# Patient Record
Sex: Female | Born: 1985
Health system: Southern US, Community
[De-identification: ages and names within clinical notes are randomized; demographics above are authoritative.]

## PROBLEM LIST (undated history)

## (undated) DIAGNOSIS — Z8742 Personal history of other diseases of the female genital tract: Secondary | ICD-10-CM

## (undated) DIAGNOSIS — Z803 Family history of malignant neoplasm of breast: Secondary | ICD-10-CM

## (undated) DIAGNOSIS — I499 Cardiac arrhythmia, unspecified: Secondary | ICD-10-CM

## (undated) DIAGNOSIS — R2242 Localized swelling, mass and lump, left lower limb: Secondary | ICD-10-CM

## (undated) DIAGNOSIS — R87629 Unspecified abnormal cytological findings in specimens from vagina: Secondary | ICD-10-CM

## (undated) DIAGNOSIS — Z808 Family history of malignant neoplasm of other organs or systems: Secondary | ICD-10-CM

## (undated) HISTORY — DX: Family history of malignant neoplasm of other organs or systems: Z80.8

## (undated) HISTORY — PX: MYRINGOTOMY: SUR874

## (undated) HISTORY — DX: Family history of malignant neoplasm of breast: Z80.3

## (undated) HISTORY — DX: Unspecified abnormal cytological findings in specimens from vagina: R87.629

## (undated) HISTORY — DX: Cardiac arrhythmia, unspecified: I49.9

## (undated) HISTORY — PX: COSMETIC SURGERY: SHX468

---
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
## 2005-10-24 ENCOUNTER — Emergency Department (HOSPITAL_COMMUNITY): Admission: EM | Admit: 2005-10-24 | Discharge: 2005-10-24 | Payer: Self-pay | Admitting: Family Medicine

## 2005-11-08 ENCOUNTER — Emergency Department (HOSPITAL_COMMUNITY): Admission: EM | Admit: 2005-11-08 | Discharge: 2005-11-08 | Payer: Self-pay | Admitting: Family Medicine

## 2006-03-20 ENCOUNTER — Emergency Department (HOSPITAL_COMMUNITY): Admission: EM | Admit: 2006-03-20 | Discharge: 2006-03-20 | Payer: Self-pay | Admitting: Family Medicine

## 2007-02-12 ENCOUNTER — Ambulatory Visit (HOSPITAL_COMMUNITY): Admission: RE | Admit: 2007-02-12 | Discharge: 2007-02-12 | Payer: Self-pay | Admitting: Family Medicine

## 2008-12-01 HISTORY — PX: ANTERIOR CRUCIATE LIGAMENT REPAIR: SHX115

## 2010-02-27 ENCOUNTER — Encounter: Admission: RE | Admit: 2010-02-27 | Discharge: 2010-02-27 | Payer: Self-pay | Admitting: Obstetrics and Gynecology

## 2011-08-07 ENCOUNTER — Inpatient Hospital Stay (INDEPENDENT_AMBULATORY_CARE_PROVIDER_SITE_OTHER)
Admission: RE | Admit: 2011-08-07 | Discharge: 2011-08-07 | Disposition: A | Discharge status: Home / Self Care | Payer: 59 | Source: Ambulatory Visit | Attending: Family Medicine | Admitting: Family Medicine

## 2011-08-07 DIAGNOSIS — H814 Vertigo of central origin: Secondary | ICD-10-CM

## 2011-08-07 LAB — POCT I-STAT, CHEM 8
BUN: 12 mg/dL (ref 6–23)
Calcium, Ion: 1.19 mmol/L (ref 1.12–1.32)
Chloride: 102 mEq/L (ref 96–112)
Creatinine, Ser: 0.9 mg/dL (ref 0.50–1.10)
Glucose, Bld: 91 mg/dL (ref 70–99)
HCT: 48 % — ABNORMAL HIGH (ref 36.0–46.0)
Hemoglobin: 16.3 g/dL — ABNORMAL HIGH (ref 12.0–15.0)
Potassium: 4.1 mEq/L (ref 3.5–5.1)
Sodium: 138 mEq/L (ref 135–145)
TCO2: 24 mmol/L (ref 0–100)

## 2011-08-07 LAB — T4: T4, Total: 10.3 ug/dL (ref 5.0–12.5)

## 2011-08-14 ENCOUNTER — Other Ambulatory Visit (HOSPITAL_COMMUNITY): Payer: Self-pay | Admitting: Family Medicine

## 2011-08-14 DIAGNOSIS — R42 Dizziness and giddiness: Secondary | ICD-10-CM

## 2011-08-19 ENCOUNTER — Ambulatory Visit (HOSPITAL_COMMUNITY)
Admission: RE | Admit: 2011-08-19 | Discharge: 2011-08-19 | Disposition: A | Discharge status: Home / Self Care | Payer: 59 | Source: Ambulatory Visit | Attending: Family Medicine | Admitting: Family Medicine

## 2011-08-19 DIAGNOSIS — R42 Dizziness and giddiness: Secondary | ICD-10-CM | POA: Insufficient documentation

## 2011-08-19 DIAGNOSIS — R110 Nausea: Secondary | ICD-10-CM | POA: Insufficient documentation

## 2011-09-16 ENCOUNTER — Ambulatory Visit (HOSPITAL_BASED_OUTPATIENT_CLINIC_OR_DEPARTMENT_OTHER)
Admission: RE | Admit: 2011-09-16 | Discharge: 2011-09-16 | Disposition: A | Discharge status: Home / Self Care | Payer: 59 | Source: Ambulatory Visit | Attending: Specialist | Admitting: Specialist

## 2011-09-16 DIAGNOSIS — X58XXXA Exposure to other specified factors, initial encounter: Secondary | ICD-10-CM | POA: Insufficient documentation

## 2011-09-16 DIAGNOSIS — S83006A Unspecified dislocation of unspecified patella, initial encounter: Secondary | ICD-10-CM | POA: Insufficient documentation

## 2011-09-16 DIAGNOSIS — M2390 Unspecified internal derangement of unspecified knee: Secondary | ICD-10-CM | POA: Insufficient documentation

## 2011-09-16 DIAGNOSIS — M2240 Chondromalacia patellae, unspecified knee: Secondary | ICD-10-CM | POA: Insufficient documentation

## 2011-09-16 HISTORY — PX: KNEE ARTHROSCOPY W/ LATERAL RELEASE: SHX1873

## 2011-09-16 LAB — POCT HEMOGLOBIN-HEMACUE: Hemoglobin: 13.6 g/dL (ref 12.0–15.0)

## 2011-09-16 LAB — POCT PREGNANCY, URINE: Preg Test, Ur: NEGATIVE

## 2011-10-03 NOTE — Op Note (Signed)
  NAMERAYGEN, LINQUIST              ACCOUNT NO.:  [id number]  MEDICAL RECORD NO.:  [id number]  LOCATION:                                 FACILITY:  PHYSICIAN:  Erasmo Leventhal, M.D.DATE OF BIRTH:  1986-11-04  DATE OF PROCEDURE:  09/16/2011 DATE OF DISCHARGE:                              OPERATIVE REPORT   PREOPERATIVE DIAGNOSIS:  Left knee patellofemoral tracking and possible chondromalacia.  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  PROCEDURE:  Left knee arthroplasty lateral release.  SURGEON:  Erasmo Leventhal, MD  ASSISTANT:  Jamelle Rushing, PA  ANESTHESIA:  Knee block with general.  ESTIMATED BLOOD LOSS:  Less than 10 cc.  DRAINS:  None.  COMPLICATIONS:  None.  TOURNIQUET TIME:  None.  DISPOSITION:  PACU, stable.  OPERATIVE DETAILS:  The patient and family were counseled in the holding area, the correct side was identified and marked appropriately.  Chart was reviewed and signed appropriately.  IV was started and block was administered by the anesthesiologist.  On the way to operating room, the antibiotics were given.  She also had TED hose applied to the involved leg for DVT prophylaxis.  In the OR, she was placed under general anesthesia.  The left lower extremity was placed in a thigh holder, prepped with DuraPrep and draped in sterile fashion.  Standard time-out was done and confirmed by all in the room.  Proximal medial, inferomedial, and inferolateral portals were established.  Diagnostic arthroscopy revealed patella was tilting laterally significantly and about 3 mm of mild subluxation.  She now seemed to be unstable laterally.  There was some grade 2 chondromalacia at the femoral trochlea and patella, but with no unstable fronds.  ACL graft appeared to be excellent from previous surgery.  Mid compartment was inspected __________ normal lateral meniscus.  After  identifying the pathology, an arthroscopic lateral release was performed __________ synovial capsule retinaculum given anatomic patellofemoral tracking, pump pressure was decreased and hemostasis was obtained.  The arthroscopic equipment was removed, the portal was closed with suture.  A 10 cc of 0.25% Sensorcaine with epinephrine was added to the skin and another 10 cc to the knee joint with 4 mL of morphine sulfate.  Sterile dressing was applied along the lateral borders with TED hose, ice pack and knee immobilizer.  She was awakened and taken out of the operating room to PACU in stable condition.  She will be stabilized in the PACU and discharged home.          ______________________________ Erasmo Leventhal, M.D.     RAC/MEDQ  D:  09/16/2011  T:  09/17/2011  Job:  [id number]  Electronically Signed by Eugenia Mcalpine M.D. on 10/03/2011 11:45:33 AM

## 2011-10-29 ENCOUNTER — Encounter: Payer: 59 | Admitting: Physical Therapy

## 2012-05-10 ENCOUNTER — Ambulatory Visit (INDEPENDENT_AMBULATORY_CARE_PROVIDER_SITE_OTHER): Payer: 59 | Admitting: Obstetrics and Gynecology

## 2012-05-10 ENCOUNTER — Encounter: Payer: Self-pay | Admitting: Obstetrics and Gynecology

## 2012-05-10 VITALS — BP 104/58 | Ht 68.0 in | Wt 120.0 lb

## 2012-05-10 DIAGNOSIS — R87610 Atypical squamous cells of undetermined significance on cytologic smear of cervix (ASC-US): Secondary | ICD-10-CM

## 2012-05-10 DIAGNOSIS — IMO0001 Reserved for inherently not codable concepts without codable children: Secondary | ICD-10-CM

## 2012-05-10 DIAGNOSIS — Z309 Encounter for contraceptive management, unspecified: Secondary | ICD-10-CM

## 2012-05-10 NOTE — Progress Notes (Signed)
Pt here for f/u for b/c. Working well. Also has questions regarding gardasil and having abnormal paps. States that last two pap's have came back abnormal.   followup visit: The patient is currently on her fourth cycle of oral contraceptive pills and has been doing well. She has no intermenstrual bleeding. She has had very light and predictable withdrawal bleeding at the appropriate time. She is experiencing no nausea vomiting headaches chest pain leg pain and has not had significant weight gain. The patient has had 2 consecutive annual Pap smears showing atypical squamous cells of undetermined significant with negative high-risk HPV.  BP 104/58  Ht 5\' 8"  (1.727 m)  Wt 120 lb (54.432 kg)  BMI 18.25 kg/m2  LMP 05/04/2012  Impression: Doing well on oral contraceptive pill ASCUS negative HPV  Recommendation Continue oral contraceptive pill Consider Gardisil vaccination as I recommend for all patients between the ages of 10 and 55 Followup for annual

## 2012-05-12 DIAGNOSIS — Z309 Encounter for contraceptive management, unspecified: Secondary | ICD-10-CM | POA: Insufficient documentation

## 2012-05-12 DIAGNOSIS — IMO0001 Reserved for inherently not codable concepts without codable children: Secondary | ICD-10-CM | POA: Insufficient documentation

## 2013-01-31 ENCOUNTER — Other Ambulatory Visit: Payer: Self-pay | Admitting: Obstetrics and Gynecology

## 2013-01-31 MED ORDER — LEVONORGESTREL-ETHINYL ESTRAD 0.1-20 MG-MCG PO TABS: 1.0000 | ORAL_TABLET | Freq: Every day | ORAL | Status: DC

## 2013-01-31 NOTE — Telephone Encounter (Signed)
Tc to pt regarding msg left on vm that she is needing a refill of her BCPs and has an AEX scheduled for 02/24/13 w/ EP.  Lm on vm to call back.

## 2013-07-02 ENCOUNTER — Emergency Department (HOSPITAL_BASED_OUTPATIENT_CLINIC_OR_DEPARTMENT_OTHER): Payer: 59

## 2013-07-02 ENCOUNTER — Emergency Department (HOSPITAL_BASED_OUTPATIENT_CLINIC_OR_DEPARTMENT_OTHER)
Admission: EM | Admit: 2013-07-02 | Discharge: 2013-07-02 | Disposition: A | Discharge status: Home / Self Care | Payer: 59 | Attending: Emergency Medicine | Admitting: Emergency Medicine

## 2013-07-02 ENCOUNTER — Encounter (HOSPITAL_BASED_OUTPATIENT_CLINIC_OR_DEPARTMENT_OTHER): Payer: Self-pay | Admitting: *Deleted

## 2013-07-02 DIAGNOSIS — R05 Cough: Secondary | ICD-10-CM | POA: Insufficient documentation

## 2013-07-02 DIAGNOSIS — J209 Acute bronchitis, unspecified: Secondary | ICD-10-CM | POA: Insufficient documentation

## 2013-07-02 DIAGNOSIS — J40 Bronchitis, not specified as acute or chronic: Secondary | ICD-10-CM

## 2013-07-02 DIAGNOSIS — Z8742 Personal history of other diseases of the female genital tract: Secondary | ICD-10-CM | POA: Insufficient documentation

## 2013-07-02 DIAGNOSIS — R059 Cough, unspecified: Secondary | ICD-10-CM | POA: Insufficient documentation

## 2013-07-02 MED ORDER — HYDROCOD POLST-CHLORPHEN POLST 10-8 MG/5ML PO LQCR: 5.0000 mL | Freq: Two times a day (BID) | ORAL | Status: DC

## 2013-07-02 MED ORDER — AZITHROMYCIN 250 MG PO TABS: ORAL_TABLET | ORAL | Status: DC

## 2013-07-02 NOTE — ED Provider Notes (Signed)
CSN: [id number]     Arrival date & time 07/02/13  1545 History     First MD Initiated Contact with Patient 07/02/13 1613     Chief Complaint  Patient presents with  . Shortness of Breath   (Consider location/radiation/quality/duration/timing/severity/associated sxs/prior Treatment) Patient is a 27 y.o. female presenting with shortness of breath. The history is provided by the patient. No language interpreter was used.  Shortness of Breath Severity:  Moderate Onset quality:  Gradual Duration:  4 days Timing:  Constant Progression:  Worsening Chronicity:  New Relieved by:  Nothing Worsened by:  Nothing tried Ineffective treatments:  None tried Associated symptoms: cough     Past Medical History  Diagnosis Date  . Abnormal Pap smear   . Mastodynia   . Ovarian cyst    Past Surgical History  Procedure Laterality Date  . Tonsilectomy, adenoidectomy, bilateral myringotomy and tubes    . Anterior cruciate ligament repair    . Knee arthroscopy w/ lateral release     Family History  Problem Relation Age of Onset  . Cancer Maternal Grandmother     breast  . Cancer Maternal Grandfather     melanoma  . Hypertension Father   . Anemia Mother   . Hypertension Brother    History  Substance Use Topics  . Smoking status: Never Smoker   . Smokeless tobacco: Never Used  . Alcohol Use: No   OB History   Grav Para Term Preterm Abortions TAB SAB Ect Mult Living   0 0 0 0 0 0 0 0 0 0      Review of Systems  Respiratory: Positive for cough and shortness of breath.   All other systems reviewed and are negative.    Allergies  Review of patient's allergies indicates no known allergies.  Home Medications   Current Outpatient Rx  Name  Route  Sig  Dispense  Refill  . AMBULATORY NON FORMULARY MEDICATION      Medication Name: otc hydroxy-cut         . levonorgestrel-ethinyl estradiol (AVIANE,ALESSE,LESSINA) 0.1-20 MG-MCG tablet   Oral   Take 1 tablet by mouth daily.   1  Package   0    BP 115/76  Pulse 100  Temp(Src) 98.7 F (37.1 C) (Oral)  Resp 18  Ht 5\' 8"  (1.727 m)  Wt 130 lb (58.968 kg)  BMI 19.77 kg/m2  SpO2 100%  LMP 06/26/2013 Physical Exam  Nursing note and vitals reviewed. Constitutional: She is oriented to person, place, and time. She appears well-developed and well-nourished.  HENT:  Head: Normocephalic.  Right Ear: External ear normal.  Left Ear: External ear normal.  Mouth/Throat: Oropharynx is clear and moist.  Eyes: Pupils are equal, round, and reactive to light.  Neck: Normal range of motion.  Cardiovascular: Normal rate.   Pulmonary/Chest: Effort normal and breath sounds normal.  Abdominal: Soft. Bowel sounds are normal.  Musculoskeletal: Normal range of motion.  Neurological: She is alert and oriented to person, place, and time. She has normal reflexes.  Skin: Skin is warm.  Psychiatric: She has a normal mood and affect.    ED Course   Procedures (including critical care time)  Labs Reviewed - No data to display Dg Chest 2 View  07/02/2013   *RADIOLOGY REPORT*  Clinical Data: Mid chest pain, shortness of breath  CHEST - 2 VIEW  Comparison: None.  Findings: Lungs are clear. No pleural effusion or pneumothorax.  Cardiomediastinal silhouette is within normal limits.  Visualized osseous  structures are within normal limits.  IMPRESSION: No evidence of acute cardiopulmonary disease.   Original Report Authenticated By: Charline Bills, M.D.   1. Bronchitis     MDM  z pack and tussionex  Elson Areas, PA-C 07/02/13 1752  Lonia Skinner [place], [place] 07/02/13 1752

## 2013-07-02 NOTE — ED Notes (Signed)
Patient states that she has been short of breath for about 4 days. Mild cough, lightheaded.

## 2013-07-02 NOTE — ED Provider Notes (Signed)
Medical screening examination/treatment/procedure(s) were performed by non-physician practitioner and as supervising physician I was immediately available for consultation/collaboration.  Shann Lewellyn, MD 07/02/13 1837 

## 2013-07-02 NOTE — ED Notes (Signed)
Patient transported to X-ray 

## 2013-10-20 ENCOUNTER — Ambulatory Visit: Payer: 59 | Attending: Specialist | Admitting: Physical Therapy

## 2013-10-20 DIAGNOSIS — M25569 Pain in unspecified knee: Secondary | ICD-10-CM | POA: Insufficient documentation

## 2013-10-20 DIAGNOSIS — IMO0001 Reserved for inherently not codable concepts without codable children: Secondary | ICD-10-CM | POA: Insufficient documentation

## 2013-10-24 ENCOUNTER — Ambulatory Visit: Payer: 59

## 2013-10-26 ENCOUNTER — Ambulatory Visit: Payer: 59 | Admitting: Physical Therapy

## 2013-10-31 ENCOUNTER — Ambulatory Visit: Payer: 59 | Attending: Specialist | Admitting: Physical Therapy

## 2013-10-31 DIAGNOSIS — M25569 Pain in unspecified knee: Secondary | ICD-10-CM | POA: Insufficient documentation

## 2013-10-31 DIAGNOSIS — IMO0001 Reserved for inherently not codable concepts without codable children: Secondary | ICD-10-CM | POA: Insufficient documentation

## 2013-11-02 ENCOUNTER — Ambulatory Visit: Payer: 59 | Admitting: Physical Therapy

## 2013-11-09 ENCOUNTER — Ambulatory Visit: Payer: 59 | Admitting: Physical Therapy

## 2013-11-16 ENCOUNTER — Other Ambulatory Visit (HOSPITAL_COMMUNITY): Payer: Self-pay | Admitting: Specialist

## 2013-11-16 DIAGNOSIS — M25562 Pain in left knee: Secondary | ICD-10-CM

## 2013-11-22 ENCOUNTER — Encounter (HOSPITAL_BASED_OUTPATIENT_CLINIC_OR_DEPARTMENT_OTHER): Payer: Self-pay | Admitting: Emergency Medicine

## 2013-11-22 ENCOUNTER — Emergency Department (HOSPITAL_BASED_OUTPATIENT_CLINIC_OR_DEPARTMENT_OTHER)
Admission: EM | Admit: 2013-11-22 | Discharge: 2013-11-22 | Disposition: A | Discharge status: Home / Self Care | Payer: 59 | Attending: Emergency Medicine | Admitting: Emergency Medicine

## 2013-11-22 ENCOUNTER — Emergency Department (HOSPITAL_BASED_OUTPATIENT_CLINIC_OR_DEPARTMENT_OTHER): Payer: 59

## 2013-11-22 DIAGNOSIS — W268XXA Contact with other sharp object(s), not elsewhere classified, initial encounter: Secondary | ICD-10-CM | POA: Insufficient documentation

## 2013-11-22 DIAGNOSIS — Y9389 Activity, other specified: Secondary | ICD-10-CM | POA: Insufficient documentation

## 2013-11-22 DIAGNOSIS — Z79899 Other long term (current) drug therapy: Secondary | ICD-10-CM | POA: Insufficient documentation

## 2013-11-22 DIAGNOSIS — S61219A Laceration without foreign body of unspecified finger without damage to nail, initial encounter: Secondary | ICD-10-CM

## 2013-11-22 DIAGNOSIS — S61209A Unspecified open wound of unspecified finger without damage to nail, initial encounter: Secondary | ICD-10-CM | POA: Insufficient documentation

## 2013-11-22 DIAGNOSIS — Z8742 Personal history of other diseases of the female genital tract: Secondary | ICD-10-CM | POA: Insufficient documentation

## 2013-11-22 DIAGNOSIS — Y929 Unspecified place or not applicable: Secondary | ICD-10-CM | POA: Insufficient documentation

## 2013-11-22 MED ORDER — HYDROCODONE-ACETAMINOPHEN 5-325 MG PO TABS
2.0000 | ORAL_TABLET | Freq: Once | ORAL | Status: AC
  Administered 2013-11-22: 2 via ORAL
  Filled 2013-11-22: qty 2

## 2013-11-22 MED ORDER — HYDROCODONE-ACETAMINOPHEN 5-325 MG PO TABS: 2.0000 | ORAL_TABLET | ORAL | Status: DC | PRN

## 2013-11-22 MED ORDER — HYDROXYZINE HCL 25 MG PO TABS: 25.0000 mg | ORAL_TABLET | Freq: Four times a day (QID) | ORAL | Status: DC

## 2013-11-22 NOTE — ED Notes (Signed)
Pt reports sliced tip of right middle fingertip with mandolin.

## 2013-11-22 NOTE — ED Provider Notes (Addendum)
CSN: [id number]     Arrival date & time 11/22/13  1541 History   First MD Initiated Contact with Patient 11/22/13 1633     Chief Complaint  Patient presents with  . Laceration   (Consider location/radiation/quality/duration/timing/severity/associated sxs/prior Treatment) Patient is a 27 y.o. female presenting with skin laceration. The history is provided by the patient. No language interpreter was used.  Laceration Location:  Finger Finger laceration location:  R middle finger Length (cm):  8mm round flap Quality: avulsion   Bleeding: controlled   Injury mechanism: mandolin. Pain details:    Quality:  Aching   Severity:  Moderate Foreign body present:  No foreign bodies Relieved by:  Nothing Worsened by:  Nothing tried Tetanus status:  Up to date Pt complains of a laceration to distal finger, flap intact on palmer aspect,  Flap has good color  Past Medical History  Diagnosis Date  . Abnormal Pap smear   . Mastodynia   . Ovarian cyst    Past Surgical History  Procedure Laterality Date  . Tonsilectomy, adenoidectomy, bilateral myringotomy and tubes    . Anterior cruciate ligament repair    . Knee arthroscopy w/ lateral release     Family History  Problem Relation Age of Onset  . Cancer Maternal Grandmother     breast  . Cancer Maternal Grandfather     melanoma  . Hypertension Father   . Anemia Mother   . Hypertension Brother    History  Substance Use Topics  . Smoking status: Never Smoker   . Smokeless tobacco: Never Used  . Alcohol Use: No   OB History   Grav Para Term Preterm Abortions TAB SAB Ect Mult Living   0 0 0 0 0 0 0 0 0 0      Review of Systems  All other systems reviewed and are negative.    Allergies  Review of patient's allergies indicates no known allergies.  Home Medications   Current Outpatient Rx  Name  Route  Sig  Dispense  Refill  . etonogestrel-ethinyl estradiol (NUVARING) 0.12-0.015 MG/24HR vaginal ring   Vaginal   Place 1  each vaginally every 28 (twenty-eight) days. Insert vaginally and leave in place for 3 consecutive weeks, then remove for 1 week.         . AMBULATORY NON FORMULARY MEDICATION      Medication Name: otc hydroxy-cut         . azithromycin (ZITHROMAX Z-PAK) 250 MG tablet      Two tablets first day then one a day   6 tablet   0   . chlorpheniramine-HYDROcodone (TUSSIONEX PENNKINETIC ER) 10-8 MG/5ML LQCR   Oral   Take 5 mLs by mouth every 12 (twelve) hours.   140 mL   0   . levonorgestrel-ethinyl estradiol (AVIANE,ALESSE,LESSINA) 0.1-20 MG-MCG tablet   Oral   Take 1 tablet by mouth daily.   1 Package   0    BP 132/71  Pulse 85  Temp(Src) 98.2 F (36.8 C) (Oral)  Resp 18  Ht 5\' 8"  (1.727 m)  Wt 130 lb (58.968 kg)  BMI 19.77 kg/m2  SpO2 100%  LMP 11/08/2013 Physical Exam  Nursing note and vitals reviewed. Constitutional: She is oriented to person, place, and time. She appears well-developed and well-nourished.  HENT:  Head: Normocephalic.  Eyes: Pupils are equal, round, and reactive to light.  Musculoskeletal: She exhibits tenderness.  8mm flap laceration distal finger gapping  Neurological: She is alert and oriented to person,  place, and time. She has normal reflexes.  Skin: Skin is warm.  Psychiatric: She has a normal mood and affect.    ED Course  LACERATION REPAIR Date/Time: 11/22/2013 9:31 PM Performed by: Elson Areas Authorized by: Elson Areas Consent: Verbal consent obtained. Consent given by: patient Patient identity confirmed: verbally with patient Laceration length: 0.8 cm Contamination: The wound is contaminated. Foreign bodies: no foreign bodies Tendon involvement: none Nerve involvement: none Vascular damage: no Preparation: Patient was prepped and draped in the usual sterile fashion. Irrigation solution: saline Amount of cleaning: standard Debridement: none Skin closure: 5-0 Prolene Number of sutures: 3 Technique:  simple Approximation: close Approximation difficulty: simple Patient tolerance: Patient tolerated the procedure well with no immediate complications. Comments: Pt counseled on viability,  Advised that flap will scab over if blood flow not sufficient   (including critical care time) Labs Review Labs Reviewed - No data to display Imaging Review Dg Finger Middle Right  11/22/2013   CLINICAL DATA:  Distal right middle finger laceration  EXAM: RIGHT MIDDLE FINGER 2+V  COMPARISON:  None.  FINDINGS: Soft tissue irregularity and defect in the distal aspect of the right middle finger. No fracture or radiopaque foreign body.  IMPRESSION: Distal soft tissue laceration without fracture or radiopaque foreign body.   Electronically Signed   By: Gordan Payment M.D.   On: 11/22/2013 16:51    EKG Interpretation   None       MDM   1. Laceration of finger, right, initial encounter    Splint, rx for hydrocodone.   Suture removal in 8 days   Elson Areas, PA-C 11/22/13 2132  Lonia Skinner [place], [place] 12/07/13 [phone]

## 2013-11-23 NOTE — ED Provider Notes (Signed)
Medical screening examination/treatment/procedure(s) were performed by non-physician practitioner and as supervising physician I was immediately available for consultation/collaboration.  EKG Interpretation   None         Junius Argyle, MD 11/23/13 1116

## 2013-11-25 ENCOUNTER — Ambulatory Visit (HOSPITAL_COMMUNITY)
Admission: RE | Admit: 2013-11-25 | Discharge: 2013-11-25 | Disposition: A | Discharge status: Home / Self Care | Payer: 59 | Source: Ambulatory Visit | Attending: Specialist | Admitting: Specialist

## 2013-11-25 DIAGNOSIS — M25569 Pain in unspecified knee: Secondary | ICD-10-CM | POA: Insufficient documentation

## 2013-11-25 DIAGNOSIS — Y831 Surgical operation with implant of artificial internal device as the cause of abnormal reaction of the patient, or of later complication, without mention of misadventure at the time of the procedure: Secondary | ICD-10-CM | POA: Insufficient documentation

## 2013-11-25 DIAGNOSIS — T84498A Other mechanical complication of other internal orthopedic devices, implants and grafts, initial encounter: Secondary | ICD-10-CM | POA: Insufficient documentation

## 2013-11-25 DIAGNOSIS — M25562 Pain in left knee: Secondary | ICD-10-CM

## 2013-12-09 NOTE — ED Provider Notes (Signed)
Medical screening examination/treatment/procedure(s) were performed by non-physician practitioner and as supervising physician I was immediately available for consultation/collaboration.  EKG Interpretation   None         Blanchard Kelch, MD 12/09/13 1054

## 2014-02-06 ENCOUNTER — Other Ambulatory Visit: Payer: Self-pay | Admitting: Orthopedic Surgery

## 2014-02-06 ENCOUNTER — Encounter (HOSPITAL_BASED_OUTPATIENT_CLINIC_OR_DEPARTMENT_OTHER): Payer: Self-pay | Admitting: *Deleted

## 2014-02-07 ENCOUNTER — Encounter (HOSPITAL_BASED_OUTPATIENT_CLINIC_OR_DEPARTMENT_OTHER): Payer: Self-pay | Admitting: *Deleted

## 2014-02-07 NOTE — Progress Notes (Signed)
NPO AFTER MN WITH EXCEPTION CLEAR LIQUIDS UNTIL 0830 (NO CREAM/ MILK PRODUCTS).  ARRIVE AT 1230. NEEDS HG AND URINE PREG.

## 2014-02-09 ENCOUNTER — Encounter (HOSPITAL_BASED_OUTPATIENT_CLINIC_OR_DEPARTMENT_OTHER): Payer: 59 | Admitting: Anesthesiology

## 2014-02-09 ENCOUNTER — Ambulatory Visit (HOSPITAL_BASED_OUTPATIENT_CLINIC_OR_DEPARTMENT_OTHER): Payer: 59 | Admitting: Anesthesiology

## 2014-02-09 ENCOUNTER — Ambulatory Visit (HOSPITAL_BASED_OUTPATIENT_CLINIC_OR_DEPARTMENT_OTHER)
Admission: RE | Admit: 2014-02-09 | Discharge: 2014-02-09 | Disposition: A | Discharge status: Home / Self Care | Payer: 59 | Source: Ambulatory Visit | Attending: Specialist | Admitting: Specialist

## 2014-02-09 ENCOUNTER — Encounter (HOSPITAL_BASED_OUTPATIENT_CLINIC_OR_DEPARTMENT_OTHER)
Admission: RE | Disposition: A | Discharge status: Home / Self Care | Payer: Self-pay | Source: Ambulatory Visit | Attending: Specialist

## 2014-02-09 ENCOUNTER — Encounter (HOSPITAL_BASED_OUTPATIENT_CLINIC_OR_DEPARTMENT_OTHER): Payer: Self-pay | Admitting: *Deleted

## 2014-02-09 DIAGNOSIS — G5790 Unspecified mononeuropathy of unspecified lower limb: Secondary | ICD-10-CM | POA: Insufficient documentation

## 2014-02-09 DIAGNOSIS — L923 Foreign body granuloma of the skin and subcutaneous tissue: Secondary | ICD-10-CM | POA: Insufficient documentation

## 2014-02-09 DIAGNOSIS — Z9889 Other specified postprocedural states: Secondary | ICD-10-CM

## 2014-02-09 DIAGNOSIS — R29898 Other symptoms and signs involving the musculoskeletal system: Secondary | ICD-10-CM | POA: Insufficient documentation

## 2014-02-09 DIAGNOSIS — Y838 Other surgical procedures as the cause of abnormal reaction of the patient, or of later complication, without mention of misadventure at the time of the procedure: Secondary | ICD-10-CM | POA: Insufficient documentation

## 2014-02-09 DIAGNOSIS — IMO0002 Reserved for concepts with insufficient information to code with codable children: Secondary | ICD-10-CM | POA: Insufficient documentation

## 2014-02-09 DIAGNOSIS — M25569 Pain in unspecified knee: Secondary | ICD-10-CM | POA: Insufficient documentation

## 2014-02-09 HISTORY — DX: Personal history of other diseases of the female genital tract: Z87.42

## 2014-02-09 HISTORY — PX: KNEE ARTHROSCOPY: SHX127

## 2014-02-09 HISTORY — DX: Localized swelling, mass and lump, left lower limb: R22.42

## 2014-02-09 LAB — POCT HEMOGLOBIN-HEMACUE: HEMOGLOBIN: 13.8 g/dL (ref 12.0–15.0)

## 2014-02-09 SURGERY — ARTHROSCOPY, KNEE
Anesthesia: General | Site: Knee | Laterality: Left

## 2014-02-09 MED ORDER — FENTANYL CITRATE 0.05 MG/ML IJ SOLN
INTRAMUSCULAR | Status: DC | PRN
Start: 2014-02-09 — End: 2014-02-09
  Administered 2014-02-09: 50 ug via INTRAVENOUS
  Administered 2014-02-09 (×3): 25 ug via INTRAVENOUS

## 2014-02-09 MED ORDER — METHOCARBAMOL 500 MG PO TABS: 500.0000 mg | ORAL_TABLET | Freq: Four times a day (QID) | ORAL | Status: DC | PRN

## 2014-02-09 MED ORDER — SODIUM CHLORIDE 0.9 % IR SOLN
Status: DC | PRN
  Administered 2014-02-09: 500 mL

## 2014-02-09 MED ORDER — MORPHINE SULFATE 4 MG/ML IJ SOLN
INTRAMUSCULAR | Status: AC
  Filled 2014-02-09: qty 1

## 2014-02-09 MED ORDER — LIDOCAINE HCL (CARDIAC) 20 MG/ML IV SOLN
INTRAVENOUS | Status: DC | PRN
  Administered 2014-02-09: 50 mg via INTRAVENOUS

## 2014-02-09 MED ORDER — CEPHALEXIN 500 MG PO CAPS: 500.0000 mg | ORAL_CAPSULE | Freq: Three times a day (TID) | ORAL | Status: DC

## 2014-02-09 MED ORDER — HYDROMORPHONE HCL 2 MG PO TABS
ORAL_TABLET | ORAL | Status: AC
  Filled 2014-02-09: qty 1

## 2014-02-09 MED ORDER — DEXAMETHASONE SODIUM PHOSPHATE 4 MG/ML IJ SOLN
INTRAMUSCULAR | Status: DC | PRN
  Administered 2014-02-09: 10 mg via INTRAVENOUS

## 2014-02-09 MED ORDER — FENTANYL CITRATE 0.05 MG/ML IJ SOLN
INTRAMUSCULAR | Status: AC
Start: 2014-02-09 — End: 2014-02-09
  Filled 2014-02-09: qty 2

## 2014-02-09 MED ORDER — LACTATED RINGERS IV SOLN
INTRAVENOUS | Status: DC
Start: 2014-02-09 — End: 2014-02-09
  Administered 2014-02-09 (×2): via INTRAVENOUS
  Filled 2014-02-09: qty 1000

## 2014-02-09 MED ORDER — FENTANYL CITRATE 0.05 MG/ML IJ SOLN
INTRAMUSCULAR | Status: AC
  Filled 2014-02-09: qty 4

## 2014-02-09 MED ORDER — PROMETHAZINE HCL 25 MG/ML IJ SOLN
INTRAMUSCULAR | Status: AC
  Filled 2014-02-09: qty 1

## 2014-02-09 MED ORDER — SODIUM CHLORIDE 0.9 % IV SOLN
INTRAVENOUS | Status: DC
  Filled 2014-02-09: qty 1000

## 2014-02-09 MED ORDER — LACTATED RINGERS IV SOLN
INTRAVENOUS | Status: DC
  Filled 2014-02-09: qty 1000

## 2014-02-09 MED ORDER — MIDAZOLAM HCL 2 MG/2ML IJ SOLN
INTRAMUSCULAR | Status: AC
  Filled 2014-02-09: qty 2

## 2014-02-09 MED ORDER — FENTANYL CITRATE 0.05 MG/ML IJ SOLN
25.0000 ug | INTRAMUSCULAR | Status: DC | PRN
  Administered 2014-02-09 (×2): 25 ug via INTRAVENOUS
  Filled 2014-02-09: qty 1

## 2014-02-09 MED ORDER — MIDAZOLAM HCL 5 MG/5ML IJ SOLN
INTRAMUSCULAR | Status: DC | PRN
  Administered 2014-02-09: 2 mg via INTRAVENOUS

## 2014-02-09 MED ORDER — PROPOFOL 10 MG/ML IV BOLUS
INTRAVENOUS | Status: DC | PRN
  Administered 2014-02-09: 200 mg via INTRAVENOUS
  Administered 2014-02-09: 50 mg via INTRAVENOUS

## 2014-02-09 MED ORDER — ACETAMINOPHEN 10 MG/ML IV SOLN
INTRAVENOUS | Status: DC | PRN
  Administered 2014-02-09: 1000 mg via INTRAVENOUS

## 2014-02-09 MED ORDER — PROMETHAZINE HCL 25 MG/ML IJ SOLN
6.2500 mg | INTRAMUSCULAR | Status: DC | PRN
  Administered 2014-02-09: 6.25 mg via INTRAVENOUS
  Filled 2014-02-09: qty 1

## 2014-02-09 MED ORDER — HYDROMORPHONE HCL 2 MG PO TABS
2.0000 mg | ORAL_TABLET | ORAL | Status: DC | PRN
  Filled 2014-02-09: qty 1

## 2014-02-09 MED ORDER — ONDANSETRON HCL 4 MG/2ML IJ SOLN
INTRAMUSCULAR | Status: DC | PRN
  Administered 2014-02-09: 4 mg via INTRAVENOUS

## 2014-02-09 MED ORDER — CEFAZOLIN SODIUM-DEXTROSE 2-3 GM-% IV SOLR
2.0000 g | INTRAVENOUS | Status: AC
  Administered 2014-02-09: 2 g via INTRAVENOUS
  Filled 2014-02-09: qty 50

## 2014-02-09 MED ORDER — KETOROLAC TROMETHAMINE 30 MG/ML IJ SOLN
INTRAMUSCULAR | Status: DC | PRN
  Administered 2014-02-09: 30 mg via INTRAVENOUS

## 2014-02-09 MED ORDER — HYDROMORPHONE HCL 2 MG PO TABS: 2.0000 mg | ORAL_TABLET | ORAL | Status: DC | PRN

## 2014-02-09 MED ORDER — POVIDONE-IODINE 7.5 % EX SOLN
Freq: Once | CUTANEOUS | Status: DC
  Filled 2014-02-09: qty 118

## 2014-02-09 MED ORDER — FENTANYL CITRATE 0.05 MG/ML IJ SOLN
INTRAMUSCULAR | Status: AC
  Filled 2014-02-09: qty 2

## 2014-02-09 SURGICAL SUPPLY — 71 items
BANDAGE ESMARK 6X9 LF (GAUZE/BANDAGES/DRESSINGS) ×1 IMPLANT
BANDAGE GAUZE ELAST BULKY 4 IN (GAUZE/BANDAGES/DRESSINGS) ×1 IMPLANT
BLADE 4.2CUDA (BLADE) IMPLANT
BLADE CUDA 5.5 (BLADE) ×1 IMPLANT
BLADE CUDA GRT WHITE 3.5 (BLADE) IMPLANT
BLADE SURG 10 STRL SS (BLADE) ×2 IMPLANT
BLADE SURG 15 STRL LF DISP TIS (BLADE) ×1 IMPLANT
BLADE SURG 15 STRL SS (BLADE) ×2
BNDG CMPR 9X6 STRL LF SNTH (GAUZE/BANDAGES/DRESSINGS) ×1
BNDG ESMARK 6X9 LF (GAUZE/BANDAGES/DRESSINGS) ×2
BNDG GAUZE ELAST 4 BULKY (GAUZE/BANDAGES/DRESSINGS) ×4 IMPLANT
BUR OVAL 6.0 (BURR) IMPLANT
BUR VERTEX HOODED 4.5 (BURR) ×1 IMPLANT
CANISTER SUCT LVC 12 LTR MEDI- (MISCELLANEOUS) ×2 IMPLANT
CANISTER SUCTION 1200CC (MISCELLANEOUS) ×2 IMPLANT
CANISTER SUCTION 2500CC (MISCELLANEOUS) IMPLANT
CLOTH BEACON ORANGE TIMEOUT ST (SAFETY) ×2 IMPLANT
COVER TABLE BACK 60X90 (DRAPES) ×2 IMPLANT
DRAPE ARTHROSCOPY W/POUCH 114 (DRAPES) ×1 IMPLANT
DRAPE INCISE IOBAN 66X45 STRL (DRAPES) ×2 IMPLANT
DRAPE LG THREE QUARTER DISP (DRAPES) ×4 IMPLANT
DRAPE OEC MINIVIEW 54X84 (DRAPES) ×2 IMPLANT
DRAPE U-SHAPE 47X51 STRL (DRAPES) ×2 IMPLANT
DURAPREP 26ML APPLICATOR (WOUND CARE) ×2 IMPLANT
ELECT REM PT RETURN 9FT ADLT (ELECTROSURGICAL) ×2
ELECTRODE REM PT RTRN 9FT ADLT (ELECTROSURGICAL) ×1 IMPLANT
GAUZE XEROFORM 1X8 LF (GAUZE/BANDAGES/DRESSINGS) ×2 IMPLANT
GLOVE BIO SURGEON STRL SZ7.5 (GLOVE) ×3 IMPLANT
GLOVE INDICATOR 7.5 STRL GRN (GLOVE) ×2 IMPLANT
GLOVE INDICATOR 8.0 STRL GRN (GLOVE) ×4 IMPLANT
GLOVE SURG ORTHO 8.0 STRL STRW (GLOVE) ×2 IMPLANT
GOWN PREVENTION PLUS LG XLONG (DISPOSABLE) ×1 IMPLANT
GOWN STRL REIN XL XLG (GOWN DISPOSABLE) ×2 IMPLANT
GOWN STRL REUS W/ TWL XL LVL3 (GOWN DISPOSABLE) IMPLANT
GOWN STRL REUS W/TWL XL LVL3 (GOWN DISPOSABLE) ×6
IMMOBILIZER KNEE 22 UNIV (SOFTGOODS) IMPLANT
IV NS IRRIG 3000ML ARTHROMATIC (IV SOLUTION) ×4 IMPLANT
KIT TRANSTIBIAL (DISPOSABLE) ×1 IMPLANT
KNEE WRAP E Z 3 GEL PACK (MISCELLANEOUS) ×2 IMPLANT
MINI VAC (SURGICAL WAND) IMPLANT
NEEDLE HYPO 22GX1.5 SAFETY (NEEDLE) IMPLANT
PACK ARTHROSCOPY DSU (CUSTOM PROCEDURE TRAY) ×2 IMPLANT
PACK BASIN DAY SURGERY FS (CUSTOM PROCEDURE TRAY) ×2 IMPLANT
PAD ABD 8X10 STRL (GAUZE/BANDAGES/DRESSINGS) ×4 IMPLANT
PADDING CAST ABS 4INX4YD NS (CAST SUPPLIES) ×1
PADDING CAST ABS COTTON 4X4 ST (CAST SUPPLIES) ×1 IMPLANT
PENCIL BUTTON HOLSTER BLD 10FT (ELECTRODE) ×2 IMPLANT
SET ARTHROSCOPY TUBING (MISCELLANEOUS)
SET ARTHROSCOPY TUBING LN (MISCELLANEOUS) ×1 IMPLANT
SET PAD KNEE POSITIONER (MISCELLANEOUS) ×2 IMPLANT
SPONGE GAUZE 4X4 12PLY (GAUZE/BANDAGES/DRESSINGS) ×4 IMPLANT
SPONGE GAUZE 4X4 12PLY STER LF (GAUZE/BANDAGES/DRESSINGS) ×1 IMPLANT
SPONGE LAP 4X18 X RAY DECT (DISPOSABLE) ×4 IMPLANT
STRIP CLOSURE SKIN 1/2X4 (GAUZE/BANDAGES/DRESSINGS) ×2 IMPLANT
SUCTION FRAZIER TIP 10 FR DISP (SUCTIONS) ×2 IMPLANT
SUT 2 FIBERLOOP 20 STRT BLUE (SUTURE)
SUT ETHILON 4 0 PS 2 18 (SUTURE) ×2 IMPLANT
SUT FIBERWIRE #2 38 T-5 BLUE (SUTURE)
SUT MNCRL AB 3-0 PS2 18 (SUTURE) ×2 IMPLANT
SUT MNCRL AB 4-0 PS2 18 (SUTURE) ×1 IMPLANT
SUT VIC AB 0 CT1 36 (SUTURE) ×4 IMPLANT
SUT VIC AB 0 CT2 27 (SUTURE) ×10 IMPLANT
SUT VIC AB 2-0 CT1 27 (SUTURE) ×4
SUT VIC AB 2-0 CT1 TAPERPNT 27 (SUTURE) ×1 IMPLANT
SUTURE 2 FIBERLOOP 20 STRT BLU (SUTURE) ×4 IMPLANT
SUTURE FIBERWR #2 38 T-5 BLUE (SUTURE) IMPLANT
SYR CONTROL 10ML LL (SYRINGE) IMPLANT
TOWEL OR 17X24 6PK STRL BLUE (TOWEL DISPOSABLE) ×2 IMPLANT
WAND 30 DEG SABER W/CORD (SURGICAL WAND) IMPLANT
WAND 90 DEG TURBOVAC W/CORD (SURGICAL WAND) IMPLANT
WATER STERILE IRR 500ML POUR (IV SOLUTION) ×1 IMPLANT

## 2014-02-09 NOTE — Transfer of Care (Addendum)
Immediate Anesthesia Transfer of Care Note  Patient: Betty Long INOMVEH  Procedure(s) Performed: Procedure(s) (LRB): LEFT KNEE EXCISIONAL BIOPSY (Left)  Patient Location: PACU  Anesthesia Type: General  Level of Consciousness: awake, alert  and oriented  Airway & Oxygen Therapy: Patient Spontanous Breathing and Patient connected to nasal cannula oxygen  Post-op Assessment: Report given to PACU RN and Post -op Vital signs reviewed and stable  Post vital signs: Reviewed and stable  Complications: No apparent anesthesia complications

## 2014-02-09 NOTE — Anesthesia Postprocedure Evaluation (Signed)
  Anesthesia Post-op Note  Patient: Betty Long GBEEFEO  Procedure(s) Performed: Procedure(s) (LRB): LEFT KNEE EXCISIONAL BIOPSY (Left)  Patient Location: PACU  Anesthesia Type: General  Level of Consciousness: awake and alert   Airway and Oxygen Therapy: Patient Spontanous Breathing  Post-op Pain: mild  Post-op Assessment: Post-op Vital signs reviewed, Patient's Cardiovascular Status Stable, Respiratory Function Stable, Patent Airway and No signs of Nausea or vomiting  Last Vitals:  Filed Vitals:   02/09/14 1615  BP: 116/74  Pulse: 82  Temp:   Resp: 10    Post-op Vital Signs: stable   Complications: No apparent anesthesia complications

## 2014-02-09 NOTE — H&P (Signed)
Betty Long is an 28 y.o. female.   Chief Complaint: Left knee pain HPI: Patient presents to day to the [facility] surgery center. She has a history of left knee Lateral release and ACL reconstruction. She reports a mass and neuropathic pain at her proximal tibia. Denies any injury. Despite conservative treatment her discomfort continues. Surgical intervention has been discussed in detail. She wishes to proceed, as consented. Denies CP, SOB, or Fever/chills.  Past Medical History  Diagnosis Date  . Mass of left knee   . History of abnormal cervical Pap smear     ASCUS  ---  NEGATIVE HPV    Past Surgical History  Procedure Laterality Date  . Anterior cruciate ligament repair Left 2010  . Knee arthroscopy w/ lateral release Left 09-16-2011    PATELLA MALTRACKING  . Tonsillectomy  1993    Family History  Problem Relation Age of Onset  . Cancer Maternal Grandmother     breast  . Cancer Maternal Grandfather     melanoma  . Hypertension Father   . Anemia Mother   . Hypertension Brother    Social History:  reports that she has never smoked. She has never used smokeless tobacco. She reports that she does not drink alcohol or use illicit drugs.  Allergies:  Allergies  Allergen Reactions  . Percocet [Oxycodone-Acetaminophen] Itching  . Vicodin [Hydrocodone-Acetaminophen] Itching    Medications Prior to Admission  Medication Sig Dispense Refill  . acetaminophen (TYLENOL) 325 MG tablet Take 650 mg by mouth every 6 (six) hours as needed.      Marland Kitchen ibuprofen (ADVIL,MOTRIN) 200 MG tablet Take 200 mg by mouth every 6 (six) hours as needed.      . Multiple Vitamin (MULTIVITAMIN) tablet Take 1 tablet by mouth daily.        Results for orders placed during the hospital encounter of 02/09/14 (from the past 48 hour(s))  POCT HEMOGLOBIN-HEMACUE     Status: None   Collection Time    02/09/14  1:33 PM      Result Value Ref Range   Hemoglobin 13.8  12.0 - 15.0 g/dL   No results  found.  Review of Systems  Constitutional: Negative.   HENT: Negative.   Eyes: Negative.   Respiratory: Negative.   Cardiovascular: Negative.   Gastrointestinal: Negative.   Genitourinary: Negative.   Musculoskeletal: Negative.   Skin: Negative.   Neurological: Negative.   Endo/Heme/Allergies: Negative.   Psychiatric/Behavioral: Negative.     Blood pressure 111/71, pulse 85, temperature 97.4 F (36.3 C), temperature source Oral, resp. rate 16, height 5\' 8"  (1.727 m), weight 58.514 kg (129 lb), last menstrual period 01/30/2014, SpO2 99.00%. Physical Exam  Constitutional: She is oriented to person, place, and time. She appears well-developed.  HENT:  Head: Normocephalic.  Eyes: EOM are normal.  Neck: Normal range of motion.  Cardiovascular: Normal rate, regular rhythm, normal heart sounds and intact distal pulses.   Respiratory: Effort normal and breath sounds normal.  GI: Soft.  Genitourinary:  Deferred  Musculoskeletal: She exhibits edema and tenderness.  Proximal tibia  Neurological: She is alert and oriented to person, place, and time.  Skin: Skin is warm and dry.  Psychiatric: Her behavior is normal.     Assessment/Plan Left Knee mass: Open Excision of a mass D/c home today Follow D/c instructions Follow up in the office in 7 days.   Daine Croker L 02/09/2014, 2:17 PM

## 2014-02-09 NOTE — Op Note (Signed)
Dictated# [id number]

## 2014-02-09 NOTE — Interval H&P Note (Signed)
History and Physical Interval Note:  02/09/2014 2:38 PM  Betty Long  has presented today for surgery, with the diagnosis of LEFT KNEE MASS  The various methods of treatment have been discussed with the patient and family. After consideration of risks, benefits and other options for treatment, the patient has consented to  Procedure(s): LEFT KNEE EXCISIONAL BIOPSY (Left) as a surgical intervention .  The patient's history has been reviewed, patient examined, no change in status, stable for surgery.  I have reviewed the patient's chart and labs.  Questions were answered to the patient's satisfaction.     Saulo Anthis ANDREW

## 2014-02-09 NOTE — Anesthesia Preprocedure Evaluation (Signed)
Anesthesia Evaluation  Patient identified by MRN, date of birth, ID band Patient awake    Reviewed: Allergy & Precautions, H&P , NPO status , Patient's Chart, lab work & pertinent test results  Airway Mallampati: II  TM Distance: >3 FB Neck ROM: full    Dental no notable dental hx. (+) Teeth Intact, Dental Advisory Given   Pulmonary neg pulmonary ROS,  breath sounds clear to auscultation  Pulmonary exam normal       Cardiovascular Exercise Tolerance: Good negative cardio ROS  Rhythm:regular Rate:Normal     Neuro/Psych negative neurological ROS  negative psych ROS   GI/Hepatic negative GI ROS, Neg liver ROS,   Endo/Other  negative endocrine ROS  Renal/GU negative Renal ROS  negative genitourinary   Musculoskeletal   Abdominal   Peds  Hematology negative hematology ROS (+)   Anesthesia Other Findings   Reproductive/Obstetrics negative OB ROS                             Anesthesia Physical Anesthesia Plan  ASA: I  Anesthesia Plan: General   Post-op Pain Management:    Induction: Intravenous  Airway Management Planned: LMA  Additional Equipment:   Intra-op Plan:   Post-operative Plan:   Informed Consent: I have reviewed the patients History and Physical, chart, labs and discussed the procedure including the risks, benefits and alternatives for the proposed anesthesia with the patient or authorized representative who has indicated his/her understanding and acceptance.   Dental Advisory Given  Plan Discussed with: CRNA and Surgeon  Anesthesia Plan Comments:         Anesthesia Quick Evaluation  

## 2014-02-09 NOTE — Discharge Instructions (Signed)

## 2014-02-09 NOTE — H&P (View-Only) (Signed)
NPO AFTER MN WITH EXCEPTION CLEAR LIQUIDS UNTIL 0830 (NO CREAM/ MILK PRODUCTS).  ARRIVE AT 1230. NEEDS HG AND URINE PREG. 

## 2014-02-10 ENCOUNTER — Encounter (HOSPITAL_BASED_OUTPATIENT_CLINIC_OR_DEPARTMENT_OTHER): Payer: Self-pay | Admitting: Specialist

## 2014-02-10 NOTE — Op Note (Signed)
NAME:  Betty Long, Betty Long             ACCOUNT NO.:  [id number]  MEDICAL RECORD NO.:  [id number]  LOCATION:                                 FACILITY:  PHYSICIAN:  Cynda Familia, M.D.DATE OF BIRTH:  11-23-1986  DATE OF PROCEDURE: DATE OF DISCHARGE:  02/09/2014                              OPERATIVE REPORT   PREOPERATIVE DIAGNOSIS:  Left proximal medial leg, common knee, painful mass.  POSTOPERATIVE DIAGNOSIS:  Left proximal medial leg, common knee, painful mass.  PROCEDURES:  Left proximal leg, medial knee excisional biopsy of mass, probable retained suture, granulation tissue and foreign body reaction.  SURGEON:  Cynda Familia, M.D.  ASSISTANT:  Wyatt Portela, PA-C  ANESTHESIA:  General.  ESTIMATED BLOOD LOSS:  Minimal.  DRAINS:  None.  COMPLICATIONS:  None.  TOURNIQUET TIME:  45 minutes at 250 mmHg.  SPECIMENS:  Sent for pathologic review.  OPERATIVE DETAILS:  The patient was counseled in the holding area. Correct site was identified, and marked and signed appropriately.  She was then taken to the operating room where she was placed under general anesthesia.  All extremities were well padded.  Left lower extremity was elevated, prepped with DuraPrep, draped into a sterile fashion.  Time- out was done, confirmed the left side, exsanguinated with an Esmarch and tourniquet was inflated to 250 mmHg.  The previous incision was across the medial leg from previous anterior cruciate ligament reconstruction. The dissection was made to the old scar, the skin and subcutaneous tissue.  At this time, we dissected down open to the tibial tunnel, and at that point in time, this mass could be identified.  It was excised in its entirety.  It appeared to be hard mass, which was mostly again could be a foreign body reaction to retain sutures.  There were several areas of suture, which were removed, nonabsorbable type suture, and there was also distal portion, which was  removed.  The screw was not prominent nor was of any malignant type of findings, this was all completely benign in appearance.  After completely removing all old suture and the mass, area was copiously irrigated.  Hemostasis was obtained.  The tibial cortex was nice and flush.  There was no other prominent areas.  In addition to the entire area that was carefully evaluated, there was no entrapped nerves in this region as the dissection was meticulously looking for that.  Wound was again irrigated.  The subcu was closed in 2 layers with Vicryl, the skin with a subcuticular Monocryl suture.  After this, 30 mL of 0.25% Sensorcaine with epinephrine was injected into the wound and the posteromedial tibial and periosteum for pain relief as a local field block.  Sterile dressing was applied to the knee and leg.  Tourniquet was deflated.  She had normal circulation.  Compressive wrap was applied and ice pack.  She was then awakened, taken from the operating room to PACU in stable condition.  She will be stabilized in PACU and discharged to home.  To help with patient positioning, prepping, draping, technical and surgical assistance throughout the entire case, assistance in wound closure, Mr. Wyatt Portela, PA-C's assistance was needed.  ______________________________ Cynda Familia, M.D.     RAC/MEDQ  D:  02/09/2014  T:  02/10/2014  Job:  [id number]

## 2014-04-05 ENCOUNTER — Ambulatory Visit (INDEPENDENT_AMBULATORY_CARE_PROVIDER_SITE_OTHER): Payer: 59 | Admitting: *Deleted

## 2014-04-05 DIAGNOSIS — Z23 Encounter for immunization: Secondary | ICD-10-CM

## 2014-04-05 MED ORDER — MEASLES, MUMPS & RUBELLA VAC ~~LOC~~ INJ
0.5000 mL | INJECTION | Freq: Once | SUBCUTANEOUS | Status: AC
  Administered 2014-04-05: 0.5 mL via SUBCUTANEOUS

## 2014-07-13 LAB — OB RESULTS CONSOLE ANTIBODY SCREEN: Antibody Screen: NEGATIVE

## 2014-07-13 LAB — OB RESULTS CONSOLE HIV ANTIBODY (ROUTINE TESTING): HIV: NONREACTIVE

## 2014-07-13 LAB — OB RESULTS CONSOLE RUBELLA ANTIBODY, IGM: RUBELLA: IMMUNE

## 2014-07-13 LAB — OB RESULTS CONSOLE ABO/RH: RH Type: POSITIVE

## 2014-07-13 LAB — OB RESULTS CONSOLE HEPATITIS B SURFACE ANTIGEN: Hepatitis B Surface Ag: NEGATIVE

## 2014-07-13 LAB — OB RESULTS CONSOLE GC/CHLAMYDIA
Chlamydia: NEGATIVE
Gonorrhea: NEGATIVE

## 2014-07-13 LAB — OB RESULTS CONSOLE RPR: RPR: NONREACTIVE

## 2014-12-01 NOTE — L&D Delivery Note (Signed)
Delivery Note Pt reached complete dilation and pushed for about 1 1/2 hour and at 6:33 PM a healthy female was delivered via Vaginal, Spontaneous Delivery (Presentation: Left Occiput Anterior).  APGAR: 9, 9; weight pending.   Placenta status: Intact, Spontaneous.  Cord: 3 vessels with the following complications: Nuchal x 1--reduced  Pt noted to have a partial third degree laceration and sphincter repaired in overlapping fashion.  Rectal at end of repair confirmed integrity.  Anesthesia: Epidural  Episiotomy: None Lacerations: Partial 3rd degree;Perineal Suture Repair: 0 and 2-0 vicryl on the rectal sphincter and 3-0 vicryl rapide on remainder. Est. Blood Loss (mL): 400cc  Mom to postpartum.  Baby to Couplet care / Skin to Skin.  Logan Bores 01/26/2015, 7:36 PM

## 2015-01-02 LAB — OB RESULTS CONSOLE GBS: STREP GROUP B AG: POSITIVE

## 2015-01-25 ENCOUNTER — Encounter (HOSPITAL_COMMUNITY): Payer: Self-pay | Admitting: *Deleted

## 2015-01-25 ENCOUNTER — Telehealth (HOSPITAL_COMMUNITY): Payer: Self-pay | Admitting: *Deleted

## 2015-01-25 NOTE — Telephone Encounter (Signed)
Preadmission screen  

## 2015-01-25 NOTE — H&P (Signed)
Brittni Hult is a 29 y.o. female G1P0 at 31 5/7 weeks (EDD 01/28/15 by LMP c/w 8 week [place]) presenting for IOL at term with favorable cervix.  Prenatal care uncomplicated except GBS positive.  Maternal Medical History:  Contractions: Frequency: irregular.   Perceived severity is mild.    Fetal activity: Perceived fetal activity is normal.    Prenatal Complications - Diabetes: none.    OB History    Gravida Para Term Preterm AB TAB SAB Ectopic Multiple Living   1 0 0 0 0 0 0 0 0 0      Past Medical History  Diagnosis Date  . Mass of left knee   . History of abnormal cervical Pap smear     ASCUS  ---  NEGATIVE HPV  . Vaginal Pap smear, abnormal    Past Surgical History  Procedure Laterality Date  . Anterior cruciate ligament repair Left 2010  . Knee arthroscopy w/ lateral release Left 09-16-2011    PATELLA MALTRACKING  . Tonsillectomy  1993  . Knee arthroscopy Left 02/09/2014    Procedure: LEFT KNEE EXCISIONAL BIOPSY;  Surgeon: Sydnee Cabal, MD;  Location: [facility];  Service: Orthopedics;  Laterality: Left;  Marland Kitchen Myringotomy      24mos 6 mos  . Cosmetic surgery      laceration repair   Family History: family history includes Anemia in her mother; Cancer in her maternal grandfather and maternal grandmother; Hypertension in her brother and father. Social History:  reports that she has never smoked. She has never used smokeless tobacco. She reports that she does not drink alcohol or use illicit drugs.   Prenatal Transfer Tool  Maternal Diabetes: No Genetic Screening: Normal Maternal Ultrasounds/Referrals: Normal Fetal Ultrasounds or other Referrals:  None Maternal Substance Abuse:  No Significant Maternal Medications:  None Significant Maternal Lab Results:  Lab values include: Group B Strep positive Other Comments:  None  ROS    Last menstrual period 04/23/2014. Maternal Exam:  Uterine Assessment: Contraction strength is mild.  Contraction  frequency is irregular.   Abdomen: Patient reports no abdominal tenderness. Fetal presentation: vertex  Introitus: Normal vulva. Normal vagina.    Physical Exam  Constitutional: She appears well-developed and well-nourished.  Cardiovascular: Normal rate and regular rhythm.   Respiratory: Effort normal.  GI: Soft. Bowel sounds are normal.  Genitourinary: Vagina normal.  Neurological: She is alert.  Psychiatric: She has a normal mood and affect.    Prenatal labs: ABO, Rh: A/Positive/-- (08/13 0000) Antibody: Negative (08/13 0000) Rubella: Immune (08/13 0000) RPR: Nonreactive (08/13 0000)  HBsAg: Negative (08/13 0000)  HIV: Non-reactive (08/13 0000)  GBS: Positive (02/02 0000)  First trimester screen and AFP WNL One hour GTT 65 CF negative  Assessment/Plan: Pt for IOL at term with favorable cervix.  Plan PCN for +GBS and pitocin and AROM.     Logan Bores 01/25/2015, 9:47 PM

## 2015-01-26 ENCOUNTER — Inpatient Hospital Stay (HOSPITAL_COMMUNITY): Payer: 59 | Admitting: Anesthesiology

## 2015-01-26 ENCOUNTER — Encounter (HOSPITAL_COMMUNITY): Payer: Self-pay

## 2015-01-26 ENCOUNTER — Inpatient Hospital Stay (HOSPITAL_COMMUNITY): Admission: RE | Admit: 2015-01-26 | Payer: 59 | Source: Ambulatory Visit

## 2015-01-26 ENCOUNTER — Inpatient Hospital Stay (HOSPITAL_COMMUNITY)
Admission: RE | Admit: 2015-01-26 | Discharge: 2015-01-28 | DRG: 989 | Disposition: A | Discharge status: Home / Self Care | Payer: 59 | Source: Ambulatory Visit | Attending: Obstetrics and Gynecology | Admitting: Obstetrics and Gynecology

## 2015-01-26 DIAGNOSIS — Z3A39 39 weeks gestation of pregnancy: Secondary | ICD-10-CM | POA: Diagnosis present

## 2015-01-26 DIAGNOSIS — O99824 Streptococcus B carrier state complicating childbirth: Secondary | ICD-10-CM | POA: Diagnosis present

## 2015-01-26 DIAGNOSIS — N858 Other specified noninflammatory disorders of uterus: Secondary | ICD-10-CM | POA: Diagnosis present

## 2015-01-26 LAB — RPR: RPR Ser Ql: NONREACTIVE

## 2015-01-26 LAB — CBC
HCT: 37.5 % (ref 36.0–46.0)
Hemoglobin: 12.5 g/dL (ref 12.0–15.0)
MCH: 29.8 pg (ref 26.0–34.0)
MCHC: 33.3 g/dL (ref 30.0–36.0)
MCV: 89.3 fL (ref 78.0–100.0)
Platelets: 231 10*3/uL (ref 150–400)
RBC: 4.2 MIL/uL (ref 3.87–5.11)
RDW: 15 % (ref 11.5–15.5)
WBC: 9.9 10*3/uL (ref 4.0–10.5)

## 2015-01-26 LAB — TYPE AND SCREEN
ABO/RH(D): A POS
Antibody Screen: NEGATIVE

## 2015-01-26 LAB — ABO/RH: ABO/RH(D): A POS

## 2015-01-26 MED ORDER — OXYTOCIN 40 UNITS IN LACTATED RINGERS INFUSION - SIMPLE MED
1.0000 m[IU]/min | INTRAVENOUS | Status: DC
  Administered 2015-01-26: 2 m[IU]/min via INTRAVENOUS
  Filled 2015-01-26: qty 1000

## 2015-01-26 MED ORDER — DIBUCAINE 1 % RE OINT: 1.0000 "application " | TOPICAL_OINTMENT | RECTAL | Status: DC | PRN

## 2015-01-26 MED ORDER — PHENYLEPHRINE 40 MCG/ML (10ML) SYRINGE FOR IV PUSH (FOR BLOOD PRESSURE SUPPORT)
80.0000 ug | PREFILLED_SYRINGE | INTRAVENOUS | Status: DC | PRN
  Filled 2015-01-26: qty 2
  Filled 2015-01-26: qty 20

## 2015-01-26 MED ORDER — LANOLIN HYDROUS EX OINT: TOPICAL_OINTMENT | CUTANEOUS | Status: DC | PRN

## 2015-01-26 MED ORDER — PENICILLIN G POTASSIUM 5000000 UNITS IJ SOLR
5.0000 10*6.[IU] | Freq: Once | INTRAVENOUS | Status: AC
  Administered 2015-01-26: 5 10*6.[IU] via INTRAVENOUS
  Filled 2015-01-26: qty 5

## 2015-01-26 MED ORDER — BUTORPHANOL TARTRATE 1 MG/ML IJ SOLN
1.0000 mg | INTRAMUSCULAR | Status: DC | PRN
Start: 2015-01-26 — End: 2015-01-26
  Administered 2015-01-26: 1 mg via INTRAVENOUS
  Filled 2015-01-26: qty 1

## 2015-01-26 MED ORDER — ZOLPIDEM TARTRATE 5 MG PO TABS: 5.0000 mg | ORAL_TABLET | Freq: Every evening | ORAL | Status: DC | PRN

## 2015-01-26 MED ORDER — TERBUTALINE SULFATE 1 MG/ML IJ SOLN
0.2500 mg | Freq: Once | INTRAMUSCULAR | Status: DC | PRN
  Filled 2015-01-26: qty 1

## 2015-01-26 MED ORDER — ONDANSETRON HCL 4 MG PO TABS: 4.0000 mg | ORAL_TABLET | ORAL | Status: DC | PRN

## 2015-01-26 MED ORDER — TETANUS-DIPHTH-ACELL PERTUSSIS 5-2.5-18.5 LF-MCG/0.5 IM SUSP: 0.5000 mL | Freq: Once | INTRAMUSCULAR | Status: DC

## 2015-01-26 MED ORDER — HYDROCORTISONE 1 % EX CREA
1.0000 | TOPICAL_CREAM | Freq: Three times a day (TID) | CUTANEOUS | Status: DC | PRN
Start: 2015-01-26 — End: 2015-01-28
  Administered 2015-01-26 – 2015-01-27 (×3): 1 via TOPICAL
  Filled 2015-01-26 (×3): qty 28

## 2015-01-26 MED ORDER — ONDANSETRON HCL 4 MG/2ML IJ SOLN: 4.0000 mg | Freq: Four times a day (QID) | INTRAMUSCULAR | Status: DC | PRN

## 2015-01-26 MED ORDER — DIPHENHYDRAMINE HCL 25 MG PO CAPS: 25.0000 mg | ORAL_CAPSULE | Freq: Four times a day (QID) | ORAL | Status: DC | PRN

## 2015-01-26 MED ORDER — WITCH HAZEL-GLYCERIN EX PADS: 1.0000 "application " | MEDICATED_PAD | CUTANEOUS | Status: DC | PRN

## 2015-01-26 MED ORDER — LACTATED RINGERS IV SOLN: 500.0000 mL | INTRAVENOUS | Status: DC | PRN

## 2015-01-26 MED ORDER — CITRIC ACID-SODIUM CITRATE 334-500 MG/5ML PO SOLN: 30.0000 mL | ORAL | Status: DC | PRN

## 2015-01-26 MED ORDER — LIDOCAINE HCL (PF) 1 % IJ SOLN
30.0000 mL | INTRAMUSCULAR | Status: DC | PRN
  Filled 2015-01-26: qty 30

## 2015-01-26 MED ORDER — OXYTOCIN 40 UNITS IN LACTATED RINGERS INFUSION - SIMPLE MED
62.5000 mL/h | INTRAVENOUS | Status: DC
  Administered 2015-01-26: 62.5 mL/h via INTRAVENOUS

## 2015-01-26 MED ORDER — OXYCODONE-ACETAMINOPHEN 5-325 MG PO TABS: 2.0000 | ORAL_TABLET | ORAL | Status: DC | PRN

## 2015-01-26 MED ORDER — SIMETHICONE 80 MG PO CHEW: 80.0000 mg | CHEWABLE_TABLET | ORAL | Status: DC | PRN

## 2015-01-26 MED ORDER — ACETAMINOPHEN 325 MG PO TABS: 650.0000 mg | ORAL_TABLET | ORAL | Status: DC | PRN

## 2015-01-26 MED ORDER — FENTANYL 2.5 MCG/ML BUPIVACAINE 1/10 % EPIDURAL INFUSION (WH - ANES)
14.0000 mL/h | INTRAMUSCULAR | Status: DC | PRN
Start: 2015-01-26 — End: 2015-01-26

## 2015-01-26 MED ORDER — EPHEDRINE 5 MG/ML INJ
10.0000 mg | INTRAVENOUS | Status: DC | PRN
  Filled 2015-01-26: qty 2

## 2015-01-26 MED ORDER — BENZOCAINE-MENTHOL 20-0.5 % EX AERO
1.0000 "application " | INHALATION_SPRAY | CUTANEOUS | Status: DC | PRN
  Administered 2015-01-26: 1 via TOPICAL
  Filled 2015-01-26 (×2): qty 56

## 2015-01-26 MED ORDER — SENNOSIDES-DOCUSATE SODIUM 8.6-50 MG PO TABS
2.0000 | ORAL_TABLET | ORAL | Status: DC
  Administered 2015-01-26 – 2015-01-27 (×2): 2 via ORAL
  Filled 2015-01-26 (×2): qty 2

## 2015-01-26 MED ORDER — LIDOCAINE HCL (PF) 1 % IJ SOLN
INTRAMUSCULAR | Status: DC | PRN
  Administered 2015-01-26: 6 mL
  Administered 2015-01-26: 4 mL

## 2015-01-26 MED ORDER — DIPHENHYDRAMINE HCL 50 MG/ML IJ SOLN: 12.5000 mg | INTRAMUSCULAR | Status: DC | PRN

## 2015-01-26 MED ORDER — DIPHENHYDRAMINE HCL 25 MG PO CAPS: 50.0000 mg | ORAL_CAPSULE | Freq: Four times a day (QID) | ORAL | Status: DC | PRN

## 2015-01-26 MED ORDER — PENICILLIN G POTASSIUM 5000000 UNITS IJ SOLR
2.5000 10*6.[IU] | INTRAVENOUS | Status: DC
  Administered 2015-01-26 (×2): 2.5 10*6.[IU] via INTRAVENOUS
  Filled 2015-01-26 (×5): qty 2.5

## 2015-01-26 MED ORDER — ONDANSETRON HCL 4 MG/2ML IJ SOLN: 4.0000 mg | INTRAMUSCULAR | Status: DC | PRN

## 2015-01-26 MED ORDER — OXYCODONE-ACETAMINOPHEN 5-325 MG PO TABS
1.0000 | ORAL_TABLET | ORAL | Status: DC | PRN
  Administered 2015-01-26 – 2015-01-28 (×4): 1 via ORAL
  Filled 2015-01-26 (×4): qty 1

## 2015-01-26 MED ORDER — FENTANYL 2.5 MCG/ML BUPIVACAINE 1/10 % EPIDURAL INFUSION (WH - ANES)
14.0000 mL/h | INTRAMUSCULAR | Status: DC | PRN
  Administered 2015-01-26: 14 mL/h via EPIDURAL
  Filled 2015-01-26: qty 125

## 2015-01-26 MED ORDER — LACTATED RINGERS IV SOLN
500.0000 mL | Freq: Once | INTRAVENOUS | Status: AC
  Administered 2015-01-26: 500 mL via INTRAVENOUS

## 2015-01-26 MED ORDER — PHENYLEPHRINE 40 MCG/ML (10ML) SYRINGE FOR IV PUSH (FOR BLOOD PRESSURE SUPPORT)
80.0000 ug | PREFILLED_SYRINGE | INTRAVENOUS | Status: DC | PRN
  Filled 2015-01-26: qty 2

## 2015-01-26 MED ORDER — PRENATAL MULTIVITAMIN CH
1.0000 | ORAL_TABLET | Freq: Every day | ORAL | Status: DC
  Administered 2015-01-27 – 2015-01-28 (×2): 1 via ORAL
  Filled 2015-01-26 (×2): qty 1

## 2015-01-26 MED ORDER — LACTATED RINGERS IV SOLN
INTRAVENOUS | Status: DC
  Administered 2015-01-26 (×2): via INTRAVENOUS

## 2015-01-26 MED ORDER — OXYTOCIN BOLUS FROM INFUSION: 500.0000 mL | INTRAVENOUS | Status: DC

## 2015-01-26 MED ORDER — IBUPROFEN 600 MG PO TABS
600.0000 mg | ORAL_TABLET | Freq: Four times a day (QID) | ORAL | Status: DC
  Administered 2015-01-26 – 2015-01-28 (×7): 600 mg via ORAL
  Filled 2015-01-26 (×7): qty 1

## 2015-01-26 NOTE — Progress Notes (Signed)
Delivery of live viable female by Dr Marvel Plan, APGARS 9,9

## 2015-01-26 NOTE — Progress Notes (Signed)
Patient ID: Betty Long, female   DOB: 06-11-1986, 29 y.o.   MRN: [id number] Pt admitted and PCN on board for +GBS status afeb vss FHR Category 1  Cervix 70/2/-2 AROM clear  Pitocin at 2 mu, will follow progress. Pt not sure about epidural, will see how it goes.

## 2015-01-26 NOTE — Progress Notes (Signed)
Patient ID: Betty Long, female   DOB: [birth year], 29 y.o.   MRN: [id number] Pt coping with contractions, getting strong--ok for now Afeb vss FHR category 1 with occasional variables Cervix 80/5/-1 per RN exam at 1300pm  Pitocin at 10 mu  PCN on board for +GBS  Follow progress

## 2015-01-26 NOTE — Anesthesia Preprocedure Evaluation (Signed)

## 2015-01-26 NOTE — Anesthesia Procedure Notes (Signed)
Epidural Patient location during procedure: OB  Preanesthetic Checklist Completed: patient identified, site marked, surgical consent, pre-op evaluation, timeout performed, IV checked, risks and benefits discussed and monitors and equipment checked  Epidural Patient position: sitting Prep: site prepped and draped and DuraPrep Patient monitoring: continuous pulse ox and blood pressure Approach: midline Location: L3-L4 Injection technique: LOR air  Needle:  Needle type: Tuohy  Needle gauge: 17 G Needle length: 9 cm and 9 Needle insertion depth: 5 cm cm Catheter type: closed end flexible Catheter size: 19 Gauge Catheter at skin depth: 10 cm Test dose: negative  Assessment Events: blood not aspirated, injection not painful, no injection resistance, negative IV test and no paresthesia  Additional Notes Dosing of Epidural:  1st dose, through catheter ............................................Marland Kitchen  Xylocaine 40 mg  2nd dose, through catheter, after waiting 3 minutes........Marland KitchenXylocaine 60 mg    ( 1% Xylo charted as a single dose in Epic Meds for ease of charting; actual dosing was fractionated as above, for saftey's sake)  As each dose occurred, patient was free of IV sx; and patient exhibited no evidence of SA injection.  Patient is more comfortable after epidural dosed. Please see RN's note for documentation of vital signs,and FHR which are stable.  Patient reminded not to try to ambulate with numb legs, and that an RN must be present when she attempts to get up.

## 2015-01-27 ENCOUNTER — Encounter (HOSPITAL_COMMUNITY): Payer: Self-pay

## 2015-01-27 LAB — CBC
HCT: 32.2 % — ABNORMAL LOW (ref 36.0–46.0)
Hemoglobin: 10.8 g/dL — ABNORMAL LOW (ref 12.0–15.0)
MCH: 29.8 pg (ref 26.0–34.0)
MCHC: 33.5 g/dL (ref 30.0–36.0)
MCV: 88.7 fL (ref 78.0–100.0)
PLATELETS: 198 10*3/uL (ref 150–400)
RBC: 3.63 MIL/uL — AB (ref 3.87–5.11)
RDW: 15 % (ref 11.5–15.5)
WBC: 15.9 10*3/uL — AB (ref 4.0–10.5)

## 2015-01-27 NOTE — Lactation Note (Signed)
This note was copied from the chart of Betty Long. Lactation Consultation Note  Patient Name: Betty Long WCBJS'E Date: 01/27/2015 Reason for consult: Follow-up assessment Baby 27 hours of life. Mom reports that her nipples have been sore and she has had some bleeding. Mom's left nipple has a compression stripe and right nipple has a healing abrasion. Mom states that she has been rubbing EBM into nipples and they seem to be healing and feeling better. Assisted mom to latch baby to left breast in football position. Position stripe on left nipple appears to have happened when baby was held in cross-cradle position. Mom states football position feels more comfortable. Baby able to latch deeply, suckling rhythmically with lips flanged outward and a few swallows noted. However, baby only maintained latch for a few minutes before pulling off and crying. Baby latched again using NS this time and remained latched, suckling rhythmically with swallows heard for over 20 minutes. Colostrum was poole within NS and baby fussy to nurse longer.   Enc mom to nurse with cues, offer lots of STS, and attempt to latch baby without shield for some of the feedings. Discussed with mom that NS is a temporary device and it should become easier to latch baby without shield when her milk comes in. Mom aware of OP/BFSG and [place] phone line assistance after D/C. Mom states that this is the best feeding baby has had so far.   Enc mom to continue to use EBM on nipple, wear comfort gels for soothing nipples, and then shells to evert nipples. Demonstrated to mom how to compress breast when latching baby, and to continue to compress breasts while nursing to direct more flow to baby.   Mom will be getting employee [employer]" DEBP prior to D/C.   Maternal Data    Feeding Feeding Type: Breast Fed Length of feed:  (LC assessed first 20 minutes of BF. )  LATCH Score/Interventions Latch: Grasps breast easily, tongue down,  lips flanged, rhythmical sucking. Intervention(s): Adjust position;Assist with latch;Breast compression  Audible Swallowing: A few with stimulation Intervention(s): Skin to skin;Hand expression Intervention(s): Skin to skin;Hand expression;Alternate breast massage  Type of Nipple: Flat Intervention(s): Hand pump;Shells  Comfort (Breast/Nipple): Filling, red/small blisters or bruises, mild/mod discomfort  Problem noted: Cracked, bleeding, blisters, bruises Interventions  (Cracked/bleeding/bruising/blister): Expressed breast milk to nipple;Hand pump Interventions (Mild/moderate discomfort): Hand massage;Hand expression;Pre-pump if needed;Comfort gels;Breast shields  Hold (Positioning): Assistance needed to correctly position infant at breast and maintain latch. Intervention(s): Breastfeeding basics reviewed;Support Pillows;Position options;Skin to skin  LATCH Score: 6  Lactation Tools Discussed/Used Tools: Pump;Nipple Jefferson Fuel;Shells Nipple shield size: 20 Breast pump type: Manual   Consult Status Consult Status: Follow-up Date: 01/28/15 Follow-up type: In-patient    Inocente Salles 01/27/2015, 10:24 PM

## 2015-01-27 NOTE — Lactation Note (Signed)
This note was copied from the chart of Betty Long. Lactation Consultation Note: Initial visit with mom. Mom with positional scabs on both nipples. Reports left is the worse- was bleeding. Has been using NS given by RN. Baby waking- assisted mom with manual pump and few drops of Colostrum obtained  and placed on baby's lips. Baby on and off the breast- took a few good sucks and mom reports this feels better than yesterday. Baby off to sleep and visitors coming into room. Encouraged skin to skin. Breast shells with  instructions given. BF brochure given with resources for support after DC.. Mom is [employer] employee and wants to get pump before DC.  No questions at present. To page for assist prn,  Today's Date: 01/27/2015 Reason for consult: Initial assessment   Maternal Data Formula Feeding for Exclusion: No Has patient been taught Hand Expression?: Yes Does the patient have breastfeeding experience prior to this delivery?: No  Feeding Feeding Type: Breast Fed Length of feed: 10 min  LATCH Score/Interventions Latch: Repeated attempts needed to sustain latch, nipple held in mouth throughout feeding, stimulation needed to elicit sucking reflex.  Audible Swallowing: None  Type of Nipple: Flat Intervention(s): Hand pump;Shells  Comfort (Breast/Nipple): Filling, red/small blisters or bruises, mild/mod discomfort  Problem noted: Mild/Moderate discomfort Interventions (Mild/moderate discomfort): Comfort gels  Hold (Positioning): Assistance needed to correctly position infant at breast and maintain latch. Intervention(s): Breastfeeding basics reviewed;Support Pillows;Skin to skin  LATCH Score: 4  Lactation Tools Discussed/Used Tools: Nipple Jefferson Fuel;Shells Nipple shield size: 20   Consult Status Consult Status: Follow-up Date: 01/27/15 Follow-up type: In-patient    Truddie Crumble 01/27/2015, 10:01 AM

## 2015-01-27 NOTE — Progress Notes (Signed)
Post Partum Day 1 Subjective: tolerating PO  Pain controlled.  C/o one episode of bladder incontinence this AM, but thinks bladder just got too full.  No issues since  Objective: Blood pressure 112/48, pulse 98, temperature 97.9 F (36.6 C), temperature source Oral, resp. rate 18, height 5\' 8"  (1.727 m), weight 83.008 kg (183 lb), last menstrual period 04/23/2014, SpO2 98 %, unknown if currently breastfeeding.  Physical Exam:  General: alert and cooperative Lochia: appropriate Uterine Fundus: firm    Recent Labs  01/26/15 0722 01/27/15 0500  HGB 12.5 10.8*  HCT 37.5 32.2*    Assessment/Plan: Plan for discharge tomorrow   LOS: 1 day   Habeeb Puertas W 01/27/2015, 8:59 AM

## 2015-01-27 NOTE — Anesthesia Postprocedure Evaluation (Signed)
Anesthesia Post Note  Patient: Betty Long [facility]  Procedure(s) Performed: * No procedures listed *  Anesthesia type: Epidural  Patient location: Mother/Baby  Post pain: Pain level controlled  Post assessment: Post-op Vital signs reviewed  Last Vitals:  Filed Vitals:   01/27/15 0500  BP: 112/48  Pulse: 98  Temp: 36.6 C  Resp: 18    Post vital signs: Reviewed  Level of consciousness:alert  Complications: No apparent anesthesia complications

## 2015-01-28 ENCOUNTER — Inpatient Hospital Stay (HOSPITAL_COMMUNITY): Admission: AD | Admit: 2015-01-28 | Payer: 59 | Source: Ambulatory Visit | Admitting: Obstetrics and Gynecology

## 2015-01-28 MED ORDER — IBUPROFEN 600 MG PO TABS: 600.0000 mg | ORAL_TABLET | Freq: Four times a day (QID) | ORAL | Status: DC

## 2015-01-28 MED ORDER — OXYCODONE-ACETAMINOPHEN 5-325 MG PO TABS: 1.0000 | ORAL_TABLET | ORAL | Status: DC | PRN

## 2015-01-28 MED ORDER — DOCUSATE SODIUM 100 MG PO CAPS: 100.0000 mg | ORAL_CAPSULE | Freq: Two times a day (BID) | ORAL | Status: DC

## 2015-01-28 NOTE — Lactation Note (Addendum)
This note was copied from the chart of Betty Long. Lactation Consultation Note  Patient Name: Betty Lizmary Nader QGBEE'F Date: 01/28/2015 Reason for consult: Follow-up assessment  Baby is 52 hours old and has been breast feeding with #20 NS due to semi flat nipples . Mom has been using breast shells and nipples more erect. LC resized mom for #24 Nipple shield  And fits better than #20 NS , positional strips noted on both nipples , encouraged mom to use EBM to nipples  If to sore to apply NS ,after breast massage , hand express, pre-pump to make the nipple areola complex for  Elastic . LC reviewed application of NS to get the max amount nipple in NS. Baby latches well both breast  , worked on depth , swallows noted , increased with breast compressions.  Baby fed 1st breast 10 mins and released. Switched to the 2nd breast and fed 1st 5 mins , and then re- latched  Same side. Nipple was well rounded both sides when baby released. Sore nipple and engorgement prevention  and tx discussed . Referring to the baby and me booklet.  Mom was given comfort gels last night by LC. Mom has a DEBP for home and is aware extra pumping is indicated due to use of NS . Mom apart of the [facility] health Healthy pregnancy program and will be returning for [facility] O/P apt 3/4 Friday at 1pm , Apt reminder form given to mom.  Mother informed of post-discharge support and given phone number to the lactation department, including services for phone call assistance; out-patient appointments; and breastfeeding support group. List of other breastfeeding resources in the community given in the handout. Encouraged mother to call for problems or concerns related to breastfeeding.    Maternal Data    Feeding Feeding Type: Breast Fed Length of feed: 5 min  LATCH Score/Interventions Latch: Grasps breast easily, tongue down, lips flanged, rhythmical sucking. Intervention(s): Assist with latch;Adjust position;Breast  massage;Breast compression  Audible Swallowing: A few with stimulation  Type of Nipple: Everted at rest and after stimulation  Comfort (Breast/Nipple): Soft / non-tender     Hold (Positioning): Assistance needed to correctly position infant at breast and maintain latch. Intervention(s): Breastfeeding basics reviewed;Support Pillows;Position options;Skin to skin  LATCH Score: 8  Lactation Tools Discussed/Used Tools: Nipple Shields Nipple shield size: 24   Consult Status Consult Status: Follow-up Date: 02/02/15 (1pm ) Follow-up type: Out-patient    Myer Haff 01/28/2015, 11:54 AM

## 2015-01-28 NOTE — Discharge Summary (Signed)
Obstetric Discharge Summary Reason for Admission: induction of labor Prenatal Procedures: none Intrapartum Procedures: spontaneous vaginal delivery Postpartum Procedures: none Complications-Operative and Postpartum: third degree perineal laceration HEMOGLOBIN  Date Value Ref Range Status  01/27/2015 10.8* 12.0 - 15.0 g/dL Final   HCT  Date Value Ref Range Status  01/27/2015 32.2* 36.0 - 46.0 % Final    Physical Exam:  General: alert and cooperative Lochia: appropriate Uterine Fundus: firm   Discharge Diagnoses: Term Pregnancy-delivered  Discharge Information: Date: 01/28/2015 Activity: pelvic rest Diet: routine Medications: Ibuprofen and Percocet Condition: improved Instructions: refer to practice specific booklet Discharge to: home Follow-up Information    Follow up with Logan Bores, MD In 6 weeks.   Specialty:  Obstetrics and Gynecology   Why:  postpartum   Contact information:   [address] [address] [phone]       Newborn Data: Live born female  Birth Weight: 8 lb 2.3 oz (3695 g) APGAR: 9, 9  Home with mother.  Logan Bores 01/28/2015, 8:34 AM

## 2015-01-28 NOTE — Progress Notes (Signed)
Post Partum Day 1 Subjective: no complaints and tolerating PO Normal bladder control.  No BM yet  Objective: Blood pressure 107/65, pulse 103, temperature 97.3 F (36.3 C), temperature source Oral, resp. rate 18, height 5\' 8"  (1.727 m), weight 83.008 kg (183 lb), last menstrual period 04/23/2014, SpO2 100 %, unknown if currently breastfeeding.  Physical Exam:  General: alert and cooperative Lochia: appropriate Uterine Fundus: firm   Recent Labs  01/26/15 0722 01/27/15 0500  HGB 12.5 10.8*  HCT 37.5 32.2*    Assessment/Plan: Discharge home  Motrin and percocet   LOS: 2 days   Akasha Melena W 01/28/2015, 8:31 AM

## 2015-02-02 ENCOUNTER — Ambulatory Visit (HOSPITAL_COMMUNITY)
Admission: RE | Admit: 2015-02-02 | Discharge: 2015-02-02 | Disposition: A | Discharge status: Home / Self Care | Payer: 59 | Source: Ambulatory Visit | Attending: Obstetrics and Gynecology | Admitting: Obstetrics and Gynecology

## 2015-02-02 NOTE — Lactation Note (Signed)
Lactation Consult  Mother's reason for visit:  Newborn weight loss Visit Type:  OP Appointment Notes:  Baby is at greater than 10 percent weight loss.  She is still latching with a NS. She was 7 + 5 at the pediatrician on Monday.  She has lost weight since Monday. Today she latched easily and took a large amount of the shield into her mouth. She transferred 38 ml on the left breast and 32 ml on the right but mom's breast were still relatively full.   She had to be stimulated to continue suckling and mom used breast compression to help with transfer.  She was satisfied after this.  [name] has been eating 6-8 times a day. I advised 8-10 times in 24 hours, preferably 10. I noted a upper midline labial blister.  I did not do a full oral assessment because she transferred well. [facility] p[facility] to see at 4:15 today. Consult:  Follow-Up Lactation Consultant:  Van Clines  ________________________________________________________________________  [name] Name: Betty Long Date of Birth: 01/26/2015 Pediatrician: Albertina Parr Gender: female Gestational Age: [redacted]w[redacted]d (At Birth) Birth Weight: 8 lb 2.3 oz (3695 g) Weight at Discharge: Weight: 7 lb 9.2 oz (3435 g)Date of Discharge: 01/28/2015 [facility] Weights   01/26/15 1833 01/26/15 2320 01/28/15 0036  Weight: 8 lb 2.3 oz (3695 g) 8 lb 1.6 oz (3675 g) 7 lb 9.2 oz (3435 g)   Last weight taken from location [facility] of [facility]: 7+5 Weight today: 7+2.5 3246     ________________________________________________________________________  [name] ________________________________________________________________________    Infant Intake and Output Assessment  Voids:  6 in 24 hrs.  Color:  Clear yellow Stools:  4 in 24 hrs.  Color:  Green seedy  ________________________________________________________________________  Maternal Breast Assessment  Breast:  Full and Compressible Nipple:  Erect

## 2015-02-07 ENCOUNTER — Ambulatory Visit (HOSPITAL_COMMUNITY)
Admission: RE | Admit: 2015-02-07 | Discharge: 2015-02-07 | Disposition: A | Discharge status: Home / Self Care | Payer: 59 | Source: Ambulatory Visit | Attending: Obstetrics and Gynecology | Admitting: Obstetrics and Gynecology

## 2015-02-07 ENCOUNTER — Ambulatory Visit (HOSPITAL_COMMUNITY): Admission: RE | Admit: 2015-02-07 | Payer: 59 | Source: Ambulatory Visit

## 2015-02-07 NOTE — Lactation Note (Signed)
Lactation Consult  Mother's reason for visit:  Feeding assessment Visit Type: Feeding assessment Appointment Notes:  Weight loss Consult:  Follow-Up Lactation Consultant:  Ave Filter  ________________________________________________________________________    60 Name: Betty Long Date of Birth: 01/26/2015 [facility]ian: [facility] Gender: female Gestational Age: [redacted]w[redacted]d (At Birth) Birth Weight: 8 lb 2.3 oz (3695 g) Weight at Discharge: Weight: 7 lb 9.2 oz (3435 g)Date of Discharge: 01/28/2015 [facility] Weights   01/26/15 1833 01/26/15 2320 01/28/15 0036  Weight: 8 lb 2.3 oz (3695 g) 8 lb 1.6 oz (3675 g) 7 lb 9.2 oz (3435 g)   Last weight taken from location outside of [facility]: 7-6 on 02/05/15 Location:Pediatrician's office Weight today: 7-6.1    ________________________________________________________________________  [name] Type of delivery:  vaginal Breastfeeding Experience:  First time mom    ________________________________________________________________________  Breastfeeding History (Post Discharge)  Frequency of breastfeeding:  Every 2-3 hours Duration of feeding:  45-60 minutes  Supplementation  Breastmilk:  Volume 10-42ml Frequency:  2-3 times per day Total volume per day:  30-75ml  Method:  Syringe  Infant Intake and Output Assessment  Voids:  6+ in 24 hrs.  Color:  Clear yellow Stools:  6 in 24 hrs.  Color:  Yellow  ________________________________________________________________________  Maternal Breast Assessment  Breast:  Full Nipple:  Erect Pain level:  0 Pain interventions:  Bra  _______________________________________________________________________ Feeding Assessment/Evaluation Mom and 63 day old infant here for follow up visit.  Baby is gaining about 1 ounce per day.  Observed a good nutritive feeding today and baby transferred 84 mls.  Mom post pumps about 3 times  per day and obtains 60-90 mls.  She is giving baby 10-15 mls of expressed milk 2-3 times per day.  Plan is to continue feeding at least 8 times in 24 hours, post pump 3 times per day to protect milk supply(until back to birth weight), give baby 30 mls per feeding tube/syringe 3 times per day either at breast or finger feed.  Encouraged support groups.  Mom will call us for additional appointment as needed.  Initial feeding assessment:  Infant's oral assessment:  WNL  Positioning:  Cross cradle Right breast/left breast  LATCH documentation:  Latch:  2 = Grasps breast easily, tongue down, lips flanged, rhythmical sucking.  Audible swallowing:  2 = Spontaneous and intermittent  Type of nipple:  2 = Everted at rest and after stimulation  Comfort (Breast/Nipple):  2 = Soft / non-tender  Hold (Positioning):  2 = No assistance needed to correctly position infant at breast  LATCH score:  10  Attached assessment:  Deep  Lips flanged:  Yes.    Lips untucked:  No.  Suck assessment:  Nutritive  Tools:  Syringe with 5 Fr feeding tube Instructed on use and cleaning of tool:  Yes.    Pre-feed weight:  3348 g  Post-feed weight:  3432 gAmount transferred:  84 ml Amount supplemented:  0 ml      Total amount transferred:  84 ml Total supplement given:  0 ml

## 2015-02-16 ENCOUNTER — Ambulatory Visit (HOSPITAL_COMMUNITY)
Admission: RE | Admit: 2015-02-16 | Discharge: 2015-02-16 | Disposition: A | Discharge status: Home / Self Care | Payer: 59 | Source: Ambulatory Visit | Attending: Obstetrics and Gynecology | Admitting: Obstetrics and Gynecology

## 2015-02-16 NOTE — Lactation Note (Signed)
Lactation Consult  Mother's reason for visit:  Mom concerned about latch and milk transfer Visit Type:  Outpatient Appointment Notes:  Mom has been in for 2 outpatient visits, the last on 02/07/15. The baby nursed well at the 2 visits and transferred milk well according to [facility] notes. Mom took baby to Peds on Friday 02/09/15 for check up, baby's weight was 7 lb. 13.0 oz per Mom. During this week baby was fussy and not satisfied at the breast so parents took baby back to [facility] on Wednesday, 02/14/15 and baby had not gained any weight since previous Friday. Mom reports Peds concerned about baby's milk transfer at the breast using the nipple shield so Mom was instructed to BF every 2 hours without the nipple shield for 10 minutes each breast, supplement with 1-2 oz with each feeding. Post pump for 10-15 minutes after each feeding.  Consult:  Follow-Up Lactation Consultant:  Katrine Coho  ________________________________________________________________________   74 Name: Betty Long Date of Birth: 01/26/2015 Pediatrician: Harrie Jeans Gender: female Gestational Age: [redacted]w[redacted]d (At Birth) Birth Weight: 8 lb 2.3 oz (3695 g) Weight at Discharge: Weight: 7 lb 9.2 oz (3435 g)Date of Discharge: 01/28/2015 [facility] Weights   01/26/15 1833 01/26/15 2320 01/28/15 0036  Weight: 8 lb 2.3 oz (3695 g) 8 lb 1.6 oz (3675 g) 7 lb 9.2 oz (3435 g)   Last weight taken from location outside of [facility]: 02/14/15  7 lb. 13.0 oz Location:Pediatrician's office Weight today: 8 lb. 1.8 oz/3680 gm    ________________________________________________________________________  [name] Type of delivery:  SVB Breastfeeding Experience:  P1 Maternal Medical Conditions:  Denies History Maternal Medications:  PNV  ________________________________________________________________________  Breastfeeding History (Post Discharge)  Frequency of breastfeeding:   Since Wednesday every 2-3 hour Duration of feeding:  10 minutes each breast  Supplementation Breastmilk:  Volume 60 ml Frequency:  10 times/day Total volume per day:  653ml. Mom reports baby takes between 1-4 oz with feedings, on average 2 oz.   Method:  Bottle,   Pumping  Type of pump:  Medela pump in style Frequency:  After each feeding, every 2 hours Volume:  30-60 ml  Infant Intake and Output Assessment  Voids:  8 in 24 hrs.  Color:  Clear yellow Stools:  8 in 24 hrs.  Color:  Yellow  ________________________________________________________________________  Maternal Breast Assessment  Breast:  Soft Nipple:  Erect, short shaft, becomes more erect with stimulation, very compressible Pain level:  10 with initial latch. On left breast will decrease to 4, on right breast to 5 at this visit Pain interventions:  Expressed breast milk, comfort gels  _______________________________________________________________________ Feeding Assessment/Evaluation  Initial feeding assessment:  Infant's oral assessment:  Thick, short labial frenulum. Possible short, posterior frenulum. Baby has some tongue mobility, however with suck exam on LC finger baby humps her tongue at the hard palate. Consistent compression line on Mom's nipple with baby nursing without nipple shield in spite of what appears to be good depth. Mom continues to have pain with nursing.   Positioning:  Cross cradle Left breast  LATCH documentation:  Latch:  2 = Grasps breast easily, tongue down, lips flanged, rhythmical sucking.  Audible swallowing:  2 = Spontaneous and intermittent  Type of nipple:  2 = Everted at rest and after stimulation, short nipple shaft bilateral  Comfort (Breast/Nipple):  1 = Filling, red/small blisters or bruises, mild/mod discomfort  Hold (Positioning):  1 = Assistance needed to correctly position infant at breast  and maintain latch  LATCH score:  8  Attached assessment:  Deep, once lips  become untucked baby can obtain/sustain more depth  Lips flanged:  No. Demonstrated how to un-tuck upper/lower lip  Lips untucked:  Intermittently  Suck assessment:  Nutritive  Tools:  Comfort gels Instructed on use and cleaning of tool:  Yes.    Pre-feed weight:  3680 g  (8 lb. 1.8 oz.) Post-feed weight:  3726 g (8 lb. 3.4 oz.) Amount transferred:  46 ml  With nursing for 17 minutes. Baby lips need to be un-tucked intermittently during the feeding. Some nipple compression present at the end of the feeding. PS 10 with initial latch improving to 4 then 2 as the baby nursed.    Additional Feeding Assessment -   Infant's oral assessment:  Variance  Positioning:  Cross cradle Right breast  LATCH documentation:  Latch:  2 = Grasps breast easily, tongue down, lips flanged, rhythmical sucking.  Audible swallowing:  2 = Spontaneous and intermittent  Type of nipple:  2 = Everted at rest and after stimulation  Comfort (Breast/Nipple):  1 = Filling, red/small blisters or bruises, mild/mod discomfort  Hold (Positioning):  1 = Assistance needed to correctly position infant at breast and maintain latch  LATCH score:  8  Attached assessment:  Deep once lips were un-tucked  Lips flanged:  No. Reviewed again un-tucking upper/lower lip during the feeding.   Lips untucked:  Intermittently  Suck assessment:  Nutritive   Tools:  Comfort gels Instructed on use and cleaning of tool:  Yes.    Pre-feed weight:  3726 g  (8 lb. 3.4 oz.) Post-feed weight:  3754 g (8 lb. 4.5 oz.) Amount transferred:  28 ml with nursing for 15 minutes on right breast without the nipple shield. Compression line visible on nipple at the end of the feeding. PS 10-5 with baby nursing.  Applied #20 nipple shield to Mom's right breast. Baby latched but took few minutes and some adjustment for baby to obtain good depth with nipple shield. At the beginning of the feeding, baby was just at the base of the shaft of the nipple  shield.  Mom had less discomfort with nipple shield. Nipple was round when baby came off the breast.  Baby transferred another 2 ml. Mom had been using #24 nipple shield previously which was probably too large. Mom reports she did not always observe milk in the nipple shield when using the #24. We did see breast milk in the nipple shield with #20 and demonstrated to Mom how good depth with using the nipple shield should appear.   Total amount transferred:   76 ml Total supplement given:  0 ml Mom did not bring her pump and have EBM to supplement at this visit. She will supplement when she gets home. Did not want to give formula.  Plan discussed with Mom: BF on demand but at least every 2-3 hours. Keep baby nursing for 15 minutes both breasts each feeding. If soreness continues or nipple compression line does not improve with positioning suggestions today. Consider using #20 nipple shield. Be sure you see breast milk in the nipple shield with feedings. Post pump after feedings for 15 minutes - stop for 5 minutes and massage breast well - then pump an additional 5 minutes to maximize emptying breast. Supplement with each feeding - start with 2 oz unless baby is spitting up or will not take this volume. Be sure baby is getting 1-2 oz each feeding.  Use comfort gels and EBM for sore nipples. Information give on supplements to start to increase milk production. OP f/u scheduled for Thursday, 02/22/15 at 10:30.  [facility] RN coming Tuesday and Mom has appt with Peds on Wednesday 02/21/15.

## 2015-02-22 ENCOUNTER — Ambulatory Visit (HOSPITAL_COMMUNITY): Payer: 59

## 2016-03-13 MED FILL — OMEPRAZOLE DR 20 MG CAPSULE: 20 | 30 days supply | Qty: 30 | Fill #0

## 2016-03-13 MED FILL — VENTOLIN HFA 90 MCG INHALER: 108 (90 BAS | 16 days supply | Qty: 18 | Fill #0

## 2016-03-18 MED FILL — METHYLPREDNISOLONE 4 MG TAB: 4 | 6 days supply | Qty: 21 | Fill #0

## 2016-09-22 DIAGNOSIS — Z3202 Encounter for pregnancy test, result negative: Secondary | ICD-10-CM | POA: Diagnosis not present

## 2016-09-22 DIAGNOSIS — Z01419 Encounter for gynecological examination (general) (routine) without abnormal findings: Secondary | ICD-10-CM | POA: Diagnosis not present

## 2016-09-22 DIAGNOSIS — Z0000 Encounter for general adult medical examination without abnormal findings: Secondary | ICD-10-CM | POA: Diagnosis not present

## 2016-09-22 DIAGNOSIS — Z130 Encounter for screening for diseases of the blood and blood-forming organs and certain disorders involving the immune mechanism: Secondary | ICD-10-CM | POA: Diagnosis not present

## 2016-09-22 DIAGNOSIS — Z1389 Encounter for screening for other disorder: Secondary | ICD-10-CM | POA: Diagnosis not present

## 2016-09-22 DIAGNOSIS — Z6821 Body mass index (BMI) 21.0-21.9, adult: Secondary | ICD-10-CM | POA: Diagnosis not present

## 2016-12-01 NOTE — L&D Delivery Note (Signed)
Delivery Note Pt reached complete dilation and pushed great about 15 minutes.  At 4:37 PM a healthy female was delivered via Vaginal, Spontaneous (Presentation: OA  ).  APGAR: 9, 10; weight penidng .   Placenta status: delivered spontaneously .  Cord:  with the following complications: none .    Anesthesia:  Epidural and local 1% lidoaine block Episiotomy: None Lacerations:  Second degree Suture Repair: 2.0 3.0 vicryl rapide Est. Blood Loss (mL):  283mL  Mom to postpartum.  Baby to Couplet care / Skin to Skin. D/w pt circumcision and desires to proceed. Logan Bores 10/22/2017, 5:04 PM

## 2017-02-18 DIAGNOSIS — L812 Freckles: Secondary | ICD-10-CM | POA: Diagnosis not present

## 2017-02-18 DIAGNOSIS — D225 Melanocytic nevi of trunk: Secondary | ICD-10-CM | POA: Diagnosis not present

## 2017-02-18 DIAGNOSIS — D2271 Melanocytic nevi of right lower limb, including hip: Secondary | ICD-10-CM | POA: Diagnosis not present

## 2017-02-18 DIAGNOSIS — D485 Neoplasm of uncertain behavior of skin: Secondary | ICD-10-CM | POA: Diagnosis not present

## 2017-02-18 DIAGNOSIS — D2261 Melanocytic nevi of right upper limb, including shoulder: Secondary | ICD-10-CM | POA: Diagnosis not present

## 2017-02-18 DIAGNOSIS — D2262 Melanocytic nevi of left upper limb, including shoulder: Secondary | ICD-10-CM | POA: Diagnosis not present

## 2017-02-18 DIAGNOSIS — D2272 Melanocytic nevi of left lower limb, including hip: Secondary | ICD-10-CM | POA: Diagnosis not present

## 2017-03-05 DIAGNOSIS — N912 Amenorrhea, unspecified: Secondary | ICD-10-CM | POA: Diagnosis not present

## 2017-03-05 DIAGNOSIS — Z3201 Encounter for pregnancy test, result positive: Secondary | ICD-10-CM | POA: Diagnosis not present

## 2017-03-24 DIAGNOSIS — Z113 Encounter for screening for infections with a predominantly sexual mode of transmission: Secondary | ICD-10-CM | POA: Diagnosis not present

## 2017-03-24 DIAGNOSIS — Z3A09 9 weeks gestation of pregnancy: Secondary | ICD-10-CM | POA: Diagnosis not present

## 2017-03-24 DIAGNOSIS — Z3481 Encounter for supervision of other normal pregnancy, first trimester: Secondary | ICD-10-CM | POA: Diagnosis not present

## 2017-03-24 DIAGNOSIS — O26891 Other specified pregnancy related conditions, first trimester: Secondary | ICD-10-CM | POA: Diagnosis not present

## 2017-03-24 DIAGNOSIS — Z3689 Encounter for other specified antenatal screening: Secondary | ICD-10-CM | POA: Diagnosis not present

## 2017-03-24 DIAGNOSIS — R112 Nausea with vomiting, unspecified: Secondary | ICD-10-CM | POA: Diagnosis not present

## 2017-03-24 LAB — OB RESULTS CONSOLE ANTIBODY SCREEN: ANTIBODY SCREEN: NEGATIVE

## 2017-03-24 LAB — OB RESULTS CONSOLE HIV ANTIBODY (ROUTINE TESTING): HIV: NONREACTIVE

## 2017-03-24 LAB — OB RESULTS CONSOLE RPR: RPR: NONREACTIVE

## 2017-03-24 LAB — OB RESULTS CONSOLE ABO/RH: RH Type: POSITIVE

## 2017-03-24 LAB — OB RESULTS CONSOLE GC/CHLAMYDIA
Chlamydia: NEGATIVE
Gonorrhea: NEGATIVE

## 2017-03-24 LAB — OB RESULTS CONSOLE HEPATITIS B SURFACE ANTIGEN: HEP B S AG: NEGATIVE

## 2017-03-24 LAB — OB RESULTS CONSOLE RUBELLA ANTIBODY, IGM: RUBELLA: IMMUNE

## 2017-04-17 DIAGNOSIS — Z3481 Encounter for supervision of other normal pregnancy, first trimester: Secondary | ICD-10-CM | POA: Diagnosis not present

## 2017-04-17 DIAGNOSIS — Z3A12 12 weeks gestation of pregnancy: Secondary | ICD-10-CM | POA: Diagnosis not present

## 2017-04-17 DIAGNOSIS — Z3682 Encounter for antenatal screening for nuchal translucency: Secondary | ICD-10-CM | POA: Diagnosis not present

## 2017-04-17 DIAGNOSIS — N898 Other specified noninflammatory disorders of vagina: Secondary | ICD-10-CM | POA: Diagnosis not present

## 2017-04-20 MED FILL — TERCONAZOLE 0.4% VAG CREAM: 0.4 | 7 days supply | Qty: 45 | Fill #0

## 2017-04-20 MED FILL — metroNIDAZOLE 500 MG TABS: 500 | 7 days supply | Qty: 14 | Fill #0

## 2017-05-15 DIAGNOSIS — Z36 Encounter for antenatal screening for chromosomal anomalies: Secondary | ICD-10-CM | POA: Diagnosis not present

## 2017-06-01 DIAGNOSIS — Z3A19 19 weeks gestation of pregnancy: Secondary | ICD-10-CM | POA: Diagnosis not present

## 2017-06-01 DIAGNOSIS — Z363 Encounter for antenatal screening for malformations: Secondary | ICD-10-CM | POA: Diagnosis not present

## 2017-07-16 MED FILL — predniSONE 10 MG TABS: 10 | 10 days supply | Qty: 21 | Fill #0

## 2017-07-29 DIAGNOSIS — Z3689 Encounter for other specified antenatal screening: Secondary | ICD-10-CM | POA: Diagnosis not present

## 2017-08-04 MED FILL — PANTOPRAZOLE SOD DR 20 MG T: 20 | 30 days supply | Qty: 30 | Fill #0

## 2017-08-10 DIAGNOSIS — Z3483 Encounter for supervision of other normal pregnancy, third trimester: Secondary | ICD-10-CM | POA: Diagnosis not present

## 2017-08-10 DIAGNOSIS — Z3A29 29 weeks gestation of pregnancy: Secondary | ICD-10-CM | POA: Diagnosis not present

## 2017-08-10 DIAGNOSIS — Z23 Encounter for immunization: Secondary | ICD-10-CM | POA: Diagnosis not present

## 2017-08-26 ENCOUNTER — Other Ambulatory Visit: Payer: Self-pay | Admitting: Obstetrics and Gynecology

## 2017-08-26 DIAGNOSIS — N63 Unspecified lump in unspecified breast: Secondary | ICD-10-CM

## 2017-08-31 MED FILL — PANTOPRAZOLE SOD DR 20 MG T: 20 | 30 days supply | Qty: 30 | Fill #1

## 2017-09-04 ENCOUNTER — Ambulatory Visit
Admission: RE | Admit: 2017-09-04 | Discharge: 2017-09-04 | Disposition: A | Discharge status: Home / Self Care | Payer: 59 | Source: Ambulatory Visit | Attending: Obstetrics and Gynecology | Admitting: Obstetrics and Gynecology

## 2017-09-04 DIAGNOSIS — N6323 Unspecified lump in the left breast, lower outer quadrant: Secondary | ICD-10-CM | POA: Diagnosis not present

## 2017-09-04 DIAGNOSIS — N63 Unspecified lump in unspecified breast: Secondary | ICD-10-CM

## 2017-09-04 DIAGNOSIS — N6321 Unspecified lump in the left breast, upper outer quadrant: Secondary | ICD-10-CM | POA: Diagnosis not present

## 2017-09-21 MED FILL — PANTOPRAZOLE SOD DR 40 MG T: 40 | 30 days supply | Qty: 30 | Fill #0

## 2017-09-28 DIAGNOSIS — Z3685 Encounter for antenatal screening for Streptococcus B: Secondary | ICD-10-CM | POA: Diagnosis not present

## 2017-09-28 DIAGNOSIS — Z3483 Encounter for supervision of other normal pregnancy, third trimester: Secondary | ICD-10-CM | POA: Diagnosis not present

## 2017-09-28 DIAGNOSIS — Z3A36 36 weeks gestation of pregnancy: Secondary | ICD-10-CM | POA: Diagnosis not present

## 2017-09-28 LAB — OB RESULTS CONSOLE GBS: GBS: POSITIVE

## 2017-10-06 MED FILL — AMOX TR-K CLV 875-125 MG TA: 875-125 | 7 days supply | Qty: 14 | Fill #0

## 2017-10-12 MED FILL — AZITHROMYCIN 250 MG TAB: 250 | 5 days supply | Qty: 6 | Fill #0

## 2017-10-16 ENCOUNTER — Telehealth (HOSPITAL_COMMUNITY): Payer: Self-pay | Admitting: *Deleted

## 2017-10-16 ENCOUNTER — Encounter (HOSPITAL_COMMUNITY): Payer: Self-pay | Admitting: *Deleted

## 2017-10-16 NOTE — Telephone Encounter (Signed)
Preadmission screen  

## 2017-10-22 ENCOUNTER — Inpatient Hospital Stay (HOSPITAL_COMMUNITY)
Admission: AD | Admit: 2017-10-22 | Discharge: 2017-10-24 | DRG: 807 | Disposition: A | Discharge status: Home / Self Care | Payer: 59 | Source: Ambulatory Visit | Attending: Obstetrics and Gynecology | Admitting: Obstetrics and Gynecology

## 2017-10-22 ENCOUNTER — Encounter (HOSPITAL_COMMUNITY): Payer: Self-pay | Admitting: Anesthesiology

## 2017-10-22 ENCOUNTER — Inpatient Hospital Stay (HOSPITAL_COMMUNITY): Payer: 59 | Admitting: Anesthesiology

## 2017-10-22 ENCOUNTER — Encounter (HOSPITAL_COMMUNITY): Payer: Self-pay | Admitting: *Deleted

## 2017-10-22 DIAGNOSIS — Z3483 Encounter for supervision of other normal pregnancy, third trimester: Secondary | ICD-10-CM | POA: Diagnosis present

## 2017-10-22 DIAGNOSIS — Z3A39 39 weeks gestation of pregnancy: Secondary | ICD-10-CM | POA: Diagnosis not present

## 2017-10-22 DIAGNOSIS — O99824 Streptococcus B carrier state complicating childbirth: Secondary | ICD-10-CM | POA: Diagnosis present

## 2017-10-22 LAB — CBC
HCT: 41 % (ref 36.0–46.0)
Hemoglobin: 13 g/dL (ref 12.0–15.0)
MCH: 27.5 pg (ref 26.0–34.0)
MCHC: 31.7 g/dL (ref 30.0–36.0)
MCV: 86.9 fL (ref 78.0–100.0)
Platelets: 313 10*3/uL (ref 150–400)
RBC: 4.72 MIL/uL (ref 3.87–5.11)
RDW: 16 % — ABNORMAL HIGH (ref 11.5–15.5)
WBC: 12.6 10*3/uL — ABNORMAL HIGH (ref 4.0–10.5)

## 2017-10-22 LAB — TYPE AND SCREEN
ABO/RH(D): A POS
Antibody Screen: NEGATIVE

## 2017-10-22 MED ORDER — PRENATAL MULTIVITAMIN CH
1.0000 | ORAL_TABLET | Freq: Every day | ORAL | Status: DC
  Administered 2017-10-23 – 2017-10-24 (×2): 1 via ORAL
  Filled 2017-10-22 (×2): qty 1

## 2017-10-22 MED ORDER — LIDOCAINE HCL (PF) 1 % IJ SOLN
INTRAMUSCULAR | Status: DC | PRN
  Administered 2017-10-22: 7 mL via EPIDURAL
  Administered 2017-10-22: 5 mL via EPIDURAL

## 2017-10-22 MED ORDER — LIDOCAINE HCL (PF) 1 % IJ SOLN
30.0000 mL | INTRAMUSCULAR | Status: AC | PRN
  Administered 2017-10-22: 30 mL via SUBCUTANEOUS
  Filled 2017-10-22: qty 30

## 2017-10-22 MED ORDER — EPHEDRINE 5 MG/ML INJ
10.0000 mg | INTRAVENOUS | Status: DC | PRN
  Filled 2017-10-22: qty 2

## 2017-10-22 MED ORDER — FENTANYL 2.5 MCG/ML BUPIVACAINE 1/10 % EPIDURAL INFUSION (WH - ANES)
14.0000 mL/h | INTRAMUSCULAR | Status: DC | PRN
  Administered 2017-10-22: 14 mL/h via EPIDURAL

## 2017-10-22 MED ORDER — BUPIVACAINE HCL (PF) 0.25 % IJ SOLN
INTRAMUSCULAR | Status: DC | PRN
  Administered 2017-10-22: 10 mL via EPIDURAL

## 2017-10-22 MED ORDER — OXYTOCIN BOLUS FROM INFUSION
500.0000 mL | Freq: Once | INTRAVENOUS | Status: AC
  Administered 2017-10-22: 500 mL via INTRAVENOUS

## 2017-10-22 MED ORDER — DIPHENHYDRAMINE HCL 50 MG/ML IJ SOLN: 12.5000 mg | INTRAMUSCULAR | Status: DC | PRN

## 2017-10-22 MED ORDER — ONDANSETRON HCL 4 MG PO TABS: 4.0000 mg | ORAL_TABLET | ORAL | Status: DC | PRN

## 2017-10-22 MED ORDER — BUTORPHANOL TARTRATE 1 MG/ML IJ SOLN: 1.0000 mg | Freq: Once | INTRAMUSCULAR | Status: DC

## 2017-10-22 MED ORDER — OXYTOCIN 40 UNITS IN LACTATED RINGERS INFUSION - SIMPLE MED
INTRAVENOUS | Status: AC
  Filled 2017-10-22: qty 1000

## 2017-10-22 MED ORDER — ZOLPIDEM TARTRATE 5 MG PO TABS: 5.0000 mg | ORAL_TABLET | Freq: Every evening | ORAL | Status: DC | PRN

## 2017-10-22 MED ORDER — LIDOCAINE HCL (PF) 1 % IJ SOLN
INTRAMUSCULAR | Status: AC
  Filled 2017-10-22: qty 30

## 2017-10-22 MED ORDER — DIPHENHYDRAMINE HCL 25 MG PO CAPS: 25.0000 mg | ORAL_CAPSULE | Freq: Four times a day (QID) | ORAL | Status: DC | PRN

## 2017-10-22 MED ORDER — SENNOSIDES-DOCUSATE SODIUM 8.6-50 MG PO TABS
2.0000 | ORAL_TABLET | ORAL | Status: DC
  Administered 2017-10-23 – 2017-10-24 (×2): 2 via ORAL
  Filled 2017-10-22 (×2): qty 2

## 2017-10-22 MED ORDER — ONDANSETRON HCL 4 MG/2ML IJ SOLN: 4.0000 mg | INTRAMUSCULAR | Status: DC | PRN

## 2017-10-22 MED ORDER — LACTATED RINGERS IV SOLN: 500.0000 mL | INTRAVENOUS | Status: DC | PRN

## 2017-10-22 MED ORDER — PHENYLEPHRINE 40 MCG/ML (10ML) SYRINGE FOR IV PUSH (FOR BLOOD PRESSURE SUPPORT)
80.0000 ug | PREFILLED_SYRINGE | INTRAVENOUS | Status: DC | PRN
  Filled 2017-10-22: qty 5

## 2017-10-22 MED ORDER — IBUPROFEN 600 MG PO TABS
600.0000 mg | ORAL_TABLET | Freq: Four times a day (QID) | ORAL | Status: DC
  Administered 2017-10-22 – 2017-10-24 (×8): 600 mg via ORAL
  Filled 2017-10-22 (×8): qty 1

## 2017-10-22 MED ORDER — OXYTOCIN 40 UNITS IN LACTATED RINGERS INFUSION - SIMPLE MED: 2.5000 [IU]/h | INTRAVENOUS | Status: DC

## 2017-10-22 MED ORDER — WITCH HAZEL-GLYCERIN EX PADS: 1.0000 "application " | MEDICATED_PAD | CUTANEOUS | Status: DC | PRN

## 2017-10-22 MED ORDER — FLEET ENEMA 7-19 GM/118ML RE ENEM: 1.0000 | ENEMA | RECTAL | Status: DC | PRN

## 2017-10-22 MED ORDER — SOD CITRATE-CITRIC ACID 500-334 MG/5ML PO SOLN: 30.0000 mL | ORAL | Status: DC | PRN

## 2017-10-22 MED ORDER — BENZOCAINE-MENTHOL 20-0.5 % EX AERO
1.0000 "application " | INHALATION_SPRAY | CUTANEOUS | Status: DC | PRN
  Filled 2017-10-22: qty 56

## 2017-10-22 MED ORDER — LACTATED RINGERS IV SOLN
500.0000 mL | Freq: Once | INTRAVENOUS | Status: AC
  Administered 2017-10-22: 500 mL via INTRAVENOUS

## 2017-10-22 MED ORDER — TETANUS-DIPHTH-ACELL PERTUSSIS 5-2.5-18.5 LF-MCG/0.5 IM SUSP: 0.5000 mL | Freq: Once | INTRAMUSCULAR | Status: DC

## 2017-10-22 MED ORDER — FENTANYL 2.5 MCG/ML BUPIVACAINE 1/10 % EPIDURAL INFUSION (WH - ANES)
INTRAMUSCULAR | Status: AC
  Filled 2017-10-22: qty 100

## 2017-10-22 MED ORDER — SODIUM CHLORIDE 0.9 % IV SOLN
2.0000 g | Freq: Once | INTRAVENOUS | Status: AC
  Administered 2017-10-22: 2 g via INTRAVENOUS
  Filled 2017-10-22: qty 2000

## 2017-10-22 MED ORDER — ACETAMINOPHEN 325 MG PO TABS
650.0000 mg | ORAL_TABLET | ORAL | Status: DC | PRN
  Administered 2017-10-23 (×2): 650 mg via ORAL
  Filled 2017-10-22 (×2): qty 2

## 2017-10-22 MED ORDER — PHENYLEPHRINE 40 MCG/ML (10ML) SYRINGE FOR IV PUSH (FOR BLOOD PRESSURE SUPPORT)
PREFILLED_SYRINGE | INTRAVENOUS | Status: AC
  Filled 2017-10-22: qty 10

## 2017-10-22 MED ORDER — ONDANSETRON HCL 4 MG/2ML IJ SOLN: 4.0000 mg | Freq: Four times a day (QID) | INTRAMUSCULAR | Status: DC | PRN

## 2017-10-22 MED ORDER — SIMETHICONE 80 MG PO CHEW: 80.0000 mg | CHEWABLE_TABLET | ORAL | Status: DC | PRN

## 2017-10-22 MED ORDER — DIBUCAINE 1 % RE OINT: 1.0000 "application " | TOPICAL_OINTMENT | RECTAL | Status: DC | PRN

## 2017-10-22 MED ORDER — COCONUT OIL OIL
1.0000 "application " | TOPICAL_OIL | Status: DC | PRN
  Filled 2017-10-22: qty 120

## 2017-10-22 MED ORDER — LACTATED RINGERS IV SOLN
INTRAVENOUS | Status: DC
Start: 2017-10-22 — End: 2017-10-22
  Administered 2017-10-22: 14:00:00 via INTRAVENOUS

## 2017-10-22 NOTE — Progress Notes (Signed)
Patient ID: Betty Long, female   DOB: 1986-10-16, 31 y.o.   MRN: [id number] Pt received epidural and improved but still feeling pelvic pressure.  afeb vss Category 1 tracing  Cervix c/9/-1 OP  Ampicillin on board Pt feeling pressure, hopefully will rotate and be ready to push soon.

## 2017-10-22 NOTE — Lactation Note (Signed)
This note was copied from a baby's chart. Lactation Consultation Note  Patient Name: Betty Long Today's Date: 10/22/2017 Reason for consult: Initial assessment  Baby 67 hours old. Mom reports that she had a lot of trouble getting started nursing first child--using a NS for a while, which she does not want to use with this child. Mom states that this baby is nursing better, though he is having some trouble at the beginning of the feed getting latched. Mom able to latch baby in cross-cradle position to right breast and baby able to maintain a deep latch and suckle rhythmically with a few swallows noted. Mom given a manual pump to help evert left nipple prior to latching as needed. Mom given [facility] brochure, aware of OP/BFSG and [place] phone line assistance after D/C.   Maternal Data Formula Feeding for Exclusion: No Has patient been taught Hand Expression?: Yes Does the patient have breastfeeding experience prior to this delivery?: Yes  Feeding Feeding Type: Breast Fed  LATCH Score Latch: Repeated attempts needed to sustain latch, nipple held in mouth throughout feeding, stimulation needed to elicit sucking reflex.  Audible Swallowing: A few with stimulation  Type of Nipple: Everted at rest and after stimulation(short shaft)  Comfort (Breast/Nipple): Soft / non-tender  Hold (Positioning): No assistance needed to correctly position infant at breast.  LATCH Score: 8  Interventions Interventions: Skin to skin;Pre-pump if needed;Adjust position;Support pillows;Hand pump  Lactation Tools Discussed/Used Pump Review: Setup, frequency, and cleaning Initiated by:: JW Date initiated:: 10/22/17   Consult Status Consult Status: Follow-up Date: 10/23/17 Follow-up type: In-patient    Andres Labrum 10/22/2017, 10:15 PM

## 2017-10-22 NOTE — Anesthesia Preprocedure Evaluation (Signed)
Anesthesia Evaluation  Patient identified by MRN, date of birth, ID band Patient awake    Reviewed: Allergy & Precautions, H&P , NPO status , Patient's Chart, lab work & pertinent test results  Airway Mallampati: I  TM Distance: >3 FB Neck ROM: full    Dental no notable dental hx. (+) Teeth Intact   Pulmonary neg pulmonary ROS,    Pulmonary exam normal breath sounds clear to auscultation       Cardiovascular negative cardio ROS Normal cardiovascular exam Rhythm:regular Rate:Normal     Neuro/Psych negative neurological ROS  negative psych ROS   GI/Hepatic negative GI ROS, Neg liver ROS,   Endo/Other  negative endocrine ROS  Renal/GU negative Renal ROS     Musculoskeletal   Abdominal Normal abdominal exam  (+)   Peds  Hematology negative hematology ROS (+)   Anesthesia Other Findings   Reproductive/Obstetrics (+) Pregnancy                            Anesthesia Physical Anesthesia Plan  ASA: II  Anesthesia Plan: Epidural   Post-op Pain Management:    Induction:   PONV Risk Score and Plan:   Airway Management Planned:   Additional Equipment:   Intra-op Plan:   Post-operative Plan:   Informed Consent: I have reviewed the patients History and Physical, chart, labs and discussed the procedure including the risks, benefits and alternatives for the proposed anesthesia with the patient or authorized representative who has indicated his/her understanding and acceptance.     Plan Discussed with:   Anesthesia Plan Comments:         Anesthesia Quick Evaluation  

## 2017-10-22 NOTE — Anesthesia Procedure Notes (Signed)
Epidural Patient location during procedure: OB Start time: 10/22/2017 2:55 PM End time: 10/22/2017 2:57 PM  Staffing Anesthesiologist: Lyn Hollingshead, MD Performed: anesthesiologist   Preanesthetic Checklist Completed: patient identified, site marked, surgical consent, pre-op evaluation, timeout performed, IV checked, risks and benefits discussed and monitors and equipment checked  Epidural Patient position: sitting Prep: site prepped and draped and DuraPrep Patient monitoring: continuous pulse ox and blood pressure Approach: midline Location: L3-L4 Injection technique: LOR air  Needle:  Needle type: Tuohy  Needle gauge: 17 G Needle length: 9 cm and 9 Needle insertion depth: 5 cm cm Catheter type: closed end flexible Catheter size: 19 Gauge Catheter at skin depth: 10 cm Test dose: negative and Other  Assessment Sensory level: T9 Events: blood not aspirated, injection not painful, no injection resistance, negative IV test and no paresthesia

## 2017-10-22 NOTE — H&P (Signed)
Betty Long is a 31 y.o. female G2P1001 at 58 3/7 weeks (EDD 10/26/17 by LMP c/w 9 week [place]) presenting in active labor.  Prenatal care significant only for GBS positive status.  She had a breast mass evaluated in pregnancy which was just a benign complex cyst.    OB History    Gravida Para Term Preterm AB Living   2 1 1  0 0 1   SAB TAB Ectopic Multiple Live Births   0 0 0 0 1    2016 NSVD  8lb 3 oz  Past Medical History:  Diagnosis Date  . History of abnormal cervical Pap smear    ASCUS  ---  NEGATIVE HPV  . Mass of left knee   . Vaginal Pap smear, abnormal    Past Surgical History:  Procedure Laterality Date  . ANTERIOR CRUCIATE LIGAMENT REPAIR Left 2010  . COSMETIC SURGERY     laceration repair  . KNEE ARTHROSCOPY Left 02/09/2014   Procedure: LEFT KNEE EXCISIONAL BIOPSY;  Surgeon: Sydnee Cabal, MD;  Location: [facility];  Service: Orthopedics;  Laterality: Left;  . KNEE ARTHROSCOPY W/ LATERAL RELEASE Left 09-16-2011   PATELLA MALTRACKING  . MYRINGOTOMY     61mos 6 mos  . TONSILLECTOMY  1993   Family History: family history includes Anemia in her mother; Breast cancer in her maternal grandmother; Cancer in her maternal grandfather and maternal grandmother; Hypertension in her brother and father. Social History:  reports that  has never smoked. she has never used smokeless tobacco. She reports that she does not drink alcohol or use drugs.     Maternal Diabetes: No Genetic Screening: Normal Maternal Ultrasounds/Referrals: Normal Fetal Ultrasounds or other Referrals:  None Maternal Substance Abuse:  No Significant Maternal Medications:  None Significant Maternal Lab Results:  Lab values include: Group B Strep positive Other Comments:  None  Review of Systems  Gastrointestinal: Positive for abdominal pain.  Neurological: Negative for headaches.   Maternal Medical History:  Reason for admission: Contractions.   Contractions: Onset was 3-5  hours ago.   Frequency: regular.   Perceived severity is strong.    Fetal activity: Perceived fetal activity is normal.    Prenatal complications: no prenatal complications Prenatal Complications - Diabetes: none.    Cervix 8/c/-1 Intact  Blood pressure 125/78, pulse (!) 102, temperature 97.9 F (36.6 C), temperature source Oral, resp. rate 18, last menstrual period 01/19/2017, SpO2 96 %, unknown if currently breastfeeding. Maternal Exam:  Uterine Assessment: Contraction strength is firm.  Contraction frequency is regular.   Abdomen: Patient reports no abdominal tenderness. Fetal presentation: vertex  Introitus: Normal vulva. Normal vagina.    Physical Exam  Constitutional: She appears well-developed and well-nourished.  Cardiovascular: Normal rate and regular rhythm.  Respiratory: Effort normal.  GI: Soft.  Genitourinary: Vagina normal.  Musculoskeletal: Normal range of motion.  Neurological: She is alert.  Psychiatric: She has a normal mood and affect.    Prenatal labs: ABO, Rh: A/Positive/-- (04/24 0000) Antibody: Negative (04/24 0000) Rubella: Immune (04/24 0000) RPR: Nonreactive (04/24 0000)  HBsAg: Negative (04/24 0000)  HIV: Non-reactive (04/24 0000)  GBS: Positive (10/29 0000)  First trimester screen and AFP negative One hour GCT 87  Assessment/Plan: Pt admitted in active labor.  Will get epidural and Ampicillin for +GBS   Logan Bores 10/22/2017, 2:38 PM

## 2017-10-22 NOTE — Anesthesia Pain Management Evaluation Note (Signed)
  CRNA Pain Management Visit Note  Patient: Betty Long, 31 y.o., female  "Hello I am a member of the anesthesia team at [facility]. We have an anesthesia team available at all times to provide care throughout the hospital, including epidural management and anesthesia for C-section. I don't know your plan for the delivery whether it a natural birth, water birth, IV sedation, nitrous supplementation, doula or epidural, but we want to meet your pain goals."   1.Was your pain managed to your expectations on prior hospitalizations?   Yes   2.What is your expectation for pain management during this hospitalization?     Epidural  3.How can we help you reach that goal? Epidural in place and working well after one bolus from te epidural pump,  Record the patient's initial score and the patient's pain goal.   Pain: 3  Pain Goal: 5 [facility] wants you to be able to say your pain was always managed very well.  Betty Long 10/22/2017

## 2017-10-23 ENCOUNTER — Other Ambulatory Visit: Payer: Self-pay

## 2017-10-23 LAB — CBC
HEMATOCRIT: 33.2 % — AB (ref 36.0–46.0)
HEMOGLOBIN: 10.7 g/dL — AB (ref 12.0–15.0)
MCH: 28 pg (ref 26.0–34.0)
MCHC: 32.2 g/dL (ref 30.0–36.0)
MCV: 86.9 fL (ref 78.0–100.0)
Platelets: 232 10*3/uL (ref 150–400)
RBC: 3.82 MIL/uL — AB (ref 3.87–5.11)
RDW: 16.2 % — ABNORMAL HIGH (ref 11.5–15.5)
WBC: 11.3 10*3/uL — AB (ref 4.0–10.5)

## 2017-10-23 LAB — RPR: RPR: NONREACTIVE

## 2017-10-23 MED ORDER — PANTOPRAZOLE SODIUM 40 MG PO TBEC
40.0000 mg | DELAYED_RELEASE_TABLET | Freq: Every day | ORAL | Status: DC
  Administered 2017-10-23 – 2017-10-24 (×2): 40 mg via ORAL
  Filled 2017-10-23 (×2): qty 1

## 2017-10-23 NOTE — Progress Notes (Signed)
When RN entered room for morning medication pass, pt reported she had two small gushes of blood in the past hour. RN assisted pt to bathroom to find pad less than 50% saturated. RN checked fundus when returned to bed, WNL.

## 2017-10-23 NOTE — Progress Notes (Signed)
Post Partum Day 1 Subjective: no complaints and up ad lib  Objective: Blood pressure 112/62, pulse 84, temperature 98.1 F (36.7 C), temperature source Oral, resp. rate 18, last menstrual period 01/19/2017, SpO2 99 %, unknown if currently breastfeeding.  Physical Exam:  General: alert and cooperative Lochia: appropriate Uterine Fundus: firm   Recent Labs    10/22/17 1419 10/23/17 0545  HGB 13.0 10.7*  HCT 41.0 33.2*    Assessment/Plan: Plan for discharge tomorrow  Circumcision done and circ care reviewed with patient   LOS: 1 day   Betty Long 10/23/2017, 9:55 AM

## 2017-10-23 NOTE — Lactation Note (Signed)
This note was copied from a baby's chart. Lactation Consultation Note:  Mother reports that breastfeeding is going well. She reports that only a few times infant has pulled back and pinched her nipple . Mother encouraged to rotate positions frequently. Discussed cluster feeding. Mother receptive to all teaching. Advised mother to continue to cue base feed and feed at least 8-12 times in 24 hours.   Patient Name: Betty Long [id number] Date: 10/23/2017 Reason for consult: Follow-up assessment   Maternal Data    Feeding    LATCH Score                   Interventions    Lactation Tools Discussed/Used     Consult Status Consult Status: Follow-up Date: 10/24/17 Follow-up type: In-patient    Jess Barters [facility] 10/23/2017, 2:43 PM

## 2017-10-23 NOTE — Anesthesia Postprocedure Evaluation (Signed)
Anesthesia Post Note  Patient: Betty Long [id number]  Procedure(s) Performed: AN AD [place]     Patient location during evaluation: Mother Baby Anesthesia Type: Epidural Level of consciousness: awake Pain management: satisfactory to patient Vital Signs Assessment: post-procedure vital signs reviewed and stable Respiratory status: spontaneous breathing Cardiovascular status: stable Anesthetic complications: no    Last Vitals:  Vitals:   10/23/17 0619 10/23/17 0845  BP: 112/62 (!) 114/58  Pulse: 84 88  Resp: 18 16  Temp: 36.7 C 36.6 C  SpO2:      Last Pain:  Vitals:   10/23/17 0845  TempSrc: Oral  PainSc:    Pain Goal:                 [employer]

## 2017-10-24 MED ORDER — IBUPROFEN 600 MG PO TABS: 600.0000 mg | ORAL_TABLET | Freq: Four times a day (QID) | ORAL | 1 refills | Status: DC | PRN

## 2017-10-24 NOTE — Lactation Note (Signed)
This note was copied from a baby's chart. 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 Mother was given pump.   Mother reports that her nipples are slightly sore. Mother request assistance with latching infant on in football hold to rotate positions.  Infant sleeping in crib now. Mother will page [facility] for assistance with next feeding within 30 mins.   Discussed treatment and prevention of engorgement . Mother advised to continue to cue base feed and feed infant at least 8-12 times in 24 hours.  Reviewed S/S of Mastitis. Mother receptive to all teaching. Mother is aware of available [place] services, BFSG'S, Out Pt Dept and phone line for breastfeeding questions or concerns.   Patient Name: Betty Long [id number] Date: 10/24/2017 Reason for consult: Follow-up assessment   Maternal Data    Feeding Feeding Type: Breast Fed Length of feed: 45 min  LATCH Score                   Interventions    Lactation Tools Discussed/Used     Consult Status Consult Status: Follow-up    Jess Barters [facility] 10/24/2017, 10:20 AM

## 2017-10-24 NOTE — Discharge Summary (Signed)
OB Discharge Summary     Patient Name: Betty Long [id number] DOB: 1986/06/20 MRN: [id number]  Date of admission: 10/22/2017 Delivering MD: Paula Compton   Date of discharge: 10/24/2017  Admitting diagnosis: LABOR Intrauterine pregnancy: [redacted]w[redacted]d     Secondary diagnosis:  Active Problems:   NSVD (normal spontaneous vaginal delivery)   Indication for care in labor or delivery  Additional problems: none     Discharge diagnosis: Term Pregnancy Delivered                                                                                                Post partum procedures:none  Augmentation: none  Complications: None  Hospital course:  Onset of Labor With Vaginal Delivery     31 y.o. yo [id number] at [redacted]w[redacted]d was admitted in Active Labor on 10/22/2017. Patient had an uncomplicated labor course as follows:  Membrane Rupture Time/Date: 3:48 PM ,10/22/2017   Intrapartum Procedures: Episiotomy: None [1]                                         Lacerations:  2nd degree [3];Perineal [11]  Patient had a delivery of a Viable infant. 10/22/2017  Information for the patient's newborn:  Copeland, Lapier Boy Cynia [[id number]]  Delivery Method: Vag-Spont    Pateint had an uncomplicated postpartum course.  She is ambulating, tolerating a regular diet, passing flatus, and urinating well. Patient is discharged home in stable condition on 10/24/17.   Physical exam  Vitals:   10/23/17 0619 10/23/17 0845 10/23/17 2000 10/24/17 0618  BP: 112/62 (!) 114/58 118/74 108/62  Pulse: 84 88 93 69  Resp: 18 16 18 18   Temp: 98.1 F (36.7 C) 97.8 F (36.6 C) 98 F (36.7 C) 97.8 F (36.6 C)  TempSrc: Oral Oral Oral Oral  SpO2:      Weight:   179 lb (81.2 kg)   Height:   5\' 5"  (1.651 m)    General: alert, cooperative and no distress Lochia: appropriate Uterine Fundus: firm Incision: N/A DVT Evaluation: No evidence of DVT seen on physical exam. Labs: Lab Results  Component Value Date   WBC 11.3 (H)  10/23/2017   HGB 10.7 (L) 10/23/2017   HCT 33.2 (L) 10/23/2017   MCV 86.9 10/23/2017   PLT 232 10/23/2017   CMP Latest Ref Rng & Units 08/07/2011  Glucose 70 - 99 mg/dL 91  BUN 6 - 23 mg/dL 12  Creatinine 0.50 - 1.10 mg/dL 0.90  Sodium 135 - 145 mEq/L 138  Potassium 3.5 - 5.1 mEq/L 4.1  Chloride 96 - 112 mEq/L 102    Discharge instruction: per After Visit Summary and "Baby and Me Booklet".  After visit meds:  Allergies as of 10/24/2017      Reactions   Percocet [oxycodone-acetaminophen] Itching   Vicodin [hydrocodone-acetaminophen] Itching      Medication List    TAKE these medications   calcium carbonate 500 MG chewable tablet Commonly known as:  TUMS - dosed in mg elemental calcium Chew  1 tablet by mouth 2 (two) times daily as needed for indigestion or heartburn.   ibuprofen 600 MG tablet Commonly known as:  ADVIL,MOTRIN Take 1 tablet (600 mg total) by mouth every 6 (six) hours as needed for moderate pain or cramping.   omega-3 acid ethyl esters 1 g capsule Commonly known as:  LOVAZA Take 2 g by mouth daily.   pantoprazole 40 MG tablet Commonly known as:  PROTONIX Take 40 mg by mouth daily.   prenatal multivitamin Tabs tablet Take 1 tablet by mouth daily at 12 noon.       Diet: routine diet  Activity: Advance as tolerated. Pelvic rest for 6 weeks.   Outpatient follow YB:[facility] partum phone call in three weeks and postpartum visit in 6weeks Follow up Appt:No future appointments. Follow up Visit:No Follow-up on file.  Postpartum contraception: Not Discussed  Newborn Data: Live born female  Birth Weight: 8 lb 1.5 oz (3670 g) APGAR: 36, 10  Newborn Delivery   Birth date/time:  10/22/2017 16:37:00 Delivery type:  Vaginal, Spontaneous     Baby Feeding: Breast Disposition:home with mother   10/24/2017 Isaiah Serge, DO

## 2017-10-24 NOTE — Progress Notes (Signed)
Patient ID: Betty Long, female   DOB: [birth year], 31 y.o.   MRN: [id number] Pt doing well. Pain well controlled. Ambulating and voiding well. Denies fever, chills, SOB or CP. Lochia mild. Breastfeeding and bonding well with bay. Feels ready for discharge to home today VSS ABD - soft, firm EXT - no homans, no edema  A/P: PPD#2 s/p svd - stable         Discharge instructions reviewed         D/C home

## 2017-10-24 NOTE — Discharge Instructions (Signed)
Nothing in vagina for 6 weeks.  No sex, tampons, and douching.  Other instructions as in [facility] Discharge Booklet. °

## 2017-10-26 MED FILL — IBUPROFEN 600 MG TABLET: 600 | 10 days supply | Qty: 40 | Fill #0

## 2017-10-27 ENCOUNTER — Inpatient Hospital Stay (HOSPITAL_COMMUNITY): Payer: 59

## 2017-10-27 MED FILL — AZITHROMYCIN 250 MG TAB: 250 | 5 days supply | Qty: 6 | Fill #0

## 2017-12-07 DIAGNOSIS — Z3009 Encounter for other general counseling and advice on contraception: Secondary | ICD-10-CM | POA: Diagnosis not present

## 2017-12-07 DIAGNOSIS — Z124 Encounter for screening for malignant neoplasm of cervix: Secondary | ICD-10-CM | POA: Diagnosis not present

## 2017-12-07 DIAGNOSIS — Z1151 Encounter for screening for human papillomavirus (HPV): Secondary | ICD-10-CM | POA: Diagnosis not present

## 2017-12-07 DIAGNOSIS — Z1389 Encounter for screening for other disorder: Secondary | ICD-10-CM | POA: Diagnosis not present

## 2017-12-07 LAB — HM PAP SMEAR: HM Pap smear: NORMAL

## 2018-05-24 ENCOUNTER — Encounter: Payer: Self-pay | Admitting: Nurse Practitioner

## 2018-05-24 ENCOUNTER — Ambulatory Visit: Payer: Self-pay | Admitting: Nurse Practitioner

## 2018-05-24 VITALS — BP 100/60 | HR 92 | Temp 98.3°F | Ht 68.0 in | Wt 130.4 lb

## 2018-05-24 DIAGNOSIS — Z0000 Encounter for general adult medical examination without abnormal findings: Secondary | ICD-10-CM

## 2018-05-24 NOTE — Patient Instructions (Signed)
Health Maintenance, Female Adopting a healthy lifestyle and getting preventive care can go a long way to promote health and wellness. Talk with your health care provider about what schedule of regular examinations is right for you. This is a good chance for you to check in with your provider about disease prevention and staying healthy. In between checkups, there are plenty of things you can do on your own. Experts have done a lot of research about which lifestyle changes and preventive measures are most likely to keep you healthy. Ask your health care provider for more information. Weight and diet Eat a healthy diet  Be sure to include plenty of vegetables, fruits, low-fat dairy products, and lean protein.  Do not eat a lot of foods high in solid fats, added sugars, or salt.  Get regular exercise. This is one of the most important things you can do for your health. ? Most adults should exercise for at least 150 minutes each week. The exercise should increase your heart rate and make you sweat (moderate-intensity exercise). ? Most adults should also do strengthening exercises at least twice a week. This is in addition to the moderate-intensity exercise.  Maintain a healthy weight  Body mass index (BMI) is a measurement that can be used to identify possible weight problems. It estimates body fat based on height and weight. Your health care provider can help determine your BMI and help you achieve or maintain a healthy weight.  For females 20 years of age and older: ? A BMI below 18.5 is considered underweight. ? A BMI of 18.5 to 24.9 is normal. ? A BMI of 25 to 29.9 is considered overweight. ? A BMI of 30 and above is considered obese.  Watch levels of cholesterol and blood lipids  You should start having your blood tested for lipids and cholesterol at 32 years of age, then have this test every 5 years.  You may need to have your cholesterol levels checked more often if: ? Your lipid or  cholesterol levels are high. ? You are older than 32 years of age. ? You are at high risk for heart disease.  Cancer screening Lung Cancer  Lung cancer screening is recommended for adults 55-80 years old who are at high risk for lung cancer because of a history of smoking.  A yearly low-dose CT scan of the lungs is recommended for people who: ? Currently smoke. ? Have quit within the past 15 years. ? Have at least a 30-pack-year history of smoking. A pack year is smoking an average of one pack of cigarettes a day for 1 year.  Yearly screening should continue until it has been 15 years since you quit.  Yearly screening should stop if you develop a health problem that would prevent you from having lung cancer treatment.  Breast Cancer  Practice breast self-awareness. This means understanding how your breasts normally appear and feel.  It also means doing regular breast self-exams. Let your health care provider know about any changes, no matter how small.  If you are in your 20s or 30s, you should have a clinical breast exam (CBE) by a health care provider every 1-3 years as part of a regular health exam.  If you are 40 or older, have a CBE every year. Also consider having a breast X-ray (mammogram) every year.  If you have a family history of breast cancer, talk to your health care provider about genetic screening.  If you are at high risk   for breast cancer, talk to your health care provider about having an MRI and a mammogram every year.  Breast cancer gene (BRCA) assessment is recommended for women who have family members with BRCA-related cancers. BRCA-related cancers include: ? Breast. ? Ovarian. ? Tubal. ? Peritoneal cancers.  Results of the assessment will determine the need for genetic counseling and BRCA1 and BRCA2 testing.  Cervical Cancer Your health care provider may recommend that you be screened regularly for cancer of the pelvic organs (ovaries, uterus, and  vagina). This screening involves a pelvic examination, including checking for microscopic changes to the surface of your cervix (Pap test). You may be encouraged to have this screening done every 3 years, beginning at age 22.  For women ages 56-65, health care providers may recommend pelvic exams and Pap testing every 3 years, or they may recommend the Pap and pelvic exam, combined with testing for human papilloma virus (HPV), every 5 years. Some types of HPV increase your risk of cervical cancer. Testing for HPV may also be done on women of any age with unclear Pap test results.  Other health care providers may not recommend any screening for nonpregnant women who are considered low risk for pelvic cancer and who do not have symptoms. Ask your health care provider if a screening pelvic exam is right for you.  If you have had past treatment for cervical cancer or a condition that could lead to cancer, you need Pap tests and screening for cancer for at least 20 years after your treatment. If Pap tests have been discontinued, your risk factors (such as having a new sexual partner) need to be reassessed to determine if screening should resume. Some women have medical problems that increase the chance of getting cervical cancer. In these cases, your health care provider may recommend more frequent screening and Pap tests.  Colorectal Cancer  This type of cancer can be detected and often prevented.  Routine colorectal cancer screening usually begins at 32 years of age and continues through 32 years of age.  Your health care provider may recommend screening at an earlier age if you have risk factors for colon cancer.  Your health care provider may also recommend using home test kits to check for hidden blood in the stool.  A small camera at the end of a tube can be used to examine your colon directly (sigmoidoscopy or colonoscopy). This is done to check for the earliest forms of colorectal  cancer.  Routine screening usually begins at age 33.  Direct examination of the colon should be repeated every 5-10 years through 32 years of age. However, you may need to be screened more often if early forms of precancerous polyps or small growths are found.  Skin Cancer  Check your skin from head to toe regularly.  Tell your health care provider about any new moles or changes in moles, especially if there is a change in a mole's shape or color.  Also tell your health care provider if you have a mole that is larger than the size of a pencil eraser.  Always use sunscreen. Apply sunscreen liberally and repeatedly throughout the day.  Protect yourself by wearing long sleeves, pants, a wide-brimmed hat, and sunglasses whenever you are outside.  Heart disease, diabetes, and high blood pressure  High blood pressure causes heart disease and increases the risk of stroke. High blood pressure is more likely to develop in: ? People who have blood pressure in the high end of  the normal range (130-139/85-89 mm Hg). ? People who are overweight or obese. ? People who are African American.  If you are 21-29 years of age, have your blood pressure checked every 3-5 years. If you are 3 years of age or older, have your blood pressure checked every year. You should have your blood pressure measured twice-once when you are at a hospital or clinic, and once when you are not at a hospital or clinic. Record the average of the two measurements. To check your blood pressure when you are not at a hospital or clinic, you can use: ? An automated blood pressure machine at a pharmacy. ? A home blood pressure monitor.  If you are between 17 years and 37 years old, ask your health care provider if you should take aspirin to prevent strokes.  Have regular diabetes screenings. This involves taking a blood sample to check your fasting blood sugar level. ? If you are at a normal weight and have a low risk for diabetes,  have this test once every three years after 32 years of age. ? If you are overweight and have a high risk for diabetes, consider being tested at a younger age or more often. Preventing infection Hepatitis B  If you have a higher risk for hepatitis B, you should be screened for this virus. You are considered at high risk for hepatitis B if: ? You were born in a country where hepatitis B is common. Ask your health care provider which countries are considered high risk. ? Your parents were born in a high-risk country, and you have not been immunized against hepatitis B (hepatitis B vaccine). ? You have HIV or AIDS. ? You use needles to inject street drugs. ? You live with someone who has hepatitis B. ? You have had sex with someone who has hepatitis B. ? You get hemodialysis treatment. ? You take certain medicines for conditions, including cancer, organ transplantation, and autoimmune conditions.  Hepatitis C  Blood testing is recommended for: ? Everyone born from [birth year] through 1965. ? Anyone with known risk factors for hepatitis C.  Sexually transmitted infections (STIs)  You should be screened for sexually transmitted infections (STIs) including gonorrhea and chlamydia if: ? You are sexually active and are younger than 32 years of age. ? You are older than 32 years of age and your health care provider tells you that you are at risk for this type of infection. ? Your sexual activity has changed since you were last screened and you are at an increased risk for chlamydia or gonorrhea. Ask your health care provider if you are at risk.  If you do not have HIV, but are at risk, it may be recommended that you take a prescription medicine daily to prevent HIV infection. This is called pre-exposure prophylaxis (PrEP). You are considered at risk if: ? You are sexually active and do not regularly use condoms or know the HIV status of your partner(s). ? You take drugs by injection. ? You are  sexually active with a partner who has HIV.  Talk with your health care provider about whether you are at high risk of being infected with HIV. If you choose to begin PrEP, you should first be tested for HIV. You should then be tested every 3 months for as long as you are taking PrEP. Pregnancy  If you are premenopausal and you may become pregnant, ask your health care provider about preconception counseling.  If you may become  pregnant, take 400 to 800 micrograms (mcg) of folic acid every day.  If you want to prevent pregnancy, talk to your health care provider about birth control (contraception). Osteoporosis and menopause  Osteoporosis is a disease in which the bones lose minerals and strength with aging. This can result in serious bone fractures. Your risk for osteoporosis can be identified using a bone density scan.  If you are 65 years of age or older, or if you are at risk for osteoporosis and fractures, ask your health care provider if you should be screened.  Ask your health care provider whether you should take a calcium or vitamin D supplement to lower your risk for osteoporosis.  Menopause may have certain physical symptoms and risks.  Hormone replacement therapy may reduce some of these symptoms and risks. Talk to your health care provider about whether hormone replacement therapy is right for you. Follow these instructions at home:  Schedule regular health, dental, and eye exams.  Stay current with your immunizations.  Do not use any tobacco products including cigarettes, chewing tobacco, or electronic cigarettes.  If you are pregnant, do not drink alcohol.  If you are breastfeeding, limit how much and how often you drink alcohol.  Limit alcohol intake to no more than 1 drink per day for nonpregnant women. One drink equals 12 ounces of beer, 5 ounces of wine, or 1 ounces of hard liquor.  Do not use street drugs.  Do not share needles.  Ask your health care  provider for help if you need support or information about quitting drugs.  Tell your health care provider if you often feel depressed.  Tell your health care provider if you have ever been abused or do not feel safe at home. This information is not intended to replace advice given to you by your health care provider. Make sure you discuss any questions you have with your health care provider. Document Released: 06/02/2011 Document Revised: 04/24/2016 Document Reviewed: 08/21/2015 Elsevier Interactive Patient Education  2018 [employer]  Preventive Care 18-39 Years, Female Preventive care refers to lifestyle choices and visits with your health care provider that can promote health and wellness. What does preventive care include?  A yearly physical exam. This is also called an annual well check.  Dental exams once or twice a year.  Routine eye exams. Ask your health care provider how often you should have your eyes checked.  Personal lifestyle choices, including: ? Daily care of your teeth and gums. ? Regular physical activity. ? Eating a healthy diet. ? Avoiding tobacco and drug use. ? Limiting alcohol use. ? Practicing safe sex. ? Taking vitamin and mineral supplements as recommended by your health care provider. What happens during an annual well check? The services and screenings done by your health care provider during your annual well check will depend on your age, overall health, lifestyle risk factors, and family history of disease. Counseling Your health care provider may ask you questions about your:  Alcohol use.  Tobacco use.  Drug use.  Emotional well-being.  Home and relationship well-being.  Sexual activity.  Eating habits.  Work and work environment.  Method of birth control.  Menstrual cycle.  Pregnancy history.  Screening You may have the following tests or measurements:  Height, weight, and BMI.  Diabetes screening. This is done by  checking your blood sugar (glucose) after you have not eaten for a while (fasting).  Blood pressure.  Lipid and cholesterol levels. These may be checked   every 5 years starting at age 70.  Skin check.  Hepatitis C blood test.  Hepatitis B blood test.  Sexually transmitted disease (STD) testing.  BRCA-related cancer screening. This may be done if you have a family history of breast, ovarian, tubal, or peritoneal cancers.  Pelvic exam and Pap test. This may be done every 3 years starting at age 6. Starting at age 57, this may be done every 5 years if you have a Pap test in combination with an HPV test.  Discuss your test results, treatment options, and if necessary, the need for more tests with your health care provider. Vaccines Your health care provider may recommend certain vaccines, such as:  Influenza vaccine. This is recommended every year.  Tetanus, diphtheria, and acellular pertussis (Tdap, Td) vaccine. You may need a Td booster every 10 years.  Varicella vaccine. You may need this if you have not been vaccinated.  HPV vaccine. If you are 30 or younger, you may need three doses over 6 months.  Measles, mumps, and rubella (MMR) vaccine. You may need at least one dose of MMR. You may also need a second dose.  Pneumococcal 13-valent conjugate (PCV13) vaccine. You may need this if you have certain conditions and were not previously vaccinated.  Pneumococcal polysaccharide (PPSV23) vaccine. You may need one or two doses if you smoke cigarettes or if you have certain conditions.  Meningococcal vaccine. One dose is recommended if you are age 50-21 years and a first-year college student living in a residence hall, or if you have one of several medical conditions. You may also need additional booster doses.  Hepatitis A vaccine. You may need this if you have certain conditions or if you travel or work in places where you may be exposed to hepatitis A.  Hepatitis B vaccine. You  may need this if you have certain conditions or if you travel or work in places where you may be exposed to hepatitis B.  Haemophilus influenzae type b (Hib) vaccine. You may need this if you have certain risk factors.  Talk to your health care provider about which screenings and vaccines you need and how often you need them. This information is not intended to replace advice given to you by your health care provider. Make sure you discuss any questions you have with your health care provider. Document Released: 01/13/2002 Document Revised: 08/06/2016 Document Reviewed: 09/18/2015 Elsevier Interactive Patient Education  2018 [employer].  Preventing Hypertension Hypertension, commonly called high blood pressure, is when the force of blood pumping through the arteries is too strong. Arteries are blood vessels that carry blood from the heart throughout the body. Over time, hypertension can damage the arteries and decrease blood flow to important parts of the body, including the brain, heart, and kidneys. Often, hypertension does not cause symptoms until blood pressure is very high. For this reason, it is important to have your blood pressure checked on a regular basis. Hypertension can often be prevented with diet and lifestyle changes. If you already have hypertension, you can control it with diet and lifestyle changes, as well as medicine. What nutrition changes can be made? Maintain a healthy diet. This includes:  Eating less salt (sodium). Ask your health care provider how much sodium is safe for you to have. The general recommendation is to consume less than 1 tsp (2,300 mg) of sodium a day. ? Do not add salt to your food. ? Choose low-sodium options when grocery shopping and eating out.  Limiting fats in your diet. You can do this by eating low-fat or fat-free dairy products and by eating less red meat.  Eating more fruits, vegetables, and whole grains. Make a goal to eat: ? 1-2 cups of  fresh fruits and vegetables each day. ? 3-4 servings of whole grains each day.  Avoiding foods and beverages that have added sugars.  Eating fish that contain healthy fats (omega-3 fatty acids), such as mackerel or salmon.  If you need help putting together a healthy eating plan, try the DASH diet. This diet is high in fruits, vegetables, and whole grains. It is low in sodium, red meat, and added sugars. DASH stands for Dietary Approaches to Stop Hypertension. What lifestyle changes can be made?  Lose weight if you are overweight. Losing just 3?5% of your body weight can help prevent or control hypertension. ? For example, if your present weight is 200 lb (91 kg), a loss of 3-5% of your weight means losing 6-10 lb (2.7-4.5 kg). ? Ask your health care provider to help you with a diet and exercise plan to safely lose weight.  Get enough exercise. Do at least 150 minutes of moderate-intensity exercise each week. ? You could do this in short exercise sessions several times a day, or you could do longer exercise sessions a few times a week. For example, you could take a brisk 10-minute walk or bike ride, 3 times a day, for 5 days a week.  Find ways to reduce stress, such as exercising, meditating, listening to music, or taking a yoga class. If you need help reducing stress, ask your health care provider.  Do not smoke. This includes e-cigarettes. Chemicals in tobacco and nicotine products raise your blood pressure each time you smoke. If you need help quitting, ask your health care provider.  Avoid alcohol. If you drink alcohol, limit alcohol intake to no more than 1 drink a day for nonpregnant women and 2 drinks a day for men. One drink equals 12 oz of beer, 5 oz of wine, or 1 oz of hard liquor. Why are these changes important? Diet and lifestyle changes can help you prevent hypertension, and they may make you feel better overall and improve your quality of life. If you have hypertension, making  these changes will help you control it and help prevent major complications, such as:  Hardening and narrowing of arteries that supply blood to: ? Your heart. This can cause a heart attack. ? Your brain. This can cause a stroke. ? Your kidneys. This can cause kidney failure.  Stress on your heart muscle, which can cause heart failure.  What can I do to lower my risk?  Work with your health care provider to make a hypertension prevention plan that works for you. Follow your plan and keep all follow-up visits as told by your health care provider.  Learn how to check your blood pressure at home. Make sure that you know your personal target blood pressure, as told by your health care provider. How is this treated? In addition to diet and lifestyle changes, your health care provider may recommend medicines to help lower your blood pressure. You may need to try a few different medicines to find what works best for you. You also may need to take more than one medicine. Take over-the-counter and prescription medicines only as told by your health care provider. Where to find support: Your health care provider can help you prevent hypertension and help you keep your blood  pressure at a healthy level. Your local hospital or your community may also provide support services and prevention programs. The American Heart Association offers an online support network at: CheapBootlegs.com.cy Where to find more information: Learn more about hypertension from:  National Heart, Lung, and Blood Institute: ElectronicHangman.is  Centers for Disease Control and Prevention: https://ingram.com/  American Academy of Family Physicians: http://familydoctor.org/familydoctor/en/diseases-conditions/high-blood-pressure.printerview.all.html  Learn more about the DASH diet from:  [place], Lung, and [place]:  https://www.reyes.com/  Contact a health care provider if:  You think you are having a reaction to medicines you have taken.  You have recurrent headaches or feel dizzy.  You have swelling in your ankles.  You have trouble with your vision. Summary  Hypertension often does not cause any symptoms until blood pressure is very high. It is important to get your blood pressure checked regularly.  Diet and lifestyle changes are the most important steps in preventing hypertension.  By keeping your blood pressure in a healthy range, you can prevent complications like heart attack, heart failure, stroke, and kidney failure.  Work with your health care provider to make a hypertension prevention plan that works for you. This information is not intended to replace advice given to you by your health care provider. Make sure you discuss any questions you have with your health care provider. Document Released: 12/02/2015 Document Revised: 07/28/2016 Document Reviewed: 07/28/2016 Elsevier Interactive Patient Education  [employer].

## 2018-05-24 NOTE — Progress Notes (Addendum)
Subjective:  Betty Long is a 32 y.o. female who presents for basic physical exam. 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  Patient denies any current health related concerns today.  The patient denies any past medical history of heart disease, lung disease, kidney disease, liver disease, DM, asthma, seizures or HTN.  The patient informs that she does not take any medications and is allergic to percocet and vicodin.  The patient's LMP was 2 weeks ago, but states it is irregular because she is breastfeeding.  The patient's immunizations are up to date.  .   The patient has a surgical history of 3 surgeries to her left knee.  The patient is married and has 2 children.  The patient states her children are healthy.  The patient has a family history of HTN on her father's side and states her mother is healthy.  The patient has one brother who has HTN.  The patient denies the use of recreational drugs, does not smoke and does not drink.   Immunization History  Administered Date(s) Administered  . MMR 04/05/2014    Past Medical History:  Diagnosis Date  . History of abnormal cervical Pap smear    ASCUS  ---  NEGATIVE HPV  . Mass of left knee   . Vaginal Pap smear, abnormal     Past Surgical History:  Procedure Laterality Date  . ANTERIOR CRUCIATE LIGAMENT REPAIR Left 2010  . COSMETIC SURGERY     laceration repair  . KNEE ARTHROSCOPY Left 02/09/2014   Procedure: LEFT KNEE EXCISIONAL BIOPSY;  Surgeon: Sydnee Cabal, MD;  Location: [facility];  Service: Orthopedics;  Laterality: Left;  . KNEE ARTHROSCOPY W/ LATERAL RELEASE Left 09-16-2011   PATELLA MALTRACKING  . MYRINGOTOMY     22mo 6 mos  . TONSILLECTOMY  1993    Social History   Tobacco Use  . Smoking status: Never Smoker  . Smokeless tobacco: Never Used  Substance Use Topics  . Alcohol use: No  . Drug use:  No    Allergies  Allergen Reactions  . Percocet [Oxycodone-Acetaminophen] Itching  . Vicodin [Hydrocodone-Acetaminophen] Itching    Current Outpatient Medications  Medication Sig Dispense Refill  . calcium carbonate (TUMS - DOSED IN MG ELEMENTAL CALCIUM) 500 MG chewable tablet Chew 1 tablet by mouth 2 (two) times daily as needed for indigestion or heartburn.    .Marland Kitchenibuprofen (ADVIL,MOTRIN) 600 MG tablet Take 1 tablet (600 mg total) by mouth every 6 (six) hours as needed for moderate pain or cramping. 40 tablet 1  . omega-3 acid ethyl esters (LOVAZA) 1 G capsule Take 2 g by mouth daily.    . pantoprazole (PROTONIX) 40 MG tablet Take 40 mg by mouth daily.    . Prenatal Vit-Fe Fumarate-FA (PRENATAL MULTIVITAMIN) TABS tablet Take 1 tablet by mouth daily at 12 noon.     No current facility-administered medications for this visit.     Review of Systems  Constitutional: Negative.   HENT: Negative.   Eyes: Negative.   Respiratory: Negative.   Cardiovascular: Negative.   Gastrointestinal: Negative.   Genitourinary: Negative.   Musculoskeletal: Negative.   Skin: Negative.   Neurological: Negative.   Endo/Heme/Allergies: Negative.   Psychiatric/Behavioral: Negative.      Objective:  BP 100/60   Pulse 92   Temp 98.3 F (36.8 C)   Ht 5' 8"  (1.727 m)  Wt 130 lb 6.4 oz (59.1 kg)   SpO2 98%   BMI 19.83 kg/m   General Appearance:  Alert, cooperative, no distress, appears stated age  Head:  Normocephalic, without obvious abnormality, atraumatic  Eyes:  PERRL, conjunctiva/corneas clear, EOM's intact, fundi benign, both eyes  Ears:  Normal TM's and external ear canals, both ears  Nose: Nares normal, septum midline,mucosa normal, no drainage or sinus tenderness  Throat: Lips, mucosa, and tongue normal; teeth and gums normal  Neck: Supple, symmetrical, trachea midline, no adenopathy;  thyroid: not enlarged, symmetric, no tenderness/mass/nodules; no carotid bruit or JVD  Back:    Symmetric, no curvature, ROM normal, no CVA tenderness  Lungs:   Clear to auscultation bilaterally, respirations unlabored  Breasts:  Deferred  Heart:  Regular rate and rhythm, S1 and S2 normal, no murmur, rub, or gallop  Abdomen:   Soft, non-tender, bowel sounds active all four quadrants,  no masses, no organomegaly  Pelvic: Deferred  Extremities: Extremities normal, atraumatic, no cyanosis or edema  Pulses: 2+ and symmetric  Skin: Skin color, texture, turgor normal, no rashes or lesions  Lymph nodes: Cervical, supraclavicular nodes normal  Neurologic: Normal     Assessment:  basic physical exam    Plan:  Patient education provided. The patient was instructed to follow up with her PCP for labwork or further diagnostic screening tests.  The patient was provided education on health maintenance, health prevention and preventing hypertension.  The patient does not voice any other concerns today. The patient verbalizes understanding and has no questions at time of discharge. Follow up as needed.

## 2019-06-10 ENCOUNTER — Other Ambulatory Visit: Payer: Self-pay

## 2019-06-10 ENCOUNTER — Ambulatory Visit (INDEPENDENT_AMBULATORY_CARE_PROVIDER_SITE_OTHER): Payer: No Typology Code available for payment source | Admitting: Psychiatry

## 2019-06-10 DIAGNOSIS — Z0282 Encounter for adoption services: Secondary | ICD-10-CM | POA: Diagnosis not present

## 2019-06-10 NOTE — Progress Notes (Signed)
PSYCHOLOGICAL EVALUATION FOR PRE-ADOPTION Betty Moore, PhD LP [facility]  Name: Betty Long Date: 06/10/2019 Time spent: 120 min MRN: [id number] DOB: 12-12-1985 Guardian/Payee: self PCP: Betty Frees, MD Documentation requested on this visit: Yes  PROBLEM HISTORY Reason for Visit /Presenting Problem:  Chief Complaint  Patient presents with  . Other    Pre-adoption assessment   Referred by self and husband for evaluation of psychological suitability for international adoption.  [name] is not specifically assessed today for identification and treatment of a diagnosable psychiatric condition, but her insurance company has assured staff that the service is billable for this purpose as a standard psychiatric evaluation (CPT [phone]).  She was screened for psychopathology warranting treatment, with none found, and no risk factors found for dangerous behavior, substance abuse, abuse history, or risk of perpetrating.  Specific documentation to be prepared separately for report to adoption agency.  See that report for more specific findings in the specified format.  MENTAL STATUS AND OBSERVATIONS Appearance:   Casual and Neat     Behavior:  Appropriate  Motor:  Normal  Speech/Language:   Clear and Coherent  Affect:  Appropriate  Mood:  normal  Thought process:  normal  Thought content:    WNL  Sensory/Perceptual disturbances:    WNL  Orientation:  Fully oriented  Attention:  Good  Concentration:  Good  Memory:  WNL  Fund of knowledge:   Good  Insight:    Good  Judgment:   Good  Impulse Control:  Good  Initial Risk Assessment: Danger to Self: No Self-injurious Behavior: No Danger to Others: No Physical Aggression / Violence: No Duty to Warn: No Access to Firearms a concern: No Gang Involvement: No Patient / guardian was educated about steps to take if suicide or homicide risk level increases between visits: yes . While future psychiatric events  cannot be accurately predicted, the patient does not currently require acute inpatient psychiatric care and does not currently meet [facility] involuntary commitment criteria.   DIAGNOSIS:    ICD-10-CM   1. Pre-adoption interview  Z02.82     Plan: Document interview findings as specified by agency and transmit for approval.  PT will provide relevant ROI form and be in touch if further actions or any treatment are indicated.  Betty Serve, PhD  Betty Moore, PhD LP Clinical Psychologist, [facility] [address] [place], [place] [address] [phone]

## 2019-12-07 ENCOUNTER — Ambulatory Visit: Payer: No Typology Code available for payment source | Attending: Internal Medicine

## 2019-12-07 DIAGNOSIS — Z20822 Contact with and (suspected) exposure to covid-19: Secondary | ICD-10-CM

## 2019-12-08 LAB — NOVEL CORONAVIRUS, NAA: SARS-CoV-2, NAA: NOT DETECTED

## 2019-12-14 MED FILL — FLUTICASONE PROP 50 MCG SPR: 50 | 30 days supply | Qty: 16 | Fill #0

## 2020-03-12 ENCOUNTER — Ambulatory Visit: Payer: No Typology Code available for payment source | Attending: Internal Medicine

## 2020-03-12 DIAGNOSIS — Z20822 Contact with and (suspected) exposure to covid-19: Secondary | ICD-10-CM

## 2020-03-14 LAB — NOVEL CORONAVIRUS, NAA: SARS-CoV-2, NAA: NOT DETECTED

## 2020-03-14 LAB — SARS-COV-2, NAA 2 DAY TAT

## 2020-12-07 DIAGNOSIS — Z0282 Encounter for adoption services: Secondary | ICD-10-CM | POA: Diagnosis not present

## 2020-12-25 ENCOUNTER — Other Ambulatory Visit (HOSPITAL_BASED_OUTPATIENT_CLINIC_OR_DEPARTMENT_OTHER): Payer: Self-pay | Admitting: Physician Assistant

## 2020-12-25 DIAGNOSIS — U071 COVID-19: Secondary | ICD-10-CM | POA: Diagnosis not present

## 2020-12-25 DIAGNOSIS — H6690 Otitis media, unspecified, unspecified ear: Secondary | ICD-10-CM | POA: Diagnosis not present

## 2020-12-25 MED FILL — AMOX TR-K CLV 875-125 MG TA: 875-125 | 5 days supply | Qty: 10 | Fill #0

## 2021-04-29 DIAGNOSIS — Z20822 Contact with and (suspected) exposure to covid-19: Secondary | ICD-10-CM | POA: Diagnosis not present

## 2021-04-30 ENCOUNTER — Other Ambulatory Visit (HOSPITAL_BASED_OUTPATIENT_CLINIC_OR_DEPARTMENT_OTHER): Payer: Self-pay

## 2021-04-30 MED ORDER — AMOXICILLIN 400 MG/5ML PO SUSR
ORAL | 0 refills | Status: DC
  Filled 2021-04-30: qty 100, 5d supply, fill #0

## 2021-05-21 DIAGNOSIS — Z01419 Encounter for gynecological examination (general) (routine) without abnormal findings: Secondary | ICD-10-CM | POA: Diagnosis not present

## 2021-05-21 DIAGNOSIS — Z6820 Body mass index (BMI) 20.0-20.9, adult: Secondary | ICD-10-CM | POA: Diagnosis not present

## 2021-05-21 DIAGNOSIS — Z1389 Encounter for screening for other disorder: Secondary | ICD-10-CM | POA: Diagnosis not present

## 2021-05-21 DIAGNOSIS — Z130 Encounter for screening for diseases of the blood and blood-forming organs and certain disorders involving the immune mechanism: Secondary | ICD-10-CM | POA: Diagnosis not present

## 2021-05-21 DIAGNOSIS — Z13220 Encounter for screening for lipoid disorders: Secondary | ICD-10-CM | POA: Diagnosis not present

## 2021-05-21 DIAGNOSIS — Z1321 Encounter for screening for nutritional disorder: Secondary | ICD-10-CM | POA: Diagnosis not present

## 2021-05-21 DIAGNOSIS — E559 Vitamin D deficiency, unspecified: Secondary | ICD-10-CM | POA: Diagnosis not present

## 2021-05-21 DIAGNOSIS — Z13228 Encounter for screening for other metabolic disorders: Secondary | ICD-10-CM | POA: Diagnosis not present

## 2021-05-21 DIAGNOSIS — Z803 Family history of malignant neoplasm of breast: Secondary | ICD-10-CM | POA: Diagnosis not present

## 2021-06-05 ENCOUNTER — Telehealth: Payer: Self-pay | Admitting: Genetic Counselor

## 2021-06-05 NOTE — Telephone Encounter (Signed)
Received a genetic counseling referral from Dr. Melba Coon for fhx of breast cancer. Betty Long has been cld and scheduled to see Betty Long on 7/21 at 10am. Pt aware to arrive 15 minutes early.

## 2021-06-20 ENCOUNTER — Inpatient Hospital Stay: Payer: 59

## 2021-06-20 ENCOUNTER — Inpatient Hospital Stay: Payer: 59 | Attending: Genetic Counselor | Admitting: Genetic Counselor

## 2021-06-20 ENCOUNTER — Other Ambulatory Visit: Payer: Self-pay

## 2021-06-20 DIAGNOSIS — Z808 Family history of malignant neoplasm of other organs or systems: Secondary | ICD-10-CM

## 2021-06-20 DIAGNOSIS — Z803 Family history of malignant neoplasm of breast: Secondary | ICD-10-CM

## 2021-06-20 LAB — GENETIC SCREENING ORDER

## 2021-06-21 ENCOUNTER — Encounter: Payer: Self-pay | Admitting: Genetic Counselor

## 2021-06-21 DIAGNOSIS — Z803 Family history of malignant neoplasm of breast: Secondary | ICD-10-CM | POA: Insufficient documentation

## 2021-06-21 DIAGNOSIS — Z808 Family history of malignant neoplasm of other organs or systems: Secondary | ICD-10-CM | POA: Insufficient documentation

## 2021-06-21 NOTE — Progress Notes (Signed)
REFERRING PROVIDER: Janyth Contes, MD [address] [place],  [place] [address]  PRIMARY PROVIDER:  Shirline Frees, MD  PRIMARY REASON FOR VISIT:  1. Family history of breast cancer   2. Family history of melanoma      HISTORY OF PRESENT ILLNESS:   Betty Long, a 35 y.o. female, was seen for a [facility] cancer genetics consultation at the request of Dr. Sandford Craze due to a family history of cancer.  Betty Long presents to clinic today to discuss the possibility of a hereditary predisposition to cancer, genetic testing, and to further clarify her future cancer risks, as well as potential cancer risks for family members.   Betty Long does not have a personal history of cancer.    RISK FACTORS:  Menarche was at age 77.  First live birth at age 45.  OCP use for  less than 1  year.  Ovaries intact: yes.  Hysterectomy: no.  Menopausal status: premenopausal.  HRT use: 0 years. Colonoscopy: n/a; not examined. Mammogram within the last year: n/a. Number of breast biopsies: 0. Up to date with pelvic exams: yes. Any excessive radiation exposure in the past: no  Past Medical History:  Diagnosis Date   Family history of breast cancer    Family history of melanoma    History of abnormal cervical Pap smear    ASCUS  ---  NEGATIVE HPV   Mass of left knee    Vaginal Pap smear, abnormal     Past Surgical History:  Procedure Laterality Date   ANTERIOR CRUCIATE LIGAMENT REPAIR Left 2010   COSMETIC SURGERY     laceration repair   KNEE ARTHROSCOPY Left 02/09/2014   Procedure: LEFT KNEE EXCISIONAL BIOPSY;  Surgeon: Sydnee Cabal, MD;  Location: [facility];  Service: Orthopedics;  Laterality: Left;   KNEE ARTHROSCOPY W/ LATERAL RELEASE Left 09-16-2011   PATELLA MALTRACKING   MYRINGOTOMY     64mo 6 mos   TONSILLECTOMY  1993    Social History   Socioeconomic History   Marital status: Married    Spouse name: Not on file   Number of  children: Not on file   Years of education: Not on file   Highest education level: Not on file  Occupational History   Not on file  Tobacco Use   Smoking status: Never   Smokeless tobacco: Never  Substance and Sexual Activity   Alcohol use: No   Drug use: No   Sexual activity: Not on file  Other Topics Concern   Not on file  Social History Narrative   Not on file   Social Determinants of Health   Financial Resource Strain: Not on file  Food Insecurity: Not on file  Transportation Needs: Not on file  Physical Activity: Not on file  Stress: Not on file  Social Connections: Not on file     FAMILY HISTORY:  We obtained a detailed, 4-generation family history.  Significant diagnoses are listed below: Family History  Problem Relation Age of Onset   Anemia Mother    Hypertension Father    Melanoma Father [phone]      ear   Hypertension Brother    Melanoma Maternal Aunt    Breast cancer Maternal Grandmother 451  Melanoma Maternal Grandfather        dx 50s/60s, metastatic   Betty Long two daughters (one age 9730and the other age 35 months- the youngest is adopted) and one son (age  3). She has one brother (age 54). None of these relatives have had cancer.  Betty Long mother is alive at age 54 without cancer. There are three maternal aunts and one maternal uncle. One aunt had melanoma. There is no known cancer among maternal cousins. Betty Long maternal grandmother is alive at age 39 and was diagnosed with breast cancer at age 63. Her maternal grandfather died in his early 35s with metastatic melanoma (initially diagnosed late 53s or early 35s).   Betty Long father is alive at age 59 and has a history of melanoma diagnosed around age 35 on his ear. There are two paternal aunts. There is no known cancer among paternal aunts or paternal cousins. Betty Long paternal grandmother is alive in her 68s without cancer. Her paternal grandfather died in his early 59s due to a car  accident without cancer.   Betty Long is unaware of previous family history of genetic testing for hereditary cancer risks. Patient's ancestors are of unknown descent. There is no reported Ashkenazi Jewish ancestry. There is no known consanguinity.  GENETIC COUNSELING ASSESSMENT: Betty Long is a 35 y.o. female with a family history of cancer which is not strongly suggestive of a hereditary cancer  and predisposition to cancer. We, therefore, discussed and recommended the following at today's visit.   DISCUSSION: We discussed that, in general, most cancer is not inherited in families, but instead is sporadic or familial. Sporadic cancers occur by chance and typically happen at older ages (>50 years) as this type of cancer is caused by genetic changes acquired during an individual's lifetime. Some families have more cancers than would be expected by chance; however, the ages or types of cancer are not consistent with a known genetic mutation or known genetic mutations have been ruled out. This type of familial cancer is thought to be due to a combination of multiple genetic, environmental, hormonal, and lifestyle factors. While this combination of factors likely increases the risk of cancer, the exact source of this risk is not currently identifiable or testable.    Approximately 5-10% of cancer is hereditary, meaning that it is due to a mutation in a single gene that is passed down from generation to generation in a family. Most hereditary cases of breast cancer are associated with the BRCA1 and BRCA2 genes. There are other genes that can be associated an increased risk for breast cancer, including ATM, CHEK2, PALB2, etc. We discussed that testing can be beneficial for several reasons, including knowing about other cancer risks, identifying potential screening and risk-reduction options that may be appropriate, and to understand if other family members could be at risk for cancer and allow them to undergo  genetic testing.   We reviewed the characteristics, features and inheritance patterns of hereditary cancer syndromes. We discussed with Betty Long that the family history does not meet insurance or NCCN criteria for genetic testing and, therefore, is not highly consistent with a familial hereditary cancer syndrome. We feel she is at low risk to harbor a gene mutation associated with such a condition. Betty Long is still interested in moving forward with genetic testing, and understands that the self-pay price will be $250 for the test.   Therefore, we discussed genetic testing, including the appropriate family members to test, the process of testing, insurance coverage and turn-around-time for results. We discussed the implications of a negative, positive and/or variant of uncertain significant result. We recommended Betty Long pursue genetic testing for the [facility] Multi-Cancer + RNA panel.  The Multi-Cancer + RNA Panel offered by [facility] includes sequencing and/or deletion/duplication analysis of the following 84 genes:  AIP*, ALK, APC*, ATM*, AXIN2*, BAP1*, BARD1*, BLM*, BMPR1A*, BRCA1*, BRCA2*, BRIP1*, CASR, CDC73*, CDH1*, CDK4, CDKN1B*, CDKN1C*, CDKN2A, CEBPA, CHEK2*, CTNNA1*, DICER1*, DIS3L2*, EGFR, EPCAM, FH*, FLCN*, GATA2*, GPC3, GREM1, HOXB13, HRAS, KIT, MAX*, MEN1*, MET, MITF, MLH1*, MSH2*, MSH3*, MSH6*, MUTYH*, NBN*, NF1*, NF2*, NTHL1*, PALB2*, PDGFRA, PHOX2B, PMS2*, POLD1*, POLE*, POT1*, PRKAR1A*, PTCH1*, PTEN*, RAD50*, RAD51C*, RAD51D*, RB1*, RECQL4, RET, RUNX1*, SDHA*, SDHAF2*, SDHB*, SDHC*, SDHD*, SMAD4*, SMARCA4*, SMARCB1*, SMARCE1*, STK11*, SUFU*, TERC, TERT, TMEM127*, Tp53*, TSC1*, TSC2*, VHL*, WRN*, and WT1.  RNA analysis is performed for * genes.   PLAN: After considering the risks, benefits, and limitations, Betty Long provided informed consent to pursue genetic testing and the blood sample was sent to [facility] for analysis of the Multi-Cancer + RNA panel. Results should  be available within approximately two-three weeks' time, at which point they will be disclosed by telephone to Ms. [email or web address], as will any additional recommendations warranted by these results. Betty Long will receive a summary of her genetic counseling visit and a copy of her results once available. This information will also be available in Epic.   Betty Long questions were answered to her satisfaction today. Our contact information was provided should additional questions or concerns arise. Thank you for the referral and allowing [place] to share in the care of your patient.   Clint Guy, [place], [facility] Licensed, Certified Dispensing [email or web address] Phone: [phone]  The patient was seen for a total of 30 minutes in face-to-face genetic counseling. Patient was seen alone. This patient was discussed with Drs. Magrinat, Lindi Adie and/or Burr Medico who agrees with the above.    _______________________________________________________________________ For Office Staff:  Number of people involved in session: 1 Was an Intern/ student involved with case: no

## 2021-07-07 DIAGNOSIS — Z1379 Encounter for other screening for genetic and chromosomal anomalies: Secondary | ICD-10-CM | POA: Insufficient documentation

## 2021-07-08 ENCOUNTER — Ambulatory Visit: Payer: Self-pay | Admitting: Genetic Counselor

## 2021-07-08 ENCOUNTER — Telehealth: Payer: Self-pay | Admitting: Genetic Counselor

## 2021-07-08 DIAGNOSIS — Z1379 Encounter for other screening for genetic and chromosomal anomalies: Secondary | ICD-10-CM

## 2021-07-08 NOTE — Progress Notes (Signed)
HPI:  Ms. Brandner was previously seen in the [facility] due to a family history of cancer and concerns regarding a hereditary predisposition to cancer. Please refer to our prior cancer genetics clinic note for more information regarding our discussion, assessment and recommendations, at the time. Ms. Bunkley recent genetic test results were disclosed to her, as were recommendations warranted by these results. These results and recommendations are discussed in more detail below.  FAMILY HISTORY:  We obtained a detailed, 4-generation family history.  Significant diagnoses are listed below: Family History  Problem Relation Age of Onset   Anemia Mother    Hypertension Father    Melanoma Father 70       ear   Hypertension Brother    Melanoma Maternal Aunt    Breast cancer Maternal Grandmother 7   Melanoma Maternal Grandfather        dx 50s/60s, metastatic   Ms. Hodgkins has two daughters (one age 35 and the other age 35 months - the youngest is adopted) and one son (age 35). She has one brother (age 35). None of these relatives have had cancer.   Ms. Gentles mother is alive at age 35 without cancer. There are three maternal aunts and one maternal uncle. One aunt had melanoma. There is no known cancer among maternal cousins. Ms. Sando maternal grandmother is alive at age 35 and was diagnosed with breast cancer at age 89. Her maternal grandfather died in his early 35s with metastatic melanoma (initially diagnosed late 35s or early 35s).    Ms. Pinkus father is alive at age 35 and has a history of melanoma diagnosed around age 35 on his ear. There are two paternal aunts. There is no known cancer among paternal aunts or paternal cousins. Ms. Kidd paternal grandmother is alive in her 35s without cancer. Her paternal grandfather died in his early 35s due to a car accident without cancer.    Ms. Diss is unaware of previous family history of genetic testing for  hereditary cancer risks. Patient's ancestors are of unknown descent. There is no reported Ashkenazi Jewish ancestry. There is no known consanguinity.  GENETIC TEST RESULTS: Genetic testing reported out on 07/07/2021 through the [facility] Multi-Cancer + RNA panel. No pathogenic variants were detected.   The Multi-Cancer + RNA Panel offered by [facility] includes sequencing and/or deletion/duplication analysis of the following 84 genes:  AIP*, ALK, APC*, ATM*, AXIN2*, BAP1*, BARD1*, BLM*, BMPR1A*, BRCA1*, BRCA2*, BRIP1*, CASR, CDC73*, CDH1*, CDK4, CDKN1B*, CDKN1C*, CDKN2A, CEBPA, CHEK2*, CTNNA1*, DICER1*, DIS3L2*, EGFR, EPCAM, FH*, FLCN*, GATA2*, GPC3, GREM1, HOXB13, HRAS, KIT, MAX*, MEN1*, MET, MITF, MLH1*, MSH2*, MSH3*, MSH6*, MUTYH*, NBN*, NF1*, NF2*, NTHL1*, PALB2*, PDGFRA, PHOX2B, PMS2*, POLD1*, POLE*, POT1*, PRKAR1A*, PTCH1*, PTEN*, RAD50*, RAD51C*, RAD51D*, RB1*, RECQL4, RET, RUNX1*, SDHA*, SDHAF2*, SDHB*, SDHC*, SDHD*, SMAD4*, SMARCA4*, SMARCB1*, SMARCE1*, STK11*, SUFU*, TERC, TERT, TMEM127*, Tp53*, TSC1*, TSC2*, VHL*, WRN*, and WT1.  RNA analysis is performed for * genes. The test report will be scanned into EPIC and located under the Molecular Pathology section of the Results Review tab.  A portion of the result report is included below for reference.     We discussed with Ms. Balsley that because current genetic testing is not perfect, it is possible there may be a gene mutation in one of these genes that current testing cannot detect, but that chance is small.  We also discussed that there could be another gene that has not yet been discovered, or that we have not yet tested,  that is responsible for the cancer diagnoses in the family. It is also possible there is a hereditary cause for the cancer in the family that Ms. Rocco did not inherit and therefore was not identified in her testing.  Therefore, it is important to remain in touch with cancer genetics in the future so that we can continue to offer  Ms. Michna the most up to date genetic testing.   ADDITIONAL GENETIC TESTING: We discussed with Ms. Lemarr that her genetic testing was fairly extensive.  If there are genes identified to increase cancer risk that can be analyzed in the future, we would be happy to discuss and coordinate this testing at that time.    CANCER SCREENING RECOMMENDATIONS: Ms. Privett test result is considered negative (normal).  This means that we have not identified a hereditary cause for her family history of cancer at this time. Most cancers happen by chance and this negative test suggests that her family history of cancer may fall into this category.    While reassuring, this does not definitively rule out a hereditary predisposition to cancer. It is still possible that there could be genetic mutations that are undetectable by current technology. There could be genetic mutations in genes that have not been tested or identified to increase cancer risk.  Therefore, it is recommended she continue to follow the cancer management and screening guidelines provided by her primary healthcare provider.   An individual's cancer risk and medical management are not determined by genetic test results alone. Overall cancer risk assessment incorporates additional factors, including personal medical history, family history, and any available genetic information that may result in a personalized plan for cancer prevention and surveillance.  Breast Cancer Risk: Based on Ms. MBWGYKZ'L personal and family history, as well as her genetic test results, the Tyrer-Cuzick statistical model was used to estimate her risk of developing breast cancer. Tyrer-Cuzick estimates her lifetime risk of developing breast cancer to be approximately 17.2%. This lifetime breast cancer risk is a preliminary estimate based on available information using one of several models endorsed by the [place] (ACS). The ACS recommends consideration of  breast MRI screening as an adjunct to mammography for patients at high risk (defined as 20% or greater lifetime risk). A more detailed breast cancer risk assessment can be considered, if clinically indicated. This risk estimate can change over time and that this calculation may be repeated to reflect new information in her personal or family history in the future.     RECOMMENDATIONS FOR FAMILY MEMBERS:  Individuals in this family might be at some increased risk of developing cancer, over the general population risk, simply due to the family history of cancer. We recommended women in this family have a yearly mammogram beginning at age 57, or 71 years younger than the earliest onset of cancer, an annual clinical breast exam, and perform monthly breast self-exams. Women in this family should also have a gynecological exam as recommended by their primary provider. All family members should be referred for colonoscopy starting at age 40.  FOLLOW-UP: Lastly, we discussed with Ms. Hinds that cancer genetics is a rapidly advancing field and it is possible that new genetic tests will be appropriate for her and/or her family members in the future. We encouraged her to remain in contact with cancer genetics on an annual basis so we can update her personal and family histories and let her know of advances in cancer genetics that may benefit this family.  Our contact number was provided. Ms. Outland questions were answered to her satisfaction, and she knows she is welcome to call us at anytime with additional questions or concerns.   Clint Guy, MS, [facility] Genetic Counselor [email or web address] Phone: [phone]

## 2021-07-08 NOTE — Telephone Encounter (Signed)
Revealed negative genetic testing. Discussed that we do not know why there is cancer in the family. It is possible that there could be a mutation in a different gene that we are not testing, or our current technology may not be able to detect certain mutations. It will therefore be important for her to stay in contact with genetics to keep up with whether additional testing may be appropriate in the future.

## 2022-03-09 ENCOUNTER — Other Ambulatory Visit: Payer: Self-pay

## 2022-03-09 ENCOUNTER — Emergency Department (HOSPITAL_COMMUNITY): Payer: No Typology Code available for payment source

## 2022-03-09 ENCOUNTER — Emergency Department (HOSPITAL_COMMUNITY)
Admission: EM | Admit: 2022-03-09 | Discharge: 2022-03-09 | Disposition: A | Discharge status: Home / Self Care | Payer: No Typology Code available for payment source | Attending: Emergency Medicine | Admitting: Emergency Medicine

## 2022-03-09 ENCOUNTER — Encounter (HOSPITAL_COMMUNITY): Payer: Self-pay | Admitting: Emergency Medicine

## 2022-03-09 DIAGNOSIS — R002 Palpitations: Secondary | ICD-10-CM | POA: Diagnosis not present

## 2022-03-09 DIAGNOSIS — M542 Cervicalgia: Secondary | ICD-10-CM | POA: Insufficient documentation

## 2022-03-09 DIAGNOSIS — R519 Headache, unspecified: Secondary | ICD-10-CM | POA: Diagnosis present

## 2022-03-09 LAB — CBC WITH DIFFERENTIAL/PLATELET
Abs Immature Granulocytes: 0.02 10*3/uL (ref 0.00–0.07)
Basophils Absolute: 0 10*3/uL (ref 0.0–0.1)
Basophils Relative: 1 %
Eosinophils Absolute: 0 10*3/uL (ref 0.0–0.5)
Eosinophils Relative: 1 %
HCT: 39 % (ref 36.0–46.0)
Hemoglobin: 12.8 g/dL (ref 12.0–15.0)
Immature Granulocytes: 0 %
Lymphocytes Relative: 24 %
Lymphs Abs: 1.6 10*3/uL (ref 0.7–4.0)
MCH: 30 pg (ref 26.0–34.0)
MCHC: 32.8 g/dL (ref 30.0–36.0)
MCV: 91.3 fL (ref 80.0–100.0)
Monocytes Absolute: 0.6 10*3/uL (ref 0.1–1.0)
Monocytes Relative: 9 %
Neutro Abs: 4.2 10*3/uL (ref 1.7–7.7)
Neutrophils Relative %: 65 %
Platelets: 243 10*3/uL (ref 150–400)
RBC: 4.27 MIL/uL (ref 3.87–5.11)
RDW: 13.9 % (ref 11.5–15.5)
WBC: 6.4 10*3/uL (ref 4.0–10.5)
nRBC: 0 % (ref 0.0–0.2)

## 2022-03-09 LAB — BASIC METABOLIC PANEL
Anion gap: 8 (ref 5–15)
BUN: 9 mg/dL (ref 6–20)
CO2: 22 mmol/L (ref 22–32)
Calcium: 9 mg/dL (ref 8.9–10.3)
Chloride: 109 mmol/L (ref 98–111)
Creatinine, Ser: 0.75 mg/dL (ref 0.44–1.00)
GFR, Estimated: >60 mL/min (ref >60)
Glucose, Bld: 85 mg/dL (ref 70–99)
Potassium: 3.7 mmol/L (ref 3.5–5.1)
Sodium: 139 mmol/L (ref 135–145)

## 2022-03-09 LAB — POC URINE PREG, ED: Preg Test, Ur: NEGATIVE

## 2022-03-09 LAB — MAGNESIUM: Magnesium: 2.1 mg/dL (ref 1.7–2.4)

## 2022-03-09 MED ORDER — METHOCARBAMOL 500 MG PO TABS: 500.0000 mg | ORAL_TABLET | Freq: Two times a day (BID) | ORAL | 0 refills | Status: AC

## 2022-03-09 MED ORDER — DIPHENHYDRAMINE HCL 50 MG/ML IJ SOLN
12.5000 mg | Freq: Once | INTRAMUSCULAR | Status: AC
  Administered 2022-03-09: 12.5 mg via INTRAVENOUS
  Filled 2022-03-09: qty 1

## 2022-03-09 MED ORDER — SODIUM CHLORIDE 0.9 % IV BOLUS
500.0000 mL | Freq: Once | INTRAVENOUS | Status: AC
  Administered 2022-03-09: 500 mL via INTRAVENOUS

## 2022-03-09 MED ORDER — LIDOCAINE 5 % EX PTCH: 1.0000 | MEDICATED_PATCH | CUTANEOUS | 0 refills | Status: DC

## 2022-03-09 MED ORDER — METOCLOPRAMIDE HCL 5 MG/ML IJ SOLN
5.0000 mg | Freq: Once | INTRAMUSCULAR | Status: AC
  Administered 2022-03-09: 5 mg via INTRAVENOUS
  Filled 2022-03-09: qty 2

## 2022-03-09 MED ORDER — LIDOCAINE 5 % EX PTCH
1.0000 | MEDICATED_PATCH | CUTANEOUS | Status: DC
  Administered 2022-03-09: 1 via TRANSDERMAL
  Filled 2022-03-09: qty 1

## 2022-03-09 NOTE — ED Notes (Signed)
Patient transported to CT 

## 2022-03-09 NOTE — ED Triage Notes (Signed)
Pt reports headache x 2 days.  Reports neck pain and L arm pain since last night.  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  States she had a hard time focusing this morning and was having floaters and palpitations.  Pt tearful and anxious. ?

## 2022-03-09 NOTE — ED Provider Notes (Addendum)
?[facility] ?Provider Note ? ? ?CSN: [id number] ?Arrival date & time: 03/09/22  0736 ? ?  ? ?History ? ?Chief Complaint  ?Patient presents with  ? Neck Pain  ? Headache  ? Palpitations  ? ? ?Betty Long is a 36 y.o. female who reports herself as otherwise healthy no daily medication use presented today for evaluation of headache.  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  Patient reports that she has attempted both ibuprofen and Tylenol for this headache with temporary improvement.  Patient reports that her headache feels like it has moved to her left trapezius muscle.  Patient reports that while she was working last night her pain was severe and she was having a hard time focusing on her work.  Patient feels like she briefly saw "floaters" move across her vision from left to right this lasted only for a second before resolving.  Patient reports no current visual symptoms.  Patient reports that she has experienced headaches in the past but these are normally due to lack of caffeine, she reports headache feels different today and is more severe and more posterior in location.  Patient reports that over the last day she has also noticed an occasional palpitation, a fluttering sensation in her chest which resolved spontaneously, no current palpitations. ? ?Patient denies fall/injury, fever, chills, recent illness, dizziness, vomiting, chest pain/shortness of breath, extremity weakness, numbness, tingling, sore throat or any additional concerns. ? ?HPI ? ?  ? ?Home Medications ?Prior to Admission medications   ?Medication Sig Start Date End Date Taking? Authorizing Provider  ?lidocaine (LIDODERM) 5 % Place 1 patch onto the skin daily. Remove & Discard patch within  12 hours or as directed by MD 03/09/22  Yes Nuala Alpha A, PA-C  ?methocarbamol (ROBAXIN) 500 MG tablet Take 1 tablet (500 mg total) by mouth 2 (two) times daily for 7 days. 03/09/22 03/16/22 Yes Nuala Alpha A, PA-C  ?amoxicillin (AMOXIL) 400 MG/5ML suspension Take 2.35ms by mouth 2 times daily for 10 days 03/26/21   DHarrie Jeans MD  ?calcium carbonate (TUMS - DOSED IN MG ELEMENTAL CALCIUM) 500 MG chewable tablet Chew 1 tablet by mouth 2 (two) times daily as needed for indigestion or heartburn.    [provider]  ?ibuprofen (ADVIL,MOTRIN) 600 MG tablet Take 1 tablet (600 mg total) by mouth every 6 (six) hours as needed for moderate pain or cramping. 10/24/17   BSherlyn Hay DO  ?omega-3 acid ethyl esters (LOVAZA) 1 G capsule Take 2 g by mouth daily.    [provider]  ?pantoprazole (PROTONIX) 40 MG tablet Take 40 mg by mouth daily.    [provider]  ?Prenatal Vit-Fe Fumarate-FA (PRENATAL MULTIVITAMIN) TABS tablet Take 1 tablet by mouth daily at 12 noon.    [provider]  ?   ? ?Allergies    ?Percocet [oxycodone-acetaminophen] and Vicodin [hydrocodone-acetaminophen]   ? ?Review of Systems   ?Review of Systems Ten systems are reviewed and are negative for acute change except as noted in the HPI ? ?Physical Exam ?Updated Vital Signs ?BP 106/67   Pulse 71   Temp 97.8 ?F (36.6 ?C) (Oral)   Resp 14   LMP 03/02/2022   SpO2 97%  ?Physical Exam ?Constitutional:   ?  General: She is not in acute distress. ?   Appearance: Normal appearance. She is well-developed. She is not ill-appearing or diaphoretic.  ?HENT:  ?   Head: Normocephalic and atraumatic.  ?Eyes:  ?   General: Vision grossly intact. Gaze aligned appropriately.  ?   Extraocular Movements: Extraocular movements intact.  ?   Conjunctiva/sclera: Conjunctivae normal.  ?   Pupils: Pupils are equal, round, and reactive to light.  ?   Slit lamp exam: ?   Right eye: Anterior chamber quiet.  ?   Left eye: Anterior  chamber quiet.  ?   Visual Fields: Right eye visual fields normal and left eye visual fields normal.  ?Neck:  ?   Trachea: Trachea and phonation normal.  ?   Meningeal: Brudzinski's sign absent.  ? ?   Comments: No overlying skin changes.  No evidence of trauma.  No midline spinal tenderness palpation.  No crepitus step-off or deformity of the cervical or thoracic spine.  Tenderness palpation and palpable muscle spasm of the left trapezius.  Additional mild tenderness noted at the left rhomboids and parascapular muscles. ?Cardiovascular:  ?   Rate and Rhythm: Normal rate and regular rhythm.  ?   Pulses:     ?     Radial pulses are 2+ on the right side and 2+ on the left side.  ?     Dorsalis pedis pulses are 2+ on the right side and 2+ on the left side.  ?   Heart sounds: Normal heart sounds.  ?Pulmonary:  ?   Effort: Pulmonary effort is normal. No respiratory distress.  ?   Breath sounds: Normal breath sounds and air entry.  ?Abdominal:  ?   General: There is no distension.  ?   Palpations: Abdomen is soft.  ?   Tenderness: There is no abdominal tenderness. There is no guarding or rebound.  ?Musculoskeletal:     ?   General: Normal range of motion.  ?   Cervical back: Normal range of motion and neck supple. No rigidity. Muscular tenderness present. No spinous process tenderness.  ?Skin: ?   General: Skin is warm and dry.  ?Neurological:  ?   Mental Status: She is alert.  ?   GCS: GCS eye subscore is 4. GCS verbal subscore is 5. GCS motor subscore is 6.  ?   Comments: Mental Status:  ?Alert, oriented, thought content appropriate, able to give a coherent history. Speech fluent without evidence of aphasia. Able to follow 2 step commands without difficulty.  ?Cranial Nerves:  ?II:  Peripheral visual fields grossly normal, pupils equal, round, reactive to light ?III,IV, VI: ptosis not present, extra-ocular motions intact bilaterally  ?V,VII: smile symmetric, eyebrows raise symmetric, facial light touch sensation  equal ?VIII: hearing grossly normal to voice  ?X: uvula elevates symmetrically  ?XI: bilateral shoulder shrug symmetric and strong ?XII: midline tongue extension without fassiculations ?Motor:  ?Normal tone. 5/5 strength in upper and lower extremities bilaterally including strong and equal grip strength and dorsiflexion/plantar flexion ?Sensory: Sensation intact to light touch in all extremities. Negative Romberg.  ?Cerebellar: normal finger-to-nose with bilateral upper extremities. Normal heel-to -shin balance bilaterally of the lower extremity. No pronator drift.  ?Gait: normal gait and balance ?CV: distal pulses palpable throughout  ?Psychiatric:     ?   Behavior: Behavior normal.  ? ? ?ED Results / Procedures / Treatments   ?Labs ?(all labs ordered are listed, but only abnormal results are displayed) ?Labs Reviewed  ?CBC  WITH DIFFERENTIAL/PLATELET  ?BASIC METABOLIC PANEL  ?MAGNESIUM  ?POC URINE PREG, ED  ? ? ?EKG ?EKG Interpretation ? ?Date/Time:  Sunday March 09 2022 07:31:33 EDT ?Ventricular Rate:  80 ?PR Interval:  134 ?QRS Duration: 70 ?QT Interval:  342 ?QTC Calculation: 394 ?R Axis:   87 ?Text Interpretation: Normal sinus rhythm Minimal voltage criteria for LVH, may be normal variant ( Sokolow-Lyon ) Borderline ECG When compared with ECG of 07-Aug-2011 11:38, No significant change since last tracing Confirmed by Gareth Morgan [phone]) on 03/09/2022 8:54:44 AM ? ?Radiology ?CT HEAD WO CONTRAST (5MM) ? ?Result Date: 03/09/2022 ?CLINICAL DATA:  Headache EXAM: CT HEAD WITHOUT CONTRAST TECHNIQUE: Contiguous axial images were obtained from the base of the skull through the vertex without intravenous contrast. RADIATION DOSE REDUCTION: This exam was performed according to the departmental dose-optimization program which includes automated exposure control, adjustment of the mA and/or kV according to patient size and/or use of iterative reconstruction technique. COMPARISON:  None. FINDINGS: Brain: No evidence of  acute infarction, hemorrhage, hydrocephalus, extra-axial collection or mass lesion/mass effect. Vascular: No hyperdense vessel or unexpected calcification. Skull: Normal. Negative for fracture or focal lesion. Sin

## 2022-03-09 NOTE — Discharge Instructions (Addendum)
At this time there does not appear to be the presence of an emergent medical condition, however there is always the potential for conditions to change. Please read and follow the below instructions. ? ?Please return to the Emergency Department immediately for any new or worsening symptoms. ?Please be sure to follow up with your Primary Care Provider within one week regarding your visit today; please call their office to schedule an appointment even if you are feeling better for a follow-up visit. ?You may use the muscle relaxer Robaxin as prescribed to help with your symptoms.  Do not drive or operate heavy machinery while taking Robaxin as it will make you drowsy.  Do not drink alcohol or take other sedating medications while taking Robaxin as this will worsen side effects. ?You may use the Lidoderm patch as prescribed to help with your symptoms.  Lidoderm may be expensive so you may speak with your pharmacist about finding over-the-counter medications that work similarly. ?You may continue taking over-the-counter ibuprofen and Tylenol as directed on the packaging to help with your symptoms. ? ?Go to the nearest Emergency Department immediately if: ?You have fever or chills ?Your headache: ?Gets very bad quickly. ?Gets worse after a lot of physical activity. ?You have any of these symptoms: ?You continue to vomit. ?A stiff neck. ?Trouble seeing. ?Your eye or ear hurts. ?Trouble speaking. ?Weak muscles or you lose muscle control. ?You lose your balance or have trouble walking. ?You feel like you will pass out (faint) or you pass out. ?You are mixed up (confused). ?You have a seizure. ?You have any new/concerning or worsening of symptoms. ? ? ?Please read the additional information packets attached to your discharge summary. ? ?Do not take your medicine if  develop an itchy rash, swelling in your mouth or lips, or difficulty breathing; call 911 and seek immediate emergency medical attention if this occurs. ? ?You may  review your lab tests and imaging results in their entirety on your MyChart account.  Please discuss all results of fully with your primary care provider and other specialist at your follow-up visit. ? ?Note: Portions of this text may have been transcribed using voice recognition software. Every effort was made to ensure accuracy; however, inadvertent computerized transcription errors may still be present. ? ?

## 2022-06-10 ENCOUNTER — Other Ambulatory Visit (HOSPITAL_COMMUNITY): Payer: Self-pay

## 2022-06-10 MED ORDER — FLUOXETINE HCL 10 MG PO CAPS
ORAL_CAPSULE | ORAL | 1 refills | Status: DC
  Filled 2022-06-10 – 2022-06-30 (×2): qty 45, 45d supply, fill #0

## 2022-06-23 ENCOUNTER — Other Ambulatory Visit (HOSPITAL_COMMUNITY): Payer: Self-pay

## 2022-06-30 ENCOUNTER — Other Ambulatory Visit (HOSPITAL_COMMUNITY): Payer: Self-pay

## 2022-07-01 ENCOUNTER — Other Ambulatory Visit (HOSPITAL_COMMUNITY): Payer: Self-pay

## 2022-07-01 MED ORDER — METRONIDAZOLE 500 MG PO TABS
500.0000 mg | ORAL_TABLET | Freq: Two times a day (BID) | ORAL | 0 refills | Status: DC
  Filled 2022-07-01: qty 14, 7d supply, fill #0

## 2022-07-01 MED ORDER — TRIAMCINOLONE ACETONIDE 0.1 % EX OINT
TOPICAL_OINTMENT | CUTANEOUS | 0 refills | Status: DC
  Filled 2022-07-01: qty 30, 15d supply, fill #0

## 2022-08-21 IMAGING — CT CT HEAD W/O CM
4 series · 15 of 47 positions shown, 17 images · non-contrast
Comparison: None.

CLINICAL DATA: Headache



[Series 2: head without · axial · non-contrast · 0.42mm/px · z∈[-87,+28]mm · 7 of 31 slices shown, 9 images]
[im 4/31  brain]
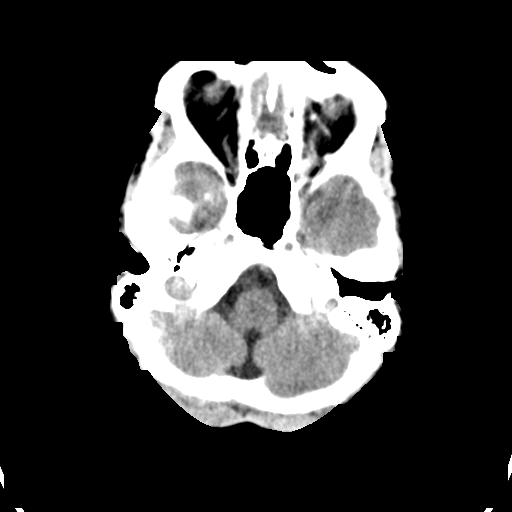
[im 4/31  bone]
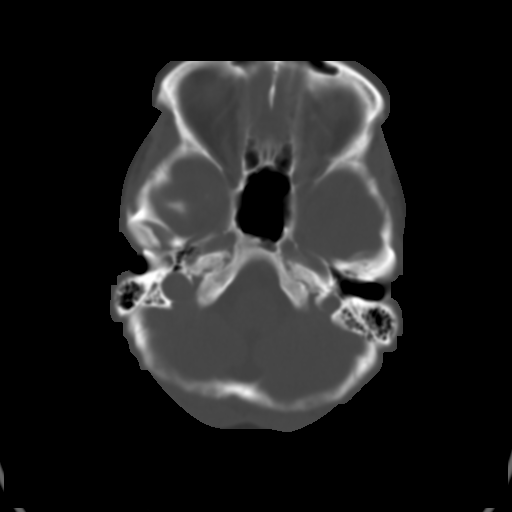
[im 8/31  brain]
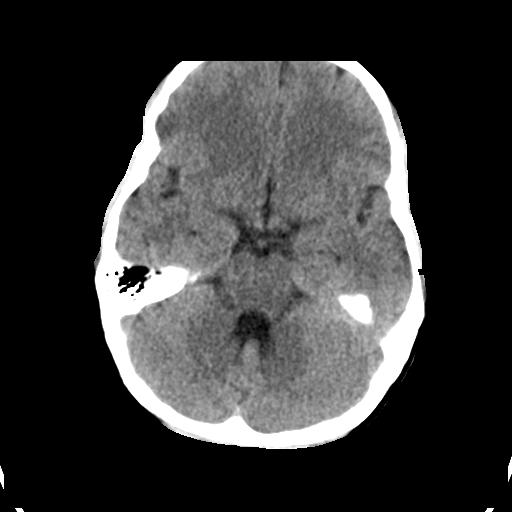
[im 12/31  brain]
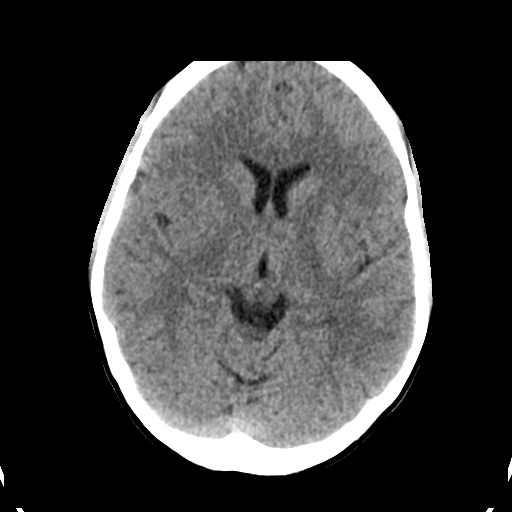
[im 16/31  brain]
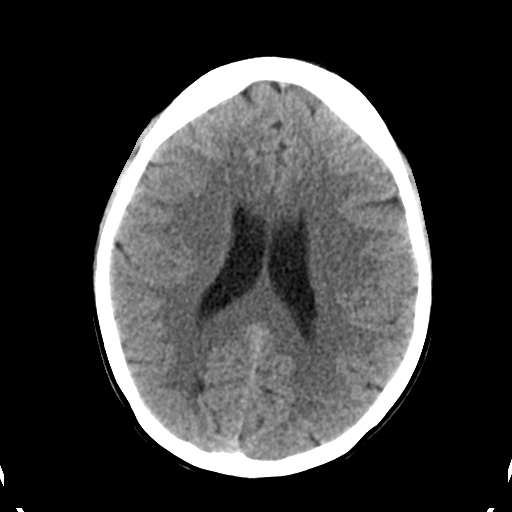
[im 19/31  brain]
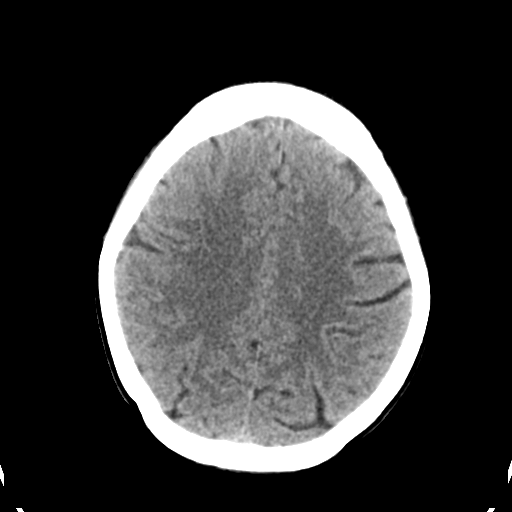
[im 19/31  bone]
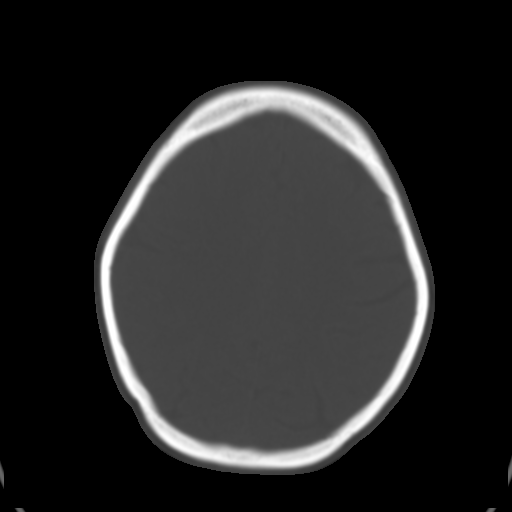
[im 23/31  brain]
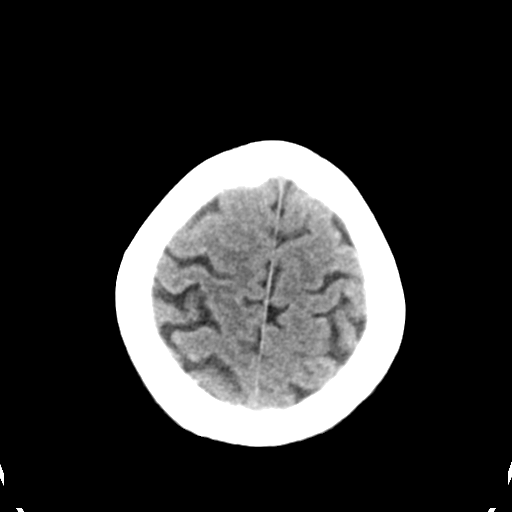
[im 27/31  brain]
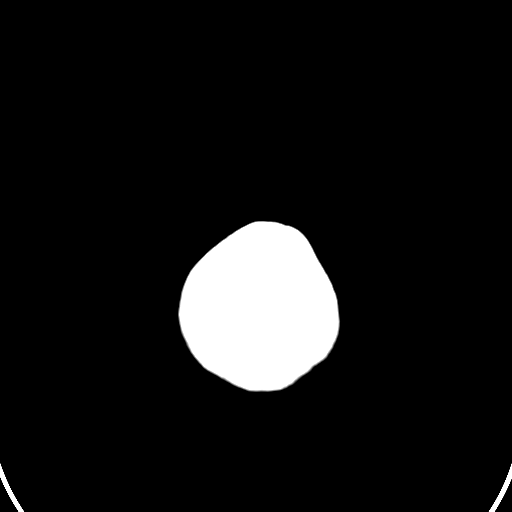

[Series 3: head bone · axial · 0.42mm/px · z∈[-88,-72]mm · 2 of 76 slices shown]
[im 8/76  bone]
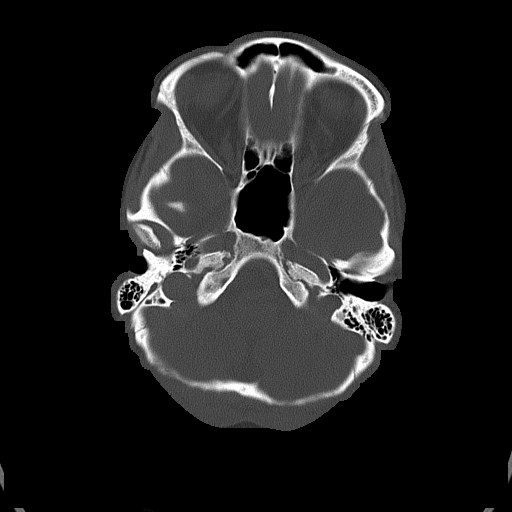
[im 16/76  bone]
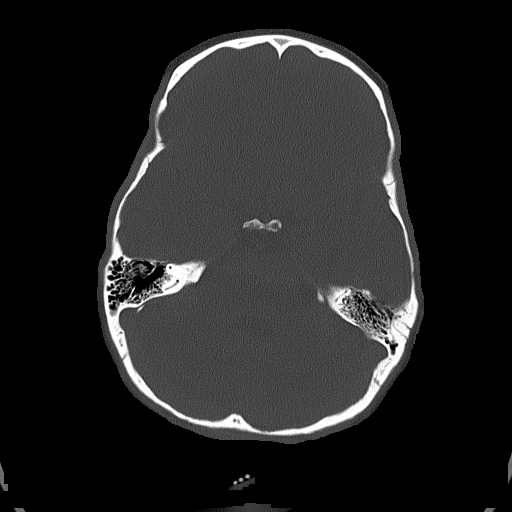

[Series 4: head without cor · coronal · non-contrast · 0.29mm/px · 3 of 66 slices shown]
[im 22/66  brain]
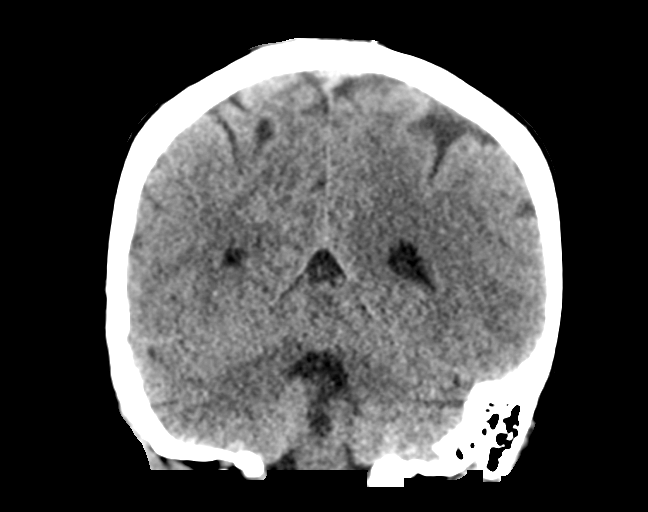
[im 29/66  brain]
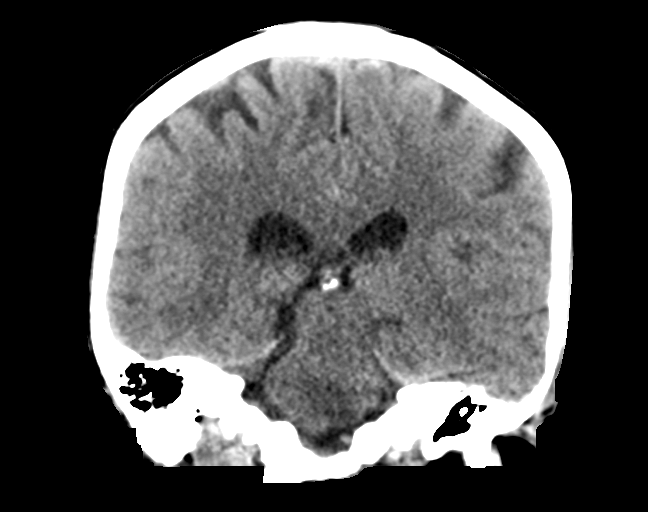
[im 37/66  brain]
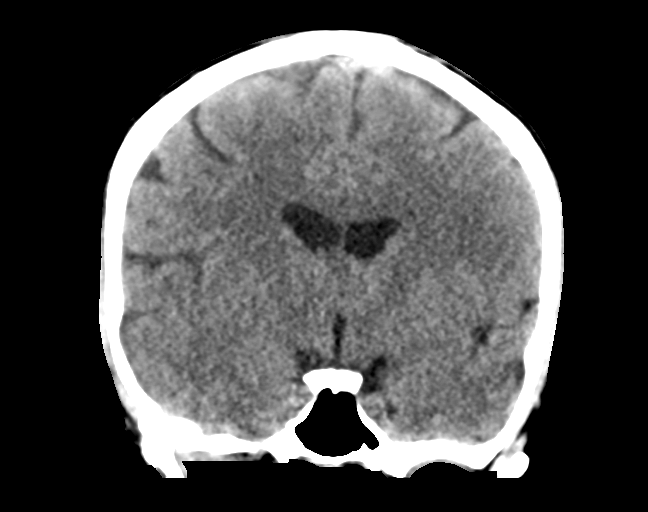

[Series 5: head without sag · sagittal · non-contrast · 0.30mm/px · 3 of 67 slices shown]
[im 23/67  brain]
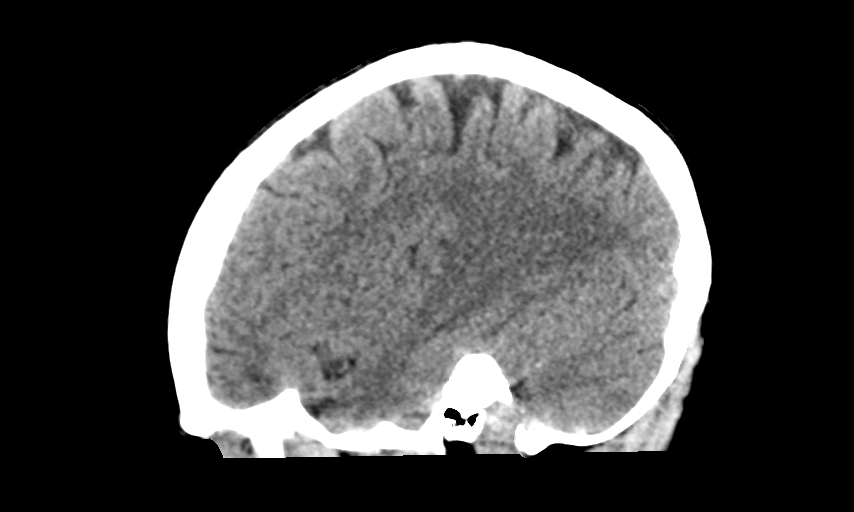
[im 34/67  brain]
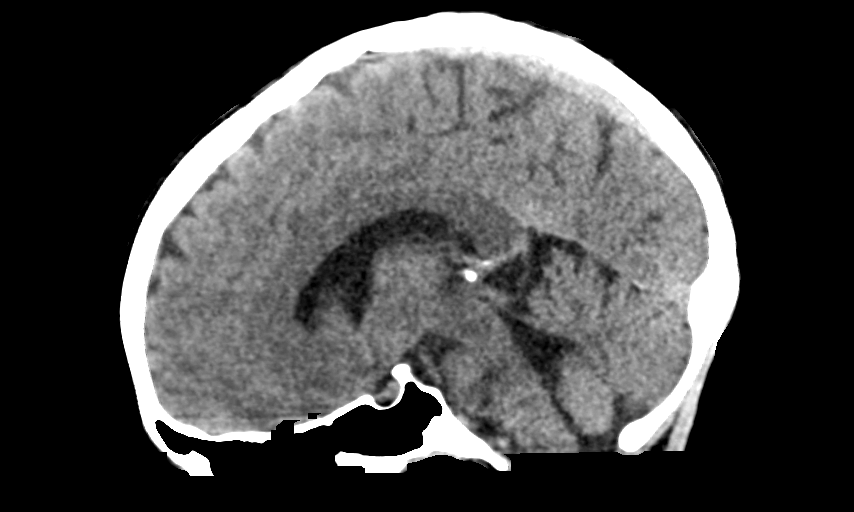
[im 45/67  brain]
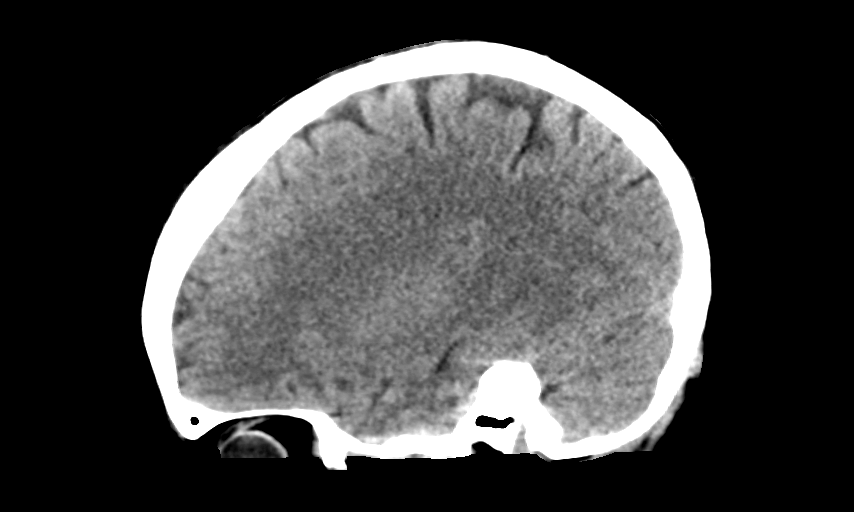

[15 of 47 positions shown; findings below may reference images not displayed]

FINDINGS: Brain: No evidence of acute infarction, hemorrhage, hydrocephalus,
extra-axial collection or mass lesion/mass effect.

Vascular: No hyperdense vessel or unexpected calcification.

Skull: Normal. Negative for fracture or focal lesion.

Sinuses/Orbits: No acute finding.

Other: None.
IMPRESSION: Negative head CT.

## 2022-11-30 ENCOUNTER — Encounter (HOSPITAL_COMMUNITY): Payer: Self-pay | Admitting: Emergency Medicine

## 2022-11-30 ENCOUNTER — Emergency Department (HOSPITAL_COMMUNITY)
Admission: EM | Admit: 2022-11-30 | Discharge: 2022-11-30 | Disposition: A | Discharge status: Home / Self Care | Payer: No Typology Code available for payment source | Attending: Emergency Medicine | Admitting: Emergency Medicine

## 2022-11-30 ENCOUNTER — Other Ambulatory Visit: Payer: Self-pay

## 2022-11-30 ENCOUNTER — Emergency Department (HOSPITAL_COMMUNITY): Payer: No Typology Code available for payment source

## 2022-11-30 ENCOUNTER — Emergency Department (HOSPITAL_BASED_OUTPATIENT_CLINIC_OR_DEPARTMENT_OTHER): Payer: No Typology Code available for payment source

## 2022-11-30 DIAGNOSIS — M25561 Pain in right knee: Secondary | ICD-10-CM | POA: Diagnosis present

## 2022-11-30 DIAGNOSIS — M7989 Other specified soft tissue disorders: Secondary | ICD-10-CM | POA: Insufficient documentation

## 2022-11-30 DIAGNOSIS — R609 Edema, unspecified: Secondary | ICD-10-CM | POA: Diagnosis not present

## 2022-11-30 LAB — CBC WITH DIFFERENTIAL/PLATELET
Abs Immature Granulocytes: 0.02 10*3/uL (ref 0.00–0.07)
Basophils Absolute: 0.1 10*3/uL (ref 0.0–0.1)
Basophils Relative: 1 %
Eosinophils Absolute: 0.1 10*3/uL (ref 0.0–0.5)
Eosinophils Relative: 2 %
HCT: 44 % (ref 36.0–46.0)
Hemoglobin: 14.2 g/dL (ref 12.0–15.0)
Immature Granulocytes: 0 %
Lymphocytes Relative: 31 %
Lymphs Abs: 2.6 10*3/uL (ref 0.7–4.0)
MCH: 29.8 pg (ref 26.0–34.0)
MCHC: 32.3 g/dL (ref 30.0–36.0)
MCV: 92.2 fL (ref 80.0–100.0)
Monocytes Absolute: 0.8 10*3/uL (ref 0.1–1.0)
Monocytes Relative: 9 %
Neutro Abs: 4.8 10*3/uL (ref 1.7–7.7)
Neutrophils Relative %: 57 %
Platelets: 255 10*3/uL (ref 150–400)
RBC: 4.77 MIL/uL (ref 3.87–5.11)
RDW: 14 % (ref 11.5–15.5)
WBC: 8.4 10*3/uL (ref 4.0–10.5)
nRBC: 0 % (ref 0.0–0.2)

## 2022-11-30 LAB — BASIC METABOLIC PANEL
Anion gap: 6 (ref 5–15)
BUN: 12 mg/dL (ref 6–20)
CO2: 23 mmol/L (ref 22–32)
Calcium: 9.5 mg/dL (ref 8.9–10.3)
Chloride: 105 mmol/L (ref 98–111)
Creatinine, Ser: 0.73 mg/dL (ref 0.44–1.00)
GFR, Estimated: >60 mL/min (ref >60)
Glucose, Bld: 97 mg/dL (ref 70–99)
Potassium: 4.1 mmol/L (ref 3.5–5.1)
Sodium: 134 mmol/L — ABNORMAL LOW (ref 135–145)

## 2022-11-30 LAB — D-DIMER, QUANTITATIVE: D-Dimer, Quant: <0.27 ug/mL-FEU (ref 0.00–0.50)

## 2022-11-30 LAB — HCG, QUANTITATIVE, PREGNANCY: hCG, Beta Chain, Quant, S: <1 m[IU]/mL (ref <5)

## 2022-11-30 NOTE — ED Notes (Signed)
Discharge instructions reviewed with patient. Patient denies any questions or concerns. Patient ambulatory out of ED.

## 2022-11-30 NOTE — ED Provider Notes (Signed)
[facility] EMERGENCY DEPARTMENT Provider Note   CSN: [id number] Arrival date & time: 11/30/22  0827     History  Chief Complaint  Patient presents with   Knee Pain    Betty Long is a 36 y.o. female.  89 old female with no significant past medical history presents with concern for pain to her posterior right knee which started about 5 days ago.  Pain is constant, does not radiate.  Feels that there is a degree of puffiness behind her knee and questions DVT versus Baker's cyst.  No personal or family history of PE or DVT, is a non-smoker, is not on CP, no recent extended travel.       Home Medications Prior to Admission medications   Medication Sig Start Date End Date Taking? Authorizing Provider  amoxicillin (AMOXIL) 400 MG/5ML suspension Take 2.40ms by mouth 2 times daily for 10 days 03/26/21   DHarrie Jeans MD  calcium carbonate (TUMS - DOSED IN MG ELEMENTAL CALCIUM) 500 MG chewable tablet Chew 1 tablet by mouth 2 (two) times daily as needed for indigestion or heartburn.    [provider]  FLUoxetine (PROZAC) 10 MG capsule Take 1 capsule by mouth daily. 06/10/22     ibuprofen (ADVIL,MOTRIN) 600 MG tablet Take 1 tablet (600 mg total) by mouth every 6 (six) hours as needed for moderate pain or cramping. 10/24/17   Banga, CLorna FewWorema, DO  lidocaine (LIDODERM) 5 % Place 1 patch onto the skin daily. Remove & Discard patch within 12 hours or as directed by MD 03/09/22   MDeliah Boston PA-C  metroNIDAZOLE (FLAGYL) 500 MG tablet Take 1 tablet (500 mg total) by mouth 2 (two) times daily for 7 days (no alcohol) 07/01/22     omega-3 acid ethyl esters (LOVAZA) 1 G capsule Take 2 g by mouth daily.    [provider]  pantoprazole (PROTONIX) 40 MG tablet Take 40 mg by mouth daily.    [provider]  Prenatal Vit-Fe Fumarate-FA (PRENATAL MULTIVITAMIN) TABS tablet Take 1 tablet by mouth daily at 12 noon.    [provider]   triamcinolone ointment (KENALOG) 0.1 % APPLY A THIN LAYER TO THE AFFECTED AREA(S) OF SKIN 1-2 TIMES PER DAY AS NEEDED 07/01/22         Allergies    Patient has no known allergies.    Review of Systems   Review of Systems Negative except as per HPI Physical Exam Updated Vital Signs BP 132/86   Pulse 72   Temp 98 F (36.7 C)   Resp 16   Ht '5\' 8"'$  (1.727 m)   Wt 59 kg   LMP 11/09/2022 (Approximate)   SpO2 100%   BMI 19.77 kg/m  Physical Exam Vitals and nursing note reviewed.  Constitutional:      General: She is not in acute distress.    Appearance: She is well-developed. She is not diaphoretic.  HENT:     Head: Normocephalic and atraumatic.  Cardiovascular:     Pulses: Normal pulses.  Pulmonary:     Effort: Pulmonary effort is normal.  Musculoskeletal:        General: No swelling, tenderness, deformity or signs of injury. Normal range of motion.     Right lower leg: No edema.     Left lower leg: No edema.     Comments: No lower extremity swelling, no palpable cords, no erythema  Skin:    General: Skin is warm and dry.  Findings: No erythema or rash.  Neurological:     Mental Status: She is alert and oriented to person, place, and time.     Sensory: No sensory deficit.     Motor: No weakness.  Psychiatric:        Behavior: Behavior normal.    Negative except as per HPI ED Results / Procedures / Treatments   Labs (all labs ordered are listed, but only abnormal results are displayed) Labs Reviewed  BASIC METABOLIC PANEL - Abnormal; Notable for the following components:      Result Value   Sodium 134 (*)    All other components within normal limits  CBC WITH DIFFERENTIAL/PLATELET  HCG, QUANTITATIVE, PREGNANCY  D-DIMER, QUANTITATIVE    EKG None  Radiology VAS [place] LOWER EXTREMITY VENOUS (DVT) (7a-7p)  Result Date: 11/30/2022  Lower Venous DVT Study Patient Name:  Betty Long  Date of Exam:   11/30/2022 Medical Rec #: [id number]                Accession #:    [id number] Date of Birth: 09-18-1986               Patient Gender: F Patient Age:   66 years Exam Location:  [facility] Procedure:      VAS [place] LOWER EXTREMITY VENOUS (DVT) Referring Phys: Domenic Moras --------------------------------------------------------------------------------  Indications: Swelling, and Edema.  Comparison Study: no prior Performing Technologist: Archie Patten RVS  Examination Guidelines: A complete evaluation includes B-mode imaging, spectral Doppler, color Doppler, and power Doppler as needed of all accessible portions of each vessel. Bilateral testing is considered an integral part of a complete examination. Limited examinations for reoccurring indications may be performed as noted. The reflux portion of the exam is performed with the patient in reverse Trendelenburg.  +---------+---------------+---------+-----------+----------+--------------+ RIGHT    CompressibilityPhasicitySpontaneityPropertiesThrombus Aging +---------+---------------+---------+-----------+----------+--------------+ CFV      Full           Yes      Yes                                 +---------+---------------+---------+-----------+----------+--------------+ SFJ      Full                                                        +---------+---------------+---------+-----------+----------+--------------+ FV Prox  Full                                                        +---------+---------------+---------+-----------+----------+--------------+ FV Mid   Full                                                        +---------+---------------+---------+-----------+----------+--------------+ FV DistalFull                                                        +---------+---------------+---------+-----------+----------+--------------+  PFV      Full                                                         +---------+---------------+---------+-----------+----------+--------------+ POP      Full           Yes      Yes                                 +---------+---------------+---------+-----------+----------+--------------+ PTV      Full                                                        +---------+---------------+---------+-----------+----------+--------------+ PERO     Full                                                        +---------+---------------+---------+-----------+----------+--------------+   +----+---------------+---------+-----------+----------+--------------+ LEFTCompressibilityPhasicitySpontaneityPropertiesThrombus Aging +----+---------------+---------+-----------+----------+--------------+ CFV Full           Yes      Yes                                 +----+---------------+---------+-----------+----------+--------------+     Summary: RIGHT: - There is no evidence of deep vein thrombosis in the lower extremity.  - No cystic structure found in the popliteal fossa.  LEFT: - No evidence of common femoral vein obstruction.  *See table(s) above for measurements and observations.    Preliminary     Procedures Procedures    Medications Ordered in ED Medications - No data to display  ED Course/ Medical Decision Making/ A&P                           Medical Decision Making  36 year old female with concern for pain to the posterior right knee for the past 5 days.  Pain is constant, without swelling, redness, palpable cords.  Sensation intact, DP pulse present.  No reports of injury.  Low risk for DVT.  Labs obtained in triage include CBC, BMP, hCG and D-dimer, all negative/within normal limits.  DVT study in the right lower extremity is negative for DVT or cystic structure.  Plan is to trial 10-day course of NSAIDs.  Follow-up with orthopedics or PCP if not improving.        Final Clinical Impression(s) / ED Diagnoses Final diagnoses:  Acute pain  of right knee    Rx / DC Orders ED Discharge Orders     None         Tacy Learn, PA-C 11/30/22 1256    Valarie Merino, MD 12/01/22 [phone]

## 2022-11-30 NOTE — ED Notes (Signed)
[place] tech called for pt x3 two separate occasions and pt did not answer.

## 2022-11-30 NOTE — Progress Notes (Signed)
Lower extremity venous has been completed.   Preliminary results in CV Proc.   Betty Long 11/30/2022 12:19 PM

## 2022-11-30 NOTE — ED Triage Notes (Signed)
Pt reports pain behind the right knee for the last 5 days. Denies recent fever, injuries. VSS, NAD at present.

## 2022-11-30 NOTE — Discharge Instructions (Signed)
Motrin/Tylenol as needed as directed. Recheck with your PCP if pain continues.

## 2022-11-30 NOTE — ED Provider Triage Note (Signed)
Emergency Medicine Provider Triage Evaluation Note  Betty Long JQBHALP , a 36 y.o. female  was evaluated in triage.  Pt complains of knee pain. Atraumatic pain to posterior knee and upper calf for nearly a week. Worse with standing.  No cp, sob, no fever, or chills.  No risk factors for DVT/PE  Review of Systems  Positive: As above Negative: As above  Physical Exam  BP 133/82   Pulse (!) 111   Temp 98.5 F (36.9 C) (Oral)   Resp 15   LMP 11/09/2022 (Approximate)   SpO2 98%  Gen:   Awake, no distress   Resp:  Normal effort  MSK:   Moves extremities without difficulty  Other:    Medical Decision Making  Medically screening exam initiated at 8:44 AM.  Appropriate orders placed.  Amisha Pospisil FXTKWIO was informed that the remainder of the evaluation will be completed by another provider, this initial triage assessment does not replace that evaluation, and the importance of remaining in the ED until their evaluation is complete.     Domenic Moras, PA-C 11/30/22 [phone]

## 2023-04-09 ENCOUNTER — Encounter: Payer: Self-pay | Admitting: Family

## 2023-04-09 ENCOUNTER — Ambulatory Visit: Payer: 59 | Admitting: Family

## 2023-04-09 ENCOUNTER — Other Ambulatory Visit (INDEPENDENT_AMBULATORY_CARE_PROVIDER_SITE_OTHER): Payer: 59

## 2023-04-09 VITALS — BP 106/62 | HR 78 | Ht 68.0 in | Wt 140.0 lb

## 2023-04-09 DIAGNOSIS — L989 Disorder of the skin and subcutaneous tissue, unspecified: Secondary | ICD-10-CM

## 2023-04-09 DIAGNOSIS — R5383 Other fatigue: Secondary | ICD-10-CM

## 2023-04-09 DIAGNOSIS — E559 Vitamin D deficiency, unspecified: Secondary | ICD-10-CM

## 2023-04-09 DIAGNOSIS — R252 Cramp and spasm: Secondary | ICD-10-CM | POA: Diagnosis not present

## 2023-04-09 DIAGNOSIS — R300 Dysuria: Secondary | ICD-10-CM

## 2023-04-09 DIAGNOSIS — Z803 Family history of malignant neoplasm of breast: Secondary | ICD-10-CM | POA: Diagnosis not present

## 2023-04-09 LAB — CBC WITH DIFFERENTIAL/PLATELET
Basophils Absolute: 0 10*3/uL (ref 0.0–0.1)
Basophils Relative: 0.4 % (ref 0.0–3.0)
Eosinophils Absolute: 0.2 10*3/uL (ref 0.0–0.7)
Eosinophils Relative: 2.3 % (ref 0.0–5.0)
HCT: 41.7 % (ref 36.0–46.0)
Hemoglobin: 14 g/dL (ref 12.0–15.0)
Lymphocytes Relative: 24.1 % (ref 12.0–46.0)
Lymphs Abs: 1.9 10*3/uL (ref 0.7–4.0)
MCHC: 33.7 g/dL (ref 30.0–36.0)
MCV: 89.7 fl (ref 78.0–100.0)
Monocytes Absolute: 0.7 10*3/uL (ref 0.1–1.0)
Monocytes Relative: 9.1 % (ref 3.0–12.0)
Neutro Abs: 4.9 10*3/uL (ref 1.4–7.7)
Neutrophils Relative %: 64.1 % (ref 43.0–77.0)
Platelets: 261 10*3/uL (ref 150.0–400.0)
RBC: 4.65 Mil/uL (ref 3.87–5.11)
RDW: 14.3 % (ref 11.5–15.5)
WBC: 7.7 10*3/uL (ref 4.0–10.5)

## 2023-04-09 LAB — MAGNESIUM: Magnesium: 2 mg/dL (ref 1.5–2.5)

## 2023-04-09 LAB — COMPREHENSIVE METABOLIC PANEL
ALT: 22 U/L (ref 0–35)
AST: 20 U/L (ref 0–37)
Albumin: 4.2 g/dL (ref 3.5–5.2)
Alkaline Phosphatase: 26 U/L — ABNORMAL LOW (ref 39–117)
BUN: 14 mg/dL (ref 6–23)
CO2: 24 mEq/L (ref 19–32)
Calcium: 9.1 mg/dL (ref 8.4–10.5)
Chloride: 104 mEq/L (ref 96–112)
Creatinine, Ser: 0.69 mg/dL (ref 0.40–1.20)
GFR: 111.5 mL/min (ref >60.00)
Glucose, Bld: 76 mg/dL (ref 70–99)
Potassium: 4.4 mEq/L (ref 3.5–5.1)
Sodium: 138 mEq/L (ref 135–145)
Total Bilirubin: 0.8 mg/dL (ref 0.2–1.2)
Total Protein: 6.5 g/dL (ref 6.0–8.3)

## 2023-04-09 LAB — VITAMIN D 25 HYDROXY (VIT D DEFICIENCY, FRACTURES): VITD: 30.29 ng/mL (ref 30.00–100.00)

## 2023-04-09 LAB — TSH: TSH: 1.35 u[IU]/mL (ref 0.35–5.50)

## 2023-04-09 LAB — VITAMIN B12: Vitamin B-12: 236 pg/mL (ref 211–911)

## 2023-04-09 NOTE — Progress Notes (Signed)
Betty Long is a 37 y.o. female with the following history as recorded in [employer]:  Patient Active Problem List   Diagnosis Date Noted   Genetic testing 07/07/2021   Family history of breast cancer 06/21/2021   Family history of melanoma 06/21/2021   Indication for care in labor or delivery 10/22/2017   Indication for care in labor and delivery, antepartum 01/26/2015   NSVD (normal spontaneous vaginal delivery) 01/26/2015   S/P knee surgery 02/09/2014   Contraception management 05/12/2012   ASCUS on Pap smear 05/12/2012    No current outpatient medications on file.   No current facility-administered medications for this visit.    Allergies: Patient has no known allergies.  Past Medical History:  Diagnosis Date   Family history of breast cancer    Family history of melanoma    History of abnormal cervical Pap smear    ASCUS  ---  NEGATIVE HPV   Mass of left knee    Vaginal Pap smear, abnormal     Past Surgical History:  Procedure Laterality Date   ANTERIOR CRUCIATE LIGAMENT REPAIR Left 2010   COSMETIC SURGERY     laceration repair   KNEE ARTHROSCOPY Left 02/09/2014   Procedure: LEFT KNEE EXCISIONAL BIOPSY;  Surgeon: Eugenia Mcalpine, MD;  Location: [facility];  Service: Orthopedics;  Laterality: Left;   KNEE ARTHROSCOPY W/ LATERAL RELEASE Left 09-16-2011   PATELLA MALTRACKING   MYRINGOTOMY     3mos 6 mos   TONSILLECTOMY  1993    Family History  Problem Relation Age of Onset   Anemia Mother    Hypertension Father    Melanoma Father 25       ear   Hypertension Brother    Melanoma Maternal Aunt    Breast cancer Maternal Grandmother 54   Melanoma Maternal Grandfather        dx 50s/60s, metastatic    Social History   Tobacco Use   Smoking status: Never   Smokeless tobacco: Never  Substance Use Topics   Alcohol use: No    Subjective:   Patient presents today as a new patient; needs to establish with new PCP; Recently was sick with  viral stomach illness and had concerning episode of numbness on bilateral upper extremities; wanted to make sure no other underlying concerns;  Overdue to see GYN;    Objective:  Vitals:   04/09/23 1026  BP: 106/62  Pulse: 78  SpO2: 99%  Weight: 140 lb (63.5 kg)  Height: 5\' 8"  (1.727 m)    General: Well developed, well nourished, in no acute distress  Skin : Warm and dry.  Head: Normocephalic and atraumatic  Eyes: Sclera and conjunctiva clear; pupils round and reactive to light; extraocular movements intact  Ears: External normal; canals clear; tympanic membranes normal  Oropharynx: Pink, supple. No suspicious lesions  Neck: Supple without thyromegaly, adenopathy  Lungs: Respirations unlabored; clear to auscultation bilaterally without wheeze, rales, rhonchi  CVS exam: normal rate and regular rhythm.  Abdomen: Soft; nontender; nondistended; normoactive bowel sounds; no masses or hepatosplenomegaly  Musculoskeletal: No deformities; no active joint inflammation  Extremities: No edema, cyanosis, clubbing  Vessels: Symmetric bilaterally  Neurologic: Alert and oriented; speech intact; face symmetrical; moves all extremities well; CNII-XII intact without focal deficit   Assessment:  1. Dysuria   2. FH: breast cancer   3. Skin lesion   4. Other fatigue   5. Muscle cramps   6. Vitamin D deficiency     Plan:  Update urine culture; Schedule for baseline mammogram; Referral to dermatology; Update labs today- concern for anemia; follow up to be determined;   No follow-ups on file.  Orders Placed This Encounter  Procedures   Urine Culture   MM Digital Screening    Standing Status:   Future    Standing Expiration Date:   04/08/2024    Order Specific Question:   Reason for Exam (SYMPTOM  OR DIAGNOSIS REQUIRED)    Answer:   FH of breast cancer    Order Specific Question:   Is the patient pregnant?    Answer:   No    Order Specific Question:   Preferred imaging location?    Answer:    [facility]   CBC with Differential/Platelet   Comp Met (CMET)   TSH   Iron, TIBC and Ferritin Panel   B12   Magnesium   Vitamin D (25 hydroxy)   Ambulatory referral to Dermatology    Referral Priority:   Routine    Referral Type:   Consultation    Referral Reason:   Specialty Services Required    Referred to Provider:   Bufford Buttner, MD    Requested Specialty:   Dermatology    Number of Visits Requested:   1    Requested Prescriptions    No prescriptions requested or ordered in this encounter

## 2023-04-11 LAB — URINE CULTURE
MICRO NUMBER:: 14934982
SPECIMEN QUALITY:: ADEQUATE

## 2023-04-13 ENCOUNTER — Other Ambulatory Visit: Payer: Self-pay | Admitting: Family

## 2023-04-14 ENCOUNTER — Other Ambulatory Visit: Payer: Self-pay | Admitting: Family

## 2023-04-14 ENCOUNTER — Other Ambulatory Visit (HOSPITAL_BASED_OUTPATIENT_CLINIC_OR_DEPARTMENT_OTHER): Payer: Self-pay

## 2023-04-14 MED ORDER — PENICILLIN V POTASSIUM 500 MG PO TABS
500.0000 mg | ORAL_TABLET | Freq: Three times a day (TID) | ORAL | 0 refills | Status: AC
  Filled 2023-04-14: qty 15, 5d supply, fill #0

## 2023-04-14 MED ORDER — VITAMIN D (ERGOCALCIFEROL) 1.25 MG (50000 UNIT) PO CAPS
50000.0000 [IU] | ORAL_CAPSULE | ORAL | 0 refills | Status: AC
  Filled 2023-04-14: qty 12, 84d supply, fill #0

## 2023-04-14 NOTE — Progress Notes (Signed)
Spoke with patient regarding results- she is aware;  Rx for Vitamin D and treatment for UTI sent; she is aware that may need to repeat iron studies- will let her know.

## 2023-04-29 DIAGNOSIS — D225 Melanocytic nevi of trunk: Secondary | ICD-10-CM | POA: Diagnosis not present

## 2023-04-29 DIAGNOSIS — D2261 Melanocytic nevi of right upper limb, including shoulder: Secondary | ICD-10-CM | POA: Diagnosis not present

## 2023-04-29 DIAGNOSIS — R208 Other disturbances of skin sensation: Secondary | ICD-10-CM | POA: Diagnosis not present

## 2023-04-29 DIAGNOSIS — D2262 Melanocytic nevi of left upper limb, including shoulder: Secondary | ICD-10-CM | POA: Diagnosis not present

## 2023-04-29 DIAGNOSIS — L821 Other seborrheic keratosis: Secondary | ICD-10-CM | POA: Diagnosis not present

## 2023-04-29 DIAGNOSIS — L814 Other melanin hyperpigmentation: Secondary | ICD-10-CM | POA: Diagnosis not present

## 2023-04-29 DIAGNOSIS — L819 Disorder of pigmentation, unspecified: Secondary | ICD-10-CM | POA: Diagnosis not present

## 2023-04-29 DIAGNOSIS — D2271 Melanocytic nevi of right lower limb, including hip: Secondary | ICD-10-CM | POA: Diagnosis not present

## 2023-04-30 DIAGNOSIS — H52223 Regular astigmatism, bilateral: Secondary | ICD-10-CM | POA: Diagnosis not present

## 2023-04-30 DIAGNOSIS — H5203 Hypermetropia, bilateral: Secondary | ICD-10-CM | POA: Diagnosis not present

## 2023-05-04 ENCOUNTER — Ambulatory Visit (HOSPITAL_BASED_OUTPATIENT_CLINIC_OR_DEPARTMENT_OTHER)
Admission: RE | Admit: 2023-05-04 | Discharge: 2023-05-04 | Disposition: A | Discharge status: Home / Self Care | Payer: 59 | Source: Ambulatory Visit | Attending: Family | Admitting: Family

## 2023-05-04 ENCOUNTER — Encounter (HOSPITAL_BASED_OUTPATIENT_CLINIC_OR_DEPARTMENT_OTHER): Payer: Self-pay

## 2023-05-04 DIAGNOSIS — Z1231 Encounter for screening mammogram for malignant neoplasm of breast: Secondary | ICD-10-CM | POA: Diagnosis not present

## 2023-05-04 DIAGNOSIS — N6489 Other specified disorders of breast: Secondary | ICD-10-CM | POA: Insufficient documentation

## 2023-05-04 DIAGNOSIS — Z803 Family history of malignant neoplasm of breast: Secondary | ICD-10-CM | POA: Insufficient documentation

## 2023-05-06 ENCOUNTER — Other Ambulatory Visit: Payer: Self-pay | Admitting: Family

## 2023-05-06 DIAGNOSIS — R928 Other abnormal and inconclusive findings on diagnostic imaging of breast: Secondary | ICD-10-CM

## 2023-05-22 ENCOUNTER — Ambulatory Visit
Admission: RE | Admit: 2023-05-22 | Discharge: 2023-05-22 | Disposition: A | Discharge status: Home / Self Care | Payer: 59 | Source: Ambulatory Visit | Attending: Family | Admitting: Family

## 2023-05-22 DIAGNOSIS — R928 Other abnormal and inconclusive findings on diagnostic imaging of breast: Secondary | ICD-10-CM

## 2023-05-22 DIAGNOSIS — N6310 Unspecified lump in the right breast, unspecified quadrant: Secondary | ICD-10-CM | POA: Diagnosis not present

## 2023-07-30 ENCOUNTER — Other Ambulatory Visit (HOSPITAL_COMMUNITY): Payer: Self-pay

## 2023-10-02 ENCOUNTER — Other Ambulatory Visit (HOSPITAL_COMMUNITY): Payer: Self-pay

## 2023-10-02 ENCOUNTER — Telehealth: Payer: Self-pay | Admitting: Family

## 2023-10-02 ENCOUNTER — Other Ambulatory Visit (HOSPITAL_BASED_OUTPATIENT_CLINIC_OR_DEPARTMENT_OTHER): Payer: Self-pay

## 2023-10-02 MED ORDER — AZITHROMYCIN 250 MG PO TABS
ORAL_TABLET | ORAL | 0 refills | Status: DC
  Filled 2023-10-02: qty 6, 5d supply, fill #0

## 2023-10-02 NOTE — Telephone Encounter (Signed)
Patient sen as Patent attorney; discussed concerned for worsening bronchitis and possible walking pneumonia; will go ahead and treat with Z-pak; needs to be seen in office if symptoms persist.

## 2023-10-05 ENCOUNTER — Other Ambulatory Visit: Payer: Self-pay | Admitting: Family

## 2023-10-05 MED ORDER — DOXYCYCLINE HYCLATE 100 MG PO TABS
100.0000 mg | ORAL_TABLET | Freq: Two times a day (BID) | ORAL | 0 refills | Status: DC
  Filled 2023-10-05: qty 20, 10d supply, fill #0

## 2023-10-05 MED ORDER — PREDNISONE 20 MG PO TABS
ORAL_TABLET | ORAL | 0 refills | Status: DC
  Filled 2023-10-05: qty 9, 7d supply, fill #0

## 2023-10-06 ENCOUNTER — Other Ambulatory Visit (HOSPITAL_BASED_OUTPATIENT_CLINIC_OR_DEPARTMENT_OTHER): Payer: Self-pay

## 2023-10-06 ENCOUNTER — Other Ambulatory Visit: Payer: Self-pay

## 2023-11-17 DIAGNOSIS — M25561 Pain in right knee: Secondary | ICD-10-CM | POA: Diagnosis not present

## 2023-11-19 DIAGNOSIS — M25561 Pain in right knee: Secondary | ICD-10-CM | POA: Diagnosis not present

## 2023-11-24 DIAGNOSIS — M25561 Pain in right knee: Secondary | ICD-10-CM | POA: Diagnosis not present

## 2023-12-16 ENCOUNTER — Encounter (HOSPITAL_BASED_OUTPATIENT_CLINIC_OR_DEPARTMENT_OTHER): Payer: Self-pay | Admitting: Orthopaedic Surgery

## 2023-12-16 ENCOUNTER — Other Ambulatory Visit: Payer: Self-pay

## 2023-12-21 ENCOUNTER — Encounter (HOSPITAL_BASED_OUTPATIENT_CLINIC_OR_DEPARTMENT_OTHER): Payer: Self-pay | Admitting: Orthopaedic Surgery

## 2023-12-23 NOTE — Discharge Instructions (Signed)
Ramond Marrow MD, MPH Alfonse Alpers, PA-C [facility] [address] [phone] (tel)   [phone] (fax)   POST-OPERATIVE INSTRUCTIONS - ACL RECONSTRUCTION  WOUND CARE You may remove the Operative Dressing on Post-Op Day #3 (72hrs after surgery).   Leave steri strips in place.   If you feel more comfortable with it you can leave all dressings in place till your 1 week follow-up appointment.   KEEP THE INCISIONS CLEAN AND DRY. An ACE wrap may be used to control swelling, do not wrap this too tight.  If the initial ACE wrap feels too tight or constricting you may loosen it. There may be a small amount of fluid/bleeding leaking at the surgical site.  This is normal; the knee is filled with fluid during the procedure and can leak for 24-48hrs after surgery.  You may change/reinforce the bandage as needed.  Use the Cryocuff, GameReady or Ice as often as possible for the first 3-4 days, then as needed for pain relief. Always keep a towel, ACE wrap or other barrier between the cooling unit and your skin.  You may shower on Post-Op Day #3. Gently pat the area dry.  Do not soak the knee in water.  Do not go swimming in the pool or ocean until 4 weeks after surgery or when otherwise instructed.  BRACE/AMBULATION Your leg will be placed in a brace post-operatively.  You may remove for hygiene only! You will need to wear your brace at all times until we discuss it further.  It should be locked in full extension (0 degrees) if adjustable.   You will be instructed on further bracing after your first visit. Use crutches for comfort but you can put your full weight on the leg as tolerated.  PHYSICAL THERAPY - You will begin physical therapy soon after surgery (unless otherwise specified) - Please call to set up an appointment, if you do not already have one  - Let our office if there are any issues with scheduling your therapy   - A PT referral was sent to [facility]   REGIONAL ANESTHESIA (NERVE BLOCKS) The anesthesia team may have performed a nerve block for you this is a great tool used to minimize pain.   The block may start wearing off overnight (between 8-24 hours postop) When the block wears off, your pain may go from nearly zero to the pain you would have had postop without the block. This is an abrupt transition but nothing dangerous is happening.   This can be a challenging period but utilize your as needed pain medications to try and manage this period. We suggest you use the pain medication the first night prior to going to bed, to ease this transition.  You may take an extra dose of narcotic when this happens if needed  POST-OP MEDICATIONS- Multimodal approach to pain control In general your pain will be controlled with a combination of substances.  Prescriptions unless otherwise discussed are electronically sent to your pharmacy.  This is a carefully made plan we use to minimize narcotic use.     Celebrex - Anti-inflammatory medication taken on a scheduled basis Acetaminophen - Non-narcotic pain medicine taken on a scheduled basis  Oxycodone - This is a strong narcotic, to be used only on an "as needed" basis for SEVERE pain. You may take benadryl as needed for any itching Aspirin 81mg  - This medicine is used to minimize the risk of blood clots after surgery.  Zofran - take as needed for nausea   FOLLOW-UP Please call the office to schedule a follow-up appointment for your incision check if you do not already have one, 7-10 days post-operatively. IF YOU HAVE ANY QUESTIONS, PLEASE FEEL FREE TO CALL OUR OFFICE.  HELPFUL INFORMATION   Keep your leg elevated to decrease swelling, which will then in turn decrease your pain. I would elevate the foot of your bed by putting a couple of couch pillows between your mattress and box spring. I would not keep pillow directly under your ankle.  You must wear the brace locked while  sleeping and ambulating until follow-up.   There will be MORE swelling on days 1-3 than there is on the day of surgery.  This also is normal. The swelling will decrease with the anti-inflammatory medication, ice and keeping it elevated. The swelling will make it more difficult to bend your knee. As the swelling goes down your motion will become easier  You may develop swelling and bruising that extends from your knee down to your calf and perhaps even to your foot over the next week. Do not be alarmed. This too is normal, and it is due to gravity  There may be some numbness adjacent to the incision site. This may last for 6-12 months or longer in some patients and is expected.  You may return to sedentary work/school in the next couple of days when you feel up to it. You will need to keep your leg elevated as much as possible   You should wean off your narcotic medicines as soon as you are able.  Most patients will be off narcotics before their first postop appointment.   We suggest you use the pain medication the first night prior to going to bed, in order to ease any pain when the anesthesia wears off. You should avoid taking pain medications on an empty stomach as it will make you nauseous.  Do not drink alcoholic beverages or take illicit drugs when taking pain medications.  It is against the law to drive while taking narcotics. You cannot drive if your Right leg is in brace locked in extension.  Pain medication may make you constipated.  Below are a few solutions to try in this order: Decrease the amount of pain medication if you aren't having pain. Drink lots of decaffeinated fluids. Drink prune juice and/or each dried prunes  If the first 3 don't work start with additional solutions Take Colace - an over-the-counter stool softener Take Senokot - an over-the-counter laxative Take Miralax - a stronger over-the-counter laxative  For more information including helpful videos and  documents visit our website:   https://[email or web address].com/patient-information.[email or web address]   No Tylenol until after 2pm today if needed No ibuprofen or Celebrex until after 5pm today if needed  Post Anesthesia Home Care Instructions  Activity: Get plenty of rest for the remainder of the day. A responsible individual must stay with you for 24 hours following the procedure.  For the next 24 hours, DO NOT: -Drive a car -Advertising copywriter -Drink alcoholic beverages -Take any medication unless instructed by your physician -Make any legal decisions or sign important papers.  Meals: Start with liquid foods such as gelatin or soup. Progress to regular foods as tolerated. Avoid greasy, spicy, heavy foods. If nausea and/or vomiting occur, drink only clear liquids until the nausea and/or vomiting subsides. Call your physician if vomiting continues.  Special Instructions/Symptoms: Your throat may feel dry or sore from the anesthesia or the  breathing tube placed in your throat during surgery. If this causes discomfort, gargle with warm salt water. The discomfort should disappear within 24 hours.  If you had a scopolamine patch placed behind your ear for the management of post- operative nausea and/or vomiting:  1. The medication in the patch is effective for 72 hours, after which it should be removed.  Wrap patch in a tissue and discard in the trash. Wash hands thoroughly with soap and water. 2. You may remove the patch earlier than 72 hours if you experience unpleasant side effects which may include dry mouth, dizziness or visual disturbances. 3. Avoid touching the patch. Wash your hands with soap and water after contact with the patch.    Regional Anesthesia Blocks  1. You may not be able to move or feel the "blocked" extremity after a regional anesthetic block. This may last may last from 3-48 hours after placement, but it will go away. The length of time depends on the medication injected and your  individual response to the medication. As the nerves start to wake up, you may experience tingling as the movement and feeling returns to your extremity. If the numbness and inability to move your extremity has not gone away after 48 hours, please call your surgeon.   2. The extremity that is blocked will need to be protected until the numbness is gone and the strength has returned. Because you cannot feel it, you will need to take extra care to avoid injury. Because it may be weak, you may have difficulty moving it or using it. You may not know what position it is in without looking at it while the block is in effect.  3. For blocks in the legs and feet, returning to weight bearing and walking needs to be done carefully. You will need to wait until the numbness is entirely gone and the strength has returned. You should be able to move your leg and foot normally before you try and bear weight or walk. You will need someone to be with you when you first try to ensure you do not fall and possibly risk injury.  4. Bruising and tenderness at the needle site are common side effects and will resolve in a few days.  5. Persistent numbness or new problems with movement should be communicated to the surgeon or the [facility] [phone] [facility] [phone]).

## 2023-12-23 NOTE — H&P (Signed)
PREOPERATIVE H&P  Chief Complaint: RIGHT KNEE MEDIAL MENISCUS TEAR, ACL TEAR  HPI: Betty Long is a 38 y.o. female who is scheduled for, Procedure(s): KNEE ARTHROSCOPY WITH MEDIAL MENISECTOMY KNEE ARTHROSCOPY WITH ANTERIOR CRUCIATE LIGAMENT (ACL) RECONSTRUCTION WITH TIBIAL ANTERIOR ALLOGRAFT.   Betty Long is a healthy 38 year old female here for evaluation of right knee injury that she sustained on 11/14/2023.  She injured this while playing basketball when she planted and twisted on the right knee and felt a pop.  She has had consistent anteromedial knee pain since the time of her injury with some associated knee swelling.  She has been managing her pain with Tylenol and ibuprofen.  She has a history of previous left ACL tear in college.  Regarding her right knee, currently she has a sense of instability, though denies catching or locking sensation.  She has been ambulatory with crutches assistance.  She has had this evaluated by Dr. Pearletha Forge at home and there is concern for meniscus plus/minus ACL involvement as well.    Symptoms are rated as moderate to severe, and have been worsening.  This is significantly impairing activities of daily living.    Please see clinic note for further details on this patient's care.    She has elected for surgical management.   Past Medical History:  Diagnosis Date   Family history of breast cancer    Family history of melanoma    History of abnormal cervical Pap smear    ASCUS  ---  NEGATIVE HPV   Mass of left knee    Vaginal Pap smear, abnormal    Past Surgical History:  Procedure Laterality Date   ANTERIOR CRUCIATE LIGAMENT REPAIR Left 2010   COSMETIC SURGERY     laceration repair   KNEE ARTHROSCOPY Left 02/09/2014   Procedure: LEFT KNEE EXCISIONAL BIOPSY;  Surgeon: Eugenia Mcalpine, MD;  Location: [facility];  Service: Orthopedics;  Laterality: Left;   KNEE ARTHROSCOPY W/ LATERAL RELEASE Left 09-16-2011   PATELLA  MALTRACKING   MYRINGOTOMY     3mos 6 mos   TONSILLECTOMY  1993   Social History   Socioeconomic History   Marital status: Married    Spouse name: Reuel Boom   Number of children: Not on file   Years of education: Not on file   Highest education level: Not on file  Occupational History   Not on file  Tobacco Use   Smoking status: Never   Smokeless tobacco: Never  Vaping Use   Vaping status: Never Used  Substance and Sexual Activity   Alcohol use: No   Drug use: No   Sexual activity: Not on file  Other Topics Concern   Not on file  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[employer]   Social Drivers of Health   Financial Resource Strain: Not on file  Food Insecurity: Not on file  Transportation Needs: Not on file  Physical Activity: Not on file  Stress: Not on file  Social Connections: Unknown (04/15/2022)   [facility]   Social Network    Social Network: Not on file   Family History  Problem Relation Age of Onset   Anemia Mother    Hypertension Father    Melanoma Father 55       ear   Hypertension Brother    Melanoma Maternal Aunt    Breast cancer Maternal Grandmother 49   Melanoma Maternal Grandfather        dx  50s/60s, metastatic   Allergies  Allergen Reactions   Percocet [Oxycodone-Acetaminophen] Itching   Prior to Admission medications   Not on File    ROS: All other systems have been reviewed and were otherwise negative with the exception of those mentioned in the HPI and as above.  Physical Exam: General: Alert, no acute distress Cardiovascular: No pedal edema Respiratory: No cyanosis, no use of accessory musculature GI: No organomegaly, abdomen is soft and non-tender Skin: No lesions in the area of chief complaint Neurologic: Sensation intact distally Psychiatric: Patient is competent for consent with normal mood and affect Lymphatic: No axillary or cervical lymphadenopathy  MUSCULOSKELETAL:  The range of motion of  the knee is full. Grossly unstable Lachman.   Imaging: MRI demonstrates medial and lateral meniscus tearing as well as a complete ACL tear  Assessment: RIGHT KNEE MEDIAL MENISCUS TEAR, ACL TEAR  Plan: Plan for Procedure(s): KNEE ARTHROSCOPY WITH MEDIAL MENISECTOMY KNEE ARTHROSCOPY WITH ANTERIOR CRUCIATE LIGAMENT (ACL) RECONSTRUCTION WITH TIBIAL ANTERIOR ALLOGRAFT  The risks benefits and alternatives were discussed with the patient including but not limited to the risks of nonoperative treatment, versus surgical intervention including infection, bleeding, nerve injury,  blood clots, cardiopulmonary complications, morbidity, mortality, among others, and they were willing to proceed.   The patient acknowledged the explanation, agreed to proceed with the plan and consent was signed.   Operative Plan: Right knee scope with ACL reconstruction with allograft and meniscectomy  Discharge Medications: standard DVT Prophylaxis: aspirin Physical Therapy: outpatient PT Special Discharge needs: Knee immobilizer. IceMan   Vernetta Honey, PA-C  12/23/2023 8:58 PM

## 2023-12-24 ENCOUNTER — Ambulatory Visit (HOSPITAL_BASED_OUTPATIENT_CLINIC_OR_DEPARTMENT_OTHER): Payer: 59 | Admitting: Anesthesiology

## 2023-12-24 ENCOUNTER — Encounter (HOSPITAL_BASED_OUTPATIENT_CLINIC_OR_DEPARTMENT_OTHER)
Admission: RE | Disposition: A | Discharge status: Home / Self Care | Payer: Self-pay | Source: Home / Self Care | Attending: Orthopaedic Surgery

## 2023-12-24 ENCOUNTER — Other Ambulatory Visit (HOSPITAL_COMMUNITY): Payer: Self-pay

## 2023-12-24 ENCOUNTER — Other Ambulatory Visit: Payer: Self-pay

## 2023-12-24 ENCOUNTER — Ambulatory Visit (HOSPITAL_BASED_OUTPATIENT_CLINIC_OR_DEPARTMENT_OTHER)
Admission: RE | Admit: 2023-12-24 | Discharge: 2023-12-24 | Disposition: A | Discharge status: Home / Self Care | Payer: 59 | Attending: Orthopaedic Surgery | Admitting: Orthopaedic Surgery

## 2023-12-24 ENCOUNTER — Encounter (HOSPITAL_BASED_OUTPATIENT_CLINIC_OR_DEPARTMENT_OTHER): Payer: Self-pay | Admitting: Orthopaedic Surgery

## 2023-12-24 ENCOUNTER — Ambulatory Visit (HOSPITAL_BASED_OUTPATIENT_CLINIC_OR_DEPARTMENT_OTHER): Payer: 59

## 2023-12-24 DIAGNOSIS — G8918 Other acute postprocedural pain: Secondary | ICD-10-CM | POA: Diagnosis not present

## 2023-12-24 DIAGNOSIS — S83221A Peripheral tear of medial meniscus, current injury, right knee, initial encounter: Secondary | ICD-10-CM | POA: Diagnosis not present

## 2023-12-24 DIAGNOSIS — Y9367 Activity, basketball: Secondary | ICD-10-CM | POA: Diagnosis not present

## 2023-12-24 DIAGNOSIS — Z01818 Encounter for other preprocedural examination: Secondary | ICD-10-CM

## 2023-12-24 DIAGNOSIS — S83241A Other tear of medial meniscus, current injury, right knee, initial encounter: Secondary | ICD-10-CM | POA: Diagnosis present

## 2023-12-24 DIAGNOSIS — S83511A Sprain of anterior cruciate ligament of right knee, initial encounter: Secondary | ICD-10-CM | POA: Insufficient documentation

## 2023-12-24 HISTORY — PX: KNEE ARTHROSCOPY WITH MEDIAL MENISECTOMY: SHX5651

## 2023-12-24 LAB — POCT PREGNANCY, URINE: Preg Test, Ur: NEGATIVE

## 2023-12-24 SURGERY — ARTHROSCOPY, KNEE, WITH MEDIAL MENISCECTOMY
Anesthesia: Regional | Site: Knee | Laterality: Right

## 2023-12-24 MED ORDER — LACTATED RINGERS IV SOLN: INTRAVENOUS | Status: DC

## 2023-12-24 MED ORDER — KETOROLAC TROMETHAMINE 30 MG/ML IJ SOLN
INTRAMUSCULAR | Status: AC
  Filled 2023-12-24: qty 1

## 2023-12-24 MED ORDER — HYDROMORPHONE HCL 1 MG/ML IJ SOLN
INTRAMUSCULAR | Status: AC
  Filled 2023-12-24: qty 0.5

## 2023-12-24 MED ORDER — BUPIVACAINE-EPINEPHRINE (PF) 0.5% -1:200000 IJ SOLN
INTRAMUSCULAR | Status: DC | PRN
  Administered 2023-12-24: 30 mL via PERINEURAL

## 2023-12-24 MED ORDER — FENTANYL CITRATE (PF) 100 MCG/2ML IJ SOLN
100.0000 ug | Freq: Once | INTRAMUSCULAR | Status: AC
  Administered 2023-12-24: 100 ug via INTRAVENOUS

## 2023-12-24 MED ORDER — HYDROMORPHONE HCL 1 MG/ML IJ SOLN
0.5000 mg | INTRAMUSCULAR | Status: DC | PRN
  Administered 2023-12-24 (×3): 0.5 mg via INTRAVENOUS

## 2023-12-24 MED ORDER — LIDOCAINE 2% (20 MG/ML) 5 ML SYRINGE
INTRAMUSCULAR | Status: AC
  Filled 2023-12-24: qty 5

## 2023-12-24 MED ORDER — MIDAZOLAM HCL 2 MG/2ML IJ SOLN
INTRAMUSCULAR | Status: AC
  Filled 2023-12-24: qty 2

## 2023-12-24 MED ORDER — ONDANSETRON HCL 4 MG/2ML IJ SOLN
INTRAMUSCULAR | Status: AC
  Filled 2023-12-24: qty 2

## 2023-12-24 MED ORDER — FAMOTIDINE IN NACL 20-0.9 MG/50ML-% IV SOLN
20.0000 mg | Freq: Once | INTRAVENOUS | Status: AC
  Administered 2023-12-24: 20 mg via INTRAVENOUS

## 2023-12-24 MED ORDER — VANCOMYCIN HCL 1000 MG IV SOLR
INTRAVENOUS | Status: DC | PRN
  Administered 2023-12-24: 1000 mg via TOPICAL

## 2023-12-24 MED ORDER — FENTANYL CITRATE (PF) 100 MCG/2ML IJ SOLN
25.0000 ug | INTRAMUSCULAR | Status: DC | PRN
  Administered 2023-12-24: 25 ug via INTRAVENOUS
  Administered 2023-12-24: 50 ug via INTRAVENOUS
  Administered 2023-12-24: 25 ug via INTRAVENOUS
  Administered 2023-12-24: 50 ug via INTRAVENOUS

## 2023-12-24 MED ORDER — ONDANSETRON 4 MG PO TBDP
ORAL_TABLET | ORAL | Status: AC
  Filled 2023-12-24: qty 1

## 2023-12-24 MED ORDER — CELECOXIB 100 MG PO CAPS
100.0000 mg | ORAL_CAPSULE | Freq: Two times a day (BID) | ORAL | 0 refills | Status: AC
Start: 2023-12-24 — End: 2024-01-23
  Filled 2023-12-24: qty 60, 30d supply, fill #0

## 2023-12-24 MED ORDER — EPHEDRINE SULFATE (PRESSORS) 50 MG/ML IJ SOLN
INTRAMUSCULAR | Status: DC | PRN
  Administered 2023-12-24: 10 mg via INTRAVENOUS

## 2023-12-24 MED ORDER — PROPOFOL 10 MG/ML IV BOLUS
INTRAVENOUS | Status: AC
  Filled 2023-12-24: qty 20

## 2023-12-24 MED ORDER — CEFAZOLIN SODIUM-DEXTROSE 2-4 GM/100ML-% IV SOLN
2.0000 g | INTRAVENOUS | Status: AC
  Administered 2023-12-24: 2 g via INTRAVENOUS

## 2023-12-24 MED ORDER — FENTANYL CITRATE (PF) 100 MCG/2ML IJ SOLN
INTRAMUSCULAR | Status: AC
  Filled 2023-12-24: qty 2

## 2023-12-24 MED ORDER — LIDOCAINE 2% (20 MG/ML) 5 ML SYRINGE: INTRAMUSCULAR | Status: DC | PRN

## 2023-12-24 MED ORDER — SODIUM CHLORIDE 0.9 % IV SOLN: INTRAVENOUS | Status: DC | PRN

## 2023-12-24 MED ORDER — PROPOFOL 10 MG/ML IV BOLUS
INTRAVENOUS | Status: DC | PRN
  Administered 2023-12-24: 150 mg via INTRAVENOUS

## 2023-12-24 MED ORDER — ACETAMINOPHEN 500 MG PO TABS
1000.0000 mg | ORAL_TABLET | Freq: Three times a day (TID) | ORAL | 0 refills | Status: DC
Start: 2023-12-24 — End: 2024-05-23
  Filled 2023-12-24: qty 100, 17d supply, fill #0

## 2023-12-24 MED ORDER — ACETAMINOPHEN 500 MG PO TABS
ORAL_TABLET | ORAL | Status: AC
  Filled 2023-12-24: qty 2

## 2023-12-24 MED ORDER — ONDANSETRON HCL 4 MG PO TABS
4.0000 mg | ORAL_TABLET | Freq: Three times a day (TID) | ORAL | 0 refills | Status: AC | PRN
  Filled 2023-12-24: qty 10, 4d supply, fill #0

## 2023-12-24 MED ORDER — KETOROLAC TROMETHAMINE 30 MG/ML IJ SOLN: 30.0000 mg | Freq: Once | INTRAMUSCULAR | Status: DC | PRN

## 2023-12-24 MED ORDER — DIPHENHYDRAMINE HCL 50 MG/ML IJ SOLN
INTRAMUSCULAR | Status: AC
  Filled 2023-12-24: qty 1

## 2023-12-24 MED ORDER — ASPIRIN 81 MG PO CHEW
81.0000 mg | CHEWABLE_TABLET | Freq: Two times a day (BID) | ORAL | 0 refills | Status: AC
Start: 2023-12-24 — End: 2024-02-07
  Filled 2023-12-24: qty 90, 45d supply, fill #0

## 2023-12-24 MED ORDER — CEFAZOLIN SODIUM-DEXTROSE 2-4 GM/100ML-% IV SOLN
INTRAVENOUS | Status: AC
  Filled 2023-12-24: qty 100

## 2023-12-24 MED ORDER — ONDANSETRON HCL 4 MG/2ML IJ SOLN
INTRAMUSCULAR | Status: DC | PRN
  Administered 2023-12-24: 4 mg via INTRAVENOUS

## 2023-12-24 MED ORDER — MIDAZOLAM HCL 2 MG/2ML IJ SOLN
2.0000 mg | Freq: Once | INTRAMUSCULAR | Status: AC
  Administered 2023-12-24: 2 mg via INTRAVENOUS

## 2023-12-24 MED ORDER — ONDANSETRON 4 MG PO TBDP
4.0000 mg | ORAL_TABLET | Freq: Once | ORAL | Status: AC
  Administered 2023-12-24: 4 mg via ORAL

## 2023-12-24 MED ORDER — SODIUM CHLORIDE 0.9 % IR SOLN
Status: DC | PRN
  Administered 2023-12-24: 4500 mL

## 2023-12-24 MED ORDER — LIDOCAINE 2% (20 MG/ML) 5 ML SYRINGE
INTRAMUSCULAR | Status: DC | PRN
  Administered 2023-12-24: 60 mg via INTRAVENOUS

## 2023-12-24 MED ORDER — FENTANYL CITRATE (PF) 100 MCG/2ML IJ SOLN
INTRAMUSCULAR | Status: DC | PRN
  Administered 2023-12-24 (×2): 25 ug via INTRAVENOUS
  Administered 2023-12-24: 50 ug via INTRAVENOUS

## 2023-12-24 MED ORDER — DIPHENHYDRAMINE HCL 50 MG/ML IJ SOLN
12.5000 mg | Freq: Once | INTRAMUSCULAR | Status: AC
  Administered 2023-12-24: 12.5 mg via INTRAVENOUS

## 2023-12-24 MED ORDER — EPHEDRINE 5 MG/ML INJ
INTRAVENOUS | Status: AC
  Filled 2023-12-24: qty 5

## 2023-12-24 MED ORDER — OXYCODONE HCL 5 MG PO TABS
5.0000 mg | ORAL_TABLET | Freq: Four times a day (QID) | ORAL | 0 refills | Status: AC | PRN
  Filled 2023-12-24: qty 30, 4d supply, fill #0

## 2023-12-24 MED ORDER — AMISULPRIDE (ANTIEMETIC) 5 MG/2ML IV SOLN
INTRAVENOUS | Status: AC
  Filled 2023-12-24: qty 2

## 2023-12-24 MED ORDER — ACETAMINOPHEN 500 MG PO TABS
1000.0000 mg | ORAL_TABLET | Freq: Once | ORAL | Status: AC
  Administered 2023-12-24: 1000 mg via ORAL

## 2023-12-24 MED ORDER — VANCOMYCIN HCL 1000 MG IV SOLR
INTRAVENOUS | Status: AC
  Filled 2023-12-24: qty 20

## 2023-12-24 MED ORDER — DEXAMETHASONE SODIUM PHOSPHATE 10 MG/ML IJ SOLN
INTRAMUSCULAR | Status: DC | PRN
  Administered 2023-12-24: 10 mg via INTRAVENOUS

## 2023-12-24 MED ORDER — FAMOTIDINE IN NACL 20-0.9 MG/50ML-% IV SOLN
INTRAVENOUS | Status: AC
  Filled 2023-12-24: qty 50

## 2023-12-24 MED ORDER — DEXMEDETOMIDINE HCL IN NACL 80 MCG/20ML IV SOLN
INTRAVENOUS | Status: AC
  Filled 2023-12-24: qty 20

## 2023-12-24 MED ORDER — GABAPENTIN 300 MG PO CAPS
ORAL_CAPSULE | ORAL | Status: AC
  Filled 2023-12-24: qty 1

## 2023-12-24 MED ORDER — DEXAMETHASONE SODIUM PHOSPHATE 10 MG/ML IJ SOLN
INTRAMUSCULAR | Status: AC
  Filled 2023-12-24: qty 1

## 2023-12-24 MED ORDER — DEXMEDETOMIDINE HCL IN NACL 80 MCG/20ML IV SOLN
INTRAVENOUS | Status: DC | PRN
  Administered 2023-12-24: 8 ug via INTRAVENOUS
  Administered 2023-12-24: 4 ug via INTRAVENOUS

## 2023-12-24 MED ORDER — AMISULPRIDE (ANTIEMETIC) 5 MG/2ML IV SOLN
10.0000 mg | Freq: Once | INTRAVENOUS | Status: AC | PRN
  Administered 2023-12-24: 10 mg via INTRAVENOUS

## 2023-12-24 MED ORDER — GABAPENTIN 300 MG PO CAPS
300.0000 mg | ORAL_CAPSULE | Freq: Once | ORAL | Status: AC
  Administered 2023-12-24: 300 mg via ORAL

## 2023-12-24 MED ORDER — KETOROLAC TROMETHAMINE 30 MG/ML IJ SOLN
INTRAMUSCULAR | Status: DC | PRN
  Administered 2023-12-24: 30 mg via INTRAVENOUS

## 2023-12-24 SURGICAL SUPPLY — 82 items
ANCHOR PEEK 4.75X19.1 SWLK C (Anchor) IMPLANT
ANCHOR TIGHTROPE II 20 W/IB (Anchor) IMPLANT
BLADE SHAVER BONE 5.0X13 (MISCELLANEOUS) ×1 IMPLANT
BLADE SURG 10 STRL SS (BLADE) ×1 IMPLANT
BLADE SURG 15 STRL LF DISP TIS (BLADE) ×1 IMPLANT
BNDG COHESIVE 4X5 TAN STRL LF (GAUZE/BANDAGES/DRESSINGS) IMPLANT
BNDG ELASTIC 6INX 5YD STR LF (GAUZE/BANDAGES/DRESSINGS) ×1 IMPLANT
BONE TUNNEL PLUG CANNULATED (MISCELLANEOUS) IMPLANT
BURR OVAL 8 FLU 4.0X13 (MISCELLANEOUS) IMPLANT
CHLORAPREP W/TINT 26 (MISCELLANEOUS) ×1 IMPLANT
CLSR STERI-STRIP ANTIMIC 1/2X4 (GAUZE/BANDAGES/DRESSINGS) IMPLANT
COLLECTOR GRAFT TISSUE (SYSTAGENIX WOUND MANAGEMENT)
COOLER ICEMAN CLASSIC (MISCELLANEOUS) ×1 IMPLANT
COVER BACK TABLE 60X90IN (DRAPES) ×1 IMPLANT
CUFF TRNQT CYL 34X4.125X (TOURNIQUET CUFF) IMPLANT
CUTTER KNOT PUSHER W/ SKID (INSTRUMENTS) IMPLANT
CUTTER TENSIONER SUT 2-0 0 FBW (INSTRUMENTS) IMPLANT
DISSECTOR 3.5MM X 13CM CVD (MISCELLANEOUS) IMPLANT
DISSECTOR 4.0MMX13CM CVD (MISCELLANEOUS) ×1 IMPLANT
DRAPE IMP U-DRAPE 54X76 (DRAPES) IMPLANT
DRAPE OEC MINIVIEW 54X84 (DRAPES) ×1 IMPLANT
DRAPE U-SHAPE 47X51 STRL (DRAPES) ×1 IMPLANT
DRAPE-T ARTHROSCOPY W/POUCH (DRAPES) ×1 IMPLANT
ELECT REM PT RETURN 9FT ADLT (ELECTROSURGICAL) ×1
ELECTRODE REM PT RTRN 9FT ADLT (ELECTROSURGICAL) ×1 IMPLANT
FIBERSTICK 2 (SUTURE) IMPLANT
GAUZE SPONGE 4X4 12PLY STRL (GAUZE/BANDAGES/DRESSINGS) ×2 IMPLANT
GLOVE BIO SURGEON STRL SZ 6.5 (GLOVE) ×1 IMPLANT
GLOVE BIOGEL PI IND STRL 6.5 (GLOVE) ×1 IMPLANT
GLOVE BIOGEL PI IND STRL 7.0 (GLOVE) IMPLANT
GLOVE BIOGEL PI IND STRL 8 (GLOVE) ×1 IMPLANT
GLOVE ECLIPSE 8.0 STRL XLNG CF (GLOVE) ×1 IMPLANT
GLOVE SURG SYN 8.0 (GLOVE) ×1
GLOVE SURG SYN 8.0 PF PI (GLOVE) IMPLANT
GOWN STRL REUS W/ TWL LRG LVL3 (GOWN DISPOSABLE) ×2 IMPLANT
GOWN STRL REUS W/TWL XL LVL3 (GOWN DISPOSABLE) ×1 IMPLANT
GRAFT TISS ANT TIB TNDN (Tissue) IMPLANT
IMMOBILIZER KNEE 20 (SOFTGOODS)
IMMOBILIZER KNEE 20 THIGH 36 (SOFTGOODS) IMPLANT
IMMOBILIZER KNEE 22 UNIV (SOFTGOODS) IMPLANT
IMMOBILIZER KNEE 24 THIGH 36 (MISCELLANEOUS) IMPLANT
IMMOBILIZER KNEE 24 UNIV (MISCELLANEOUS) ×1
IMPL FIBERSTITCH 1.5 CVD (Anchor) IMPLANT
IMPLANT FIBERSTITCH 1.5 CVD (Anchor) ×1 IMPLANT
IMPLANT TIGHTROPE FIBERTAG ACL (Orthopedic Implant) IMPLANT
IV NS IRRIG 3000ML ARTHROMATIC (IV SOLUTION) ×4 IMPLANT
KIT TRANSTIBIAL (DISPOSABLE) ×1 IMPLANT
NDL SAFETY ECLIPSE 18X1.5 (NEEDLE) ×1 IMPLANT
NDL SUT 2-0 SCORPION KNEE (NEEDLE) IMPLANT
NEEDLE SUT 2-0 SCORPION KNEE (NEEDLE)
NS IRRIG 1000ML POUR BTL (IV SOLUTION) ×1 IMPLANT
PACK ARTHROSCOPY DSU (CUSTOM PROCEDURE TRAY) ×1 IMPLANT
PACK BASIN DAY SURGERY FS (CUSTOM PROCEDURE TRAY) IMPLANT
PAD CAST 4YDX4 CTTN HI CHSV (CAST SUPPLIES) ×1 IMPLANT
PAD COLD SHLDR WRAP-ON (PAD) ×1 IMPLANT
PENCIL SMOKE EVACUATOR (MISCELLANEOUS) ×1 IMPLANT
PIN DRILL ACL TIGHTROPE 4MM (PIN) IMPLANT
PORTAL SKID DEVICE (INSTRUMENTS) IMPLANT
SCREW FT BIOCOMP 9X30 (Screw) IMPLANT
SHEET MEDIUM DRAPE 40X70 STRL (DRAPES) ×1 IMPLANT
SLEEVE SCD COMPRESS KNEE MED (STOCKING) ×1 IMPLANT
SPIKE FLUID TRANSFER (MISCELLANEOUS) IMPLANT
SPONGE T-LAP 18X18 ~~LOC~~+RFID (SPONGE) IMPLANT
SPONGE T-LAP 4X18 ~~LOC~~+RFID (SPONGE) ×1 IMPLANT
SUT 0 FIBERLOOP 38 BLUE TPR ND (SUTURE)
SUT 2 FIBERLOOP 20 STRT BLUE (SUTURE) ×2
SUT FIBERWIRE #2 38 T-5 BLUE (SUTURE) ×1
SUT MNCRL AB 4-0 PS2 18 (SUTURE) ×1 IMPLANT
SUT PDS AB 0 CT 36 (SUTURE) IMPLANT
SUT VIC AB 0 CT1 27XBRD ANBCTR (SUTURE) ×2 IMPLANT
SUT VIC AB 1 CT1 27XBRD ANBCTR (SUTURE) IMPLANT
SUT VIC AB 3-0 SH 27X BRD (SUTURE) ×1 IMPLANT
SUTURE 0 FIBERLP 38 BLU TPR ND (SUTURE) IMPLANT
SUTURE 2 FIBERLOOP 20 STRT BLU (SUTURE) IMPLANT
SUTURE FIBERWR #2 38 T-5 BLUE (SUTURE) IMPLANT
TENDON ANTERIOR TIBIALIS (Tissue) ×1 IMPLANT
TISSUE GRAFT COLLECTOR (SYSTAGENIX WOUND MANAGEMENT) IMPLANT
TOWEL GREEN STERILE FF (TOWEL DISPOSABLE) ×1 IMPLANT
TUBE CONNECTING 20X1/4 (TUBING) ×1 IMPLANT
TUBE SUCTION HIGH CAP CLEAR NV (SUCTIONS) ×1 IMPLANT
TUBING ARTHROSCOPY IRRIG 16FT (MISCELLANEOUS) ×1 IMPLANT
WAND ABLATOR APOLLO I90 (BUR) ×1 IMPLANT

## 2023-12-24 NOTE — Transfer of Care (Signed)
Immediate Anesthesia Transfer of Care Note  Patient: Betty Long [facility]  Procedure(s) Performed: KNEE ARTHROSCOPY WITH MEDIAL MENISECTOMY (Right: Knee) KNEE ARTHROSCOPY WITH ANTERIOR CRUCIATE LIGAMENT (ACL) RECONSTRUCTION WITH TIBIAL ANTERIOR ALLOGRAFT (Right: Knee)  Patient Location: PACU  Anesthesia Type:General  Level of Consciousness: drowsy  Airway & Oxygen Therapy: Patient Spontanous Breathing and Patient connected to face mask oxygen  Post-op Assessment: Report given to RN and Post -op Vital signs reviewed and stable  Post vital signs: Reviewed and stable  Last Vitals:  Vitals Value Taken Time  BP 111/61 12/24/23 1103  Temp 36.6 C 12/24/23 1103  Pulse 71 12/24/23 1105  Resp 17 12/24/23 1105  SpO2 99 % 12/24/23 1105  Vitals shown include unfiled device data.  Last Pain:  Vitals:   12/24/23 0804  TempSrc: Temporal  PainSc: 0-No pain         Complications: No notable events documented.

## 2023-12-24 NOTE — Anesthesia Postprocedure Evaluation (Signed)
Anesthesia Post Note  Patient: Betty Long [facility]  Procedure(s) Performed: KNEE ARTHROSCOPY WITH MEDIAL MENISECTOMY (Right: Knee) KNEE ARTHROSCOPY WITH ANTERIOR CRUCIATE LIGAMENT (ACL) RECONSTRUCTION WITH TIBIAL ANTERIOR ALLOGRAFT (Right: Knee)     Patient location during evaluation: PACU Anesthesia Type: Regional and General Level of consciousness: awake Pain management: pain level controlled Vital Signs Assessment: post-procedure vital signs reviewed and stable Respiratory status: spontaneous breathing, nonlabored ventilation and respiratory function stable Cardiovascular status: blood pressure returned to baseline and stable Postop Assessment: no apparent nausea or vomiting Anesthetic complications: no   No notable events documented.  Last Vitals:  Vitals:   12/24/23 1330 12/24/23 1435  BP: 111/64   Pulse: 67 80  Resp: 10 14  Temp:  (!) 36.2 C  SpO2: 100%     Last Pain:  Vitals:   12/24/23 1435  TempSrc:   PainSc: 4                  Valencia Kassa P Mckyle Solanki

## 2023-12-24 NOTE — Progress Notes (Signed)
AssistedDr. Ellender with right, adductor canal, ultrasound guided block. Side rails up, monitors on throughout procedure. See vital signs in flow sheet. Tolerated Procedure well.  

## 2023-12-24 NOTE — Anesthesia Procedure Notes (Signed)
Anesthesia Regional Block: Adductor canal block   Pre-Anesthetic Checklist: , timeout performed,  Correct Patient, Correct Site, Correct Laterality,  Correct Procedure,, site marked,  Risks and benefits discussed,  Surgical consent,  Pre-op evaluation,  At surgeon's request and post-op pain management  Laterality: Right  Prep: chloraprep       Needles:  Injection technique: Single-shot  Needle Type: Echogenic Stimulator Needle     Needle Length: 9cm  Needle Gauge: 21     Additional Needles:   Procedures:,,,, ultrasound used (permanent image in chart),,    Narrative:  Start time: 12/24/2023 9:10 AM End time: 12/24/2023 9:20 AM Injection made incrementally with aspirations every 5 mL.  Performed by: Personally  Anesthesiologist: Leonides Grills, MD  Additional Notes: Functioning IV was confirmed and monitors were applied. A time-out was performed. Hand hygiene and sterile gloves were used. The thigh was placed in a frog-leg position and prepped in a sterile fashion. A 90mm 21ga Arrow echogenic stimulator needle was placed using ultrasound guidance.  Negative aspiration and negative test dose prior to incremental administration of local anesthetic. The patient tolerated the procedure well.

## 2023-12-24 NOTE — Anesthesia Procedure Notes (Signed)
Procedure Name: LMA Insertion Date/Time: 12/24/2023 9:58 AM  Performed by: Yolanda Bonine, CRNAPre-anesthesia Checklist: Patient identified, Emergency Drugs available, Suction available, Patient being monitored and Timeout performed Patient Re-evaluated:Patient Re-evaluated prior to induction Oxygen Delivery Method: Circle system utilized Preoxygenation: Pre-oxygenation with 100% oxygen Induction Type: IV induction Ventilation: Mask ventilation without difficulty LMA: LMA inserted LMA Size: 4.0 Dental Injury: Teeth and Oropharynx as per pre-operative assessment

## 2023-12-24 NOTE — Interval H&P Note (Signed)
All questions answered

## 2023-12-24 NOTE — Anesthesia Preprocedure Evaluation (Addendum)
Anesthesia Evaluation  Patient identified by MRN, date of birth, ID band Patient awake    Reviewed: Allergy & Precautions, NPO status , Patient's Chart, lab work & pertinent test results  Airway Mallampati: II  TM Distance: >3 FB Neck ROM: Full    Dental no notable dental hx.    Pulmonary neg pulmonary ROS   Pulmonary exam normal        Cardiovascular negative cardio ROS Normal cardiovascular exam     Neuro/Psych negative neurological ROS  negative psych ROS   GI/Hepatic negative GI ROS, Neg liver ROS,,,  Endo/Other  negative endocrine ROS    Renal/GU negative Renal ROS     Musculoskeletal negative musculoskeletal ROS (+)    Abdominal   Peds  Hematology negative hematology ROS (+)   Anesthesia Other Findings RIGHT KNEE MEDIAL MENISCUS TEAR ACL TEAR  Reproductive/Obstetrics Hcg negative                             Anesthesia Physical Anesthesia Plan  ASA: 1  Anesthesia Plan: Regional and General   Post-op Pain Management: Regional block*   Induction: Intravenous  PONV Risk Score and Plan: 3 and Ondansetron, Dexamethasone, Midazolam and Treatment may vary due to age or medical condition  Airway Management Planned: LMA  Additional Equipment:   Intra-op Plan:   Post-operative Plan: Extubation in OR  Informed Consent: I have reviewed the patients History and Physical, chart, labs and discussed the procedure including the risks, benefits and alternatives for the proposed anesthesia with the patient or authorized representative who has indicated his/her understanding and acceptance.     Dental advisory given  Plan Discussed with: CRNA  Anesthesia Plan Comments:        Anesthesia Quick Evaluation

## 2023-12-24 NOTE — Op Note (Signed)
Orthopaedic Surgery Operative Note (CSN: [id number])  [facility]  1986/03/27 Date of Surgery: 12/24/2023   Diagnoses:  RIGHT KNEE MEDIAL MENISCUS TEAR, ACL TEAR  Procedure: Right ACL reconstruction with tibialis anterior allograft and internal brace Right medial meniscal repair   Operative Finding Exam under anesthesia: Unstable Lachman with no endpoint Suprapatellar pouch: Normal Patellofemoral Compartment: Normal Medial Compartment: Peripheral vertical tear of the medial meniscus appropriate for single anchor repair with a fiber stitch Lateral Compartment: Normal Intercondylar Notch: Complete ACL rupture with stump noted and some residual scarred fibers superiorly.  Successful completion of the planned procedure.  Great graft fixation and good stability of the case.  Post-operative plan: The patient will be weightbearing as tolerated in a knee immobilizer and transition to a hinged brace at her first clinic visit.  The patient will be discharged home.  DVT prophylaxis Aspirin 81 mg twice daily for 6 weeks.  Pain control with PRN pain medication preferring oral medicines.  Follow up plan will be scheduled in approximately 7 days for incision check and XR.  Post-Op Diagnosis: Same Surgeons:Primary: Bjorn Pippin, MD Assistants:Caroline McBane, PA-C Location: [facility] OR ROOM 1 Anesthesia: General with adductor canal Antibiotics: Ancef 2 g with local vancomycin powder 1 g at the surgical site Tourniquet time:  Total Tourniquet Time Documented: Thigh (Right) - 36 minutes Total: Thigh (Right) - 36 minutes  Estimated Blood Loss: Minimal Complications: None Specimens: None Implants: Implant Name Type Inv. Item Serial No. Manufacturer Lot No. LRB No. Used Action  TENDON ANTERIOR TIBIALIS - [id number] Tissue TENDON ANTERIOR TIBIALIS [id number] [facility] [id number] Right 1 Implanted  ANCHOR TIGHTROPE II 20 W/IB - [id number] Anchor ANCHOR TIGHTROPE II 20 W/IB   [employer] [id number] Right 1 Implanted  IMPLANT FIBERSTITCH 1.5 CVD - [id number] Anchor IMPLANT FIBERSTITCH 1.5 CVD  [employer] [id number] Right 1 Implanted  SCREW FT BIOCOMP 9X30 - [id number] Screw SCREW FT BIOCOMP 9X30  [employer] [id number] Right 1 Implanted    Indications for Surgery:   Betty Long is a 38 y.o. female with ACL rupture and medial meniscus tear.  Benefits and risks of operative and nonoperative management were discussed prior to surgery with patient/guardian(s) and informed consent form was completed.  Specific risks including infection, need for additional surgery, retear, stiffness, meniscal retear amongst others.   Procedure:   The patient was identified properly. Informed consent was obtained and the surgical site was marked. The patient was taken up to suite where general anesthesia was induced. The patient was placed in the supine position with a post against the surgical leg and a nonsterile tourniquet applied. The surgical leg was then prepped and draped usual sterile fashion.  A standard surgical timeout was performed.  2 standard anterior portals were made and diagnostic arthroscopy performed. Please note the findings as noted above.  We identified the medial meniscus tear.  This tissue stimulated and we used a single fiber stitch in vertical mattress fashion to secure his knee tear.  We were able to get good fixation overall.  A tibialis anterior graft was prepared on the back table to a size of 9 mm and 100 mm in length  We began arthroscopy and made our lateral and medial portals in the typical fashion. Fat pad was resected and diagnostic arthroscopy performed with the findings listed above.   The anterior cruciate ligament stump was debrided utilizing a shaver taking great care to preserve the remnant stump on the femur and the tibia for localization of  our tunnels. Once the remnant anterior cruciate ligament was removed and we obtained appropriate  visualization by performing a small notchplasty and confirmed that we had indeed identify the over-the-top position. We made small marks at the location of the aperture of the tibial and femoral tunnels and double checked our location prior to drilling.  Once we identified the appropriate position of the femoral aperture of the ACL we hyperflexed the knee and used a 7 mm offset guide through an anterior medial portal to drill a pin.  We then reamed a 9 mm tunnel by 30 mm in length.  We overreamed from the lateral side with a 4 mm reamer to allow passage of our button.  We shuttled a passing suture.  We then turned our attention to the tibial side.  We cleared the tissue from the tibial aperture using the anterior horn of the lateral meniscus as a guide.  We were able to then use a freehand technique to place a 2 4 pin in the appropriate anatomic position.  We reamed a complete tunnel.  We shuttled our suture for eventual passage.  We began passing the graft through the tibial tunnel in an antegrade fashion.  Femoral fixation was with a tight rope device and we checked its placement on the lateral periosteum with fluoroscopy.  We verified arthroscopically that there is no sign of graft impingement on the notch. We then cycled the knee multiple times and turned our attention to the tibia.  Tibia was fixed with a 9x 30 mm bio composite Ar[employer] screw.  There was good purchase of the screw and the screw protrusion thus we felt there is no need to back up this fixation.  At this point a gentle Lachman maneuver was performed and there is a stable endpoint and no translation.   Incisions closed with absorbable suture. The patient was awoken from general anesthesia and taken to the PACU in stable condition without complication.   Alfonse Alpers, PA-C, present and scrubbed throughout the case, critical for completion in a timely fashion, and for retraction, instrumentation, closure.

## 2023-12-25 ENCOUNTER — Encounter (HOSPITAL_BASED_OUTPATIENT_CLINIC_OR_DEPARTMENT_OTHER): Payer: Self-pay | Admitting: Orthopaedic Surgery

## 2024-01-01 ENCOUNTER — Other Ambulatory Visit: Payer: Self-pay

## 2024-01-01 ENCOUNTER — Ambulatory Visit (HOSPITAL_BASED_OUTPATIENT_CLINIC_OR_DEPARTMENT_OTHER): Payer: 59 | Attending: Family | Admitting: Physical Therapy

## 2024-01-01 ENCOUNTER — Encounter (HOSPITAL_BASED_OUTPATIENT_CLINIC_OR_DEPARTMENT_OTHER): Payer: Self-pay | Admitting: Physical Therapy

## 2024-01-01 DIAGNOSIS — R262 Difficulty in walking, not elsewhere classified: Secondary | ICD-10-CM | POA: Diagnosis not present

## 2024-01-01 DIAGNOSIS — M6281 Muscle weakness (generalized): Secondary | ICD-10-CM | POA: Insufficient documentation

## 2024-01-01 DIAGNOSIS — M25561 Pain in right knee: Secondary | ICD-10-CM | POA: Diagnosis not present

## 2024-01-01 DIAGNOSIS — S83241D Other tear of medial meniscus, current injury, right knee, subsequent encounter: Secondary | ICD-10-CM | POA: Diagnosis not present

## 2024-01-01 NOTE — Therapy (Signed)
OUTPATIENT PHYSICAL THERAPY EVALUATION   Patient Name: Betty Long [id number] MRN: [id number] DOB:08/07/1986, 38 y.o., female Today's Date: 01/01/2024  END OF SESSION:  PT End of Session - 01/01/24 1719     Visit Number 1    Number of Visits 25    Date for PT Re-Evaluation 03/26/24    Authorization Type [facility]    PT Start Time 1517    PT Stop Time 1600    PT Time Calculation (min) 43 min    Activity Tolerance Patient tolerated treatment well    Behavior During Therapy WFL for tasks assessed/performed             Past Medical History:  Diagnosis Date   Family history of breast cancer    Family history of melanoma    History of abnormal cervical Pap smear    ASCUS  ---  NEGATIVE HPV   Mass of left knee    Vaginal Pap smear, abnormal    Past Surgical History:  Procedure Laterality Date   ANTERIOR CRUCIATE LIGAMENT REPAIR Left 2010   COSMETIC SURGERY     laceration repair   KNEE ARTHROSCOPY Left 02/09/2014   Procedure: LEFT KNEE EXCISIONAL BIOPSY;  Surgeon: Eugenia Mcalpine, MD;  Location: [facility];  Service: Orthopedics;  Laterality: Left;   KNEE ARTHROSCOPY W/ LATERAL RELEASE Left 09-16-2011   PATELLA MALTRACKING   KNEE ARTHROSCOPY WITH MEDIAL MENISECTOMY Right 12/24/2023   Procedure: KNEE ARTHROSCOPY WITH MEDIAL MENISECTOMY;  Surgeon: Bjorn Pippin, MD;  Location: [facility];  Service: Orthopedics;  Laterality: Right;   MYRINGOTOMY     3mos 6 mos   TONSILLECTOMY  1993   Patient Active Problem List   Diagnosis Date Noted   Genetic testing 07/07/2021   Family history of breast cancer 06/21/2021   Family history of melanoma 06/21/2021   Indication for care in labor or delivery 10/22/2017   Indication for care in labor and delivery, antepartum 01/26/2015   NSVD (normal spontaneous vaginal delivery) 01/26/2015   S/P knee surgery 02/09/2014   Contraception management 05/12/2012   ASCUS on Pap smear 05/12/2012    REFERRING PROVIDER:   Bjorn Pippin, MD     REFERRING DIAG: S/P right medial and lateral meniscus repair,ACL reconstruction   Rationale for Evaluation and Treatment: Rehabilitation  THERAPY DIAG:  Acute pain of right knee  Difficulty in walking, not elsewhere classified  Muscle weakness (generalized)  ONSET DATE: DOS 12/24/23   SUBJECTIVE:  SUBJECTIVE STATEMENT: Was in a lot of pain after brace adjustment at f/u today.   PERTINENT HISTORY:  Lt ACLR and lateral release  PAIN:  Are you having pain? Yes: NPRS scale: 2 Pain location: Rt knee Pain description: uncomfortable, tight Aggravating factors: constant Relieving factors: ice  PRECAUTIONS:  Other: see WB precautions  RED FLAGS: None   WEIGHT BEARING RESTRICTIONS:  Yes through 2/20: WBAT 0-90, ROM 0-90, no tibial rotation   FALLS:  Has patient fallen in last 6 months? 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  PATIENT SURVEYS:  LEFS 15  COGNITIVE STATUS: Within functional limits for tasks assessed   SENSATION: WFL  EDEMA:  Yes: moderate  GAIT: EVAL: with bil axillary crutches   Body Part #1 Knee  PALPATION: EVAL: Good patellar mobility, no s/s of infection  LOWER EXTREMITY ROM:     Passive  Right eval   Hip flexion    Hip extension    Hip abduction    Hip adduction    Hip internal rotation    Hip external rotation    Knee flexion 40   Knee extension -3    (Blank rows = not tested)                                                                                                                                TREATMENT DATE:   Treatment                            1/31:  Fit brace, discussion of protocol Patellar mobs Passive knee flexion/ext Passive hamstring stretching Quad set, discussed HEP    PATIENT EDUCATION:  Education details: Teacher, music of condition, POC, HEP, exercise form/rationale Person educated: Patient Education method: Explanation, Demonstration, Tactile cues, Verbal cues, and Handouts Education comprehension: verbalized understanding, returned demonstration, verbal cues required, tactile cues required, and needs further education  HOME EXERCISE PROGRAM: Access Code: [email or web address] URL: [email or web address]    ASSESSMENT:  CLINICAL IMPRESSION: Patient is a 38 y.o. F who was seen today for physical therapy evaluation and treatment for s/p ACL and meniscus repair.     REHAB POTENTIAL: Good  CLINICAL DECISION MAKING: Stable/uncomplicated  EVALUATION COMPLEXITY: Low   GOALS: Goals reviewed with patient? Yes     SHORT TERM GOALS: goal date 4 wks post op  FWB without AD, able to ambulate household distances pain <=3/10 Baseline: see obj Goal status: INITIAL  2. ROM 0-90 without discomfort Baseline: see obj Goal status: INITIAL  3. Able to demo SLR without quad lag Baseline: see obj Goal status: INITIAL   LONG TERM GOALS: POC DATE   LEFS to at least 36/40 Baseline: see obj Goal  status: INITIAL   2.  Able to navigate stairs with proper form Baseline: unable at eval Goal status: INITIAL   3.  MMT hip & knee to age-appropriate levels with hand held dyno Baseline: not appropriate to test at eval Goal status: INITIAL   4.  Single leg balance control, pain <2/10, on stable and unstable surfaces Baseline: unable at eval Goal status: INITIAL   5.  Prepared to return to running & begin gentle plyometric program Baseline: will progress as appropraite Goal status: INITIAL   6.  Able to participate in play with kids  with recognition of precautions Baseline:  Goal status: INITIAL     PLAN:  PT FREQUENCY: 1-2x/week  PT DURATION: 12 weeks  PLANNED INTERVENTIONS: 97164- PT Re-evaluation, 97110-Therapeutic exercises, 97530- Therapeutic activity, 97112- Neuromuscular re-education, 97535- Self Care, 11914- Manual therapy, [phone]- Gait training, [phone]- Aquatic Therapy, Patient/Family education, Balance training, Stair training, Taping, Dry Needling, Joint mobilization, Spinal mobilization, Scar mobilization, Cryotherapy, and Moist heat.  PLAN FOR NEXT SESSION: per [facility] Protocol (on [employer])   Gwenlyn Found. Shalika Arntz PT, DPT 01/01/24 5:26 PM

## 2024-01-05 ENCOUNTER — Ambulatory Visit (HOSPITAL_BASED_OUTPATIENT_CLINIC_OR_DEPARTMENT_OTHER): Payer: 59 | Attending: Family | Admitting: Physical Therapy

## 2024-01-05 DIAGNOSIS — M25561 Pain in right knee: Secondary | ICD-10-CM | POA: Insufficient documentation

## 2024-01-05 DIAGNOSIS — M6281 Muscle weakness (generalized): Secondary | ICD-10-CM | POA: Diagnosis not present

## 2024-01-05 DIAGNOSIS — R262 Difficulty in walking, not elsewhere classified: Secondary | ICD-10-CM | POA: Insufficient documentation

## 2024-01-05 NOTE — Therapy (Signed)
 OUTPATIENT PHYSICAL THERAPY TREATMENT   Patient Name: Betty Long MRN: [id number] DOB:15-Sep-1986, 38 y.o., female Today's Date: 01/05/2024  END OF SESSION:  PT End of Session - 01/05/24 1030     Visit Number 2    Number of Visits 25    Date for PT Re-Evaluation 03/26/24    Authorization Type [facility]    PT Start Time 1021    PT Stop Time 1100    PT Time Calculation (min) 39 min    Activity Tolerance Patient tolerated treatment well    Behavior During Therapy WFL for tasks assessed/performed             Past Medical History:  Diagnosis Date   Family history of breast cancer    Family history of melanoma    History of abnormal cervical Pap smear    ASCUS  ---  NEGATIVE HPV   Mass of left knee    Vaginal Pap smear, abnormal    Past Surgical History:  Procedure Laterality Date   ANTERIOR CRUCIATE LIGAMENT REPAIR Left 2010   COSMETIC SURGERY     laceration repair   KNEE ARTHROSCOPY Left 02/09/2014   Procedure: LEFT KNEE EXCISIONAL BIOPSY;  Surgeon: Lamar Collet, MD;  Location: [facility];  Service: Orthopedics;  Laterality: Left;   KNEE ARTHROSCOPY W/ LATERAL RELEASE Left 09-16-2011   PATELLA MALTRACKING   KNEE ARTHROSCOPY WITH MEDIAL MENISECTOMY Right 12/24/2023   Procedure: KNEE ARTHROSCOPY WITH MEDIAL MENISECTOMY;  Surgeon: Cristy Bonner DASEN, MD;  [facility];  Service: Orthopedics;  Laterality: Right;   MYRINGOTOMY     3mos 6 mos   TONSILLECTOMY  1993   Patient Active Problem List   Diagnosis Date Noted   Genetic testing 07/07/2021   Family history of breast cancer 06/21/2021   Family history of melanoma 06/21/2021   Indication for care in labor or delivery 10/22/2017   Indication for care in labor and delivery, antepartum 01/26/2015   NSVD (normal spontaneous vaginal delivery) 01/26/2015   S/P knee surgery 02/09/2014   Contraception management 05/12/2012   ASCUS on Pap smear 05/12/2012    REFERRING PROVIDER:   Cristy Bonner DASEN, MD     REFERRING DIAG: S/P right medial and lateral meniscus repair,ACL reconstruction   Rationale for Evaluation and Treatment: Rehabilitation  THERAPY DIAG:  Acute pain of right knee  Difficulty in walking, not elsewhere classified  Muscle weakness (generalized)  ONSET DATE: DOS 12/24/23   SUBJECTIVE:  SUBJECTIVE STATEMENT: Pt reports that she is have pain in RLE where brace is rubbing/ pressing.  She reports that she was up on her feet a lot yesterday but did not stay on top taking her pain meds (tylenol ).    PERTINENT HISTORY:  Lt ACLR and lateral release  PAIN:  Are you having pain? Yes: NPRS scale: 2-3/10 Pain location: Rt knee Pain description: uncomfortable, tight Aggravating factors: constant Relieving factors: ice  PRECAUTIONS:  Other: see WB precautions  RED FLAGS: None   WEIGHT BEARING RESTRICTIONS:  Yes through 2/20: WBAT 0-90, ROM 0-90, no tibial rotation   FALLS:  Has patient fallen in last 6 months? 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  PATIENT SURVEYS:  LEFS 15  COGNITIVE STATUS: Within functional limits for tasks assessed   SENSATION: WFL  EDEMA:  Yes: moderate  GAIT: EVAL: with bil axillary crutches   Body Part #1 Knee  PALPATION: EVAL: Good patellar mobility, no s/s of infection  LOWER EXTREMITY ROM:     Passive  Right eval Right  2/4  Hip flexion    Hip extension    Hip abduction    Hip adduction    Hip internal rotation    Hip external rotation    Knee flexion 40 58  Knee  extension -3 -2   (Blank rows = not tested)                                                                                                                               TREATMENT DATE:  Treatment                            2/4:  Rt Patellar mobs Rt quad set x 5sec x 12 R SLR x 6, stopped due to cramping in prox thigh Passive R knee flexion/ext R hip abdct with quad set x 10 Application of reg KT tape to posterior R knee at area of pain and bruising to assist with desensitization and decompression of tissue; reviewed safe removal of tape Standing quad set with ant/post weight shifts  Gait with bilat crutch x 40 ft, cues for more upright posture-> single crutch with cues for placement and sequence x 40 ft - no pain discussed HEP and application of ace wrap or legging to reduce friction between brace and skin  Treatment                            1/31:  Fit brace, discussion of protocol Patellar mobs Passive knee flexion/ext Passive hamstring stretching Quad set, discussed HEP    PATIENT EDUCATION:  Education details: Teacher, Music of condition,  POC, HEP, exercise form/rationale Person educated: Patient Education method: Explanation, Demonstration, Tactile cues, Verbal cues,  Education comprehension: verbalized understanding, returned demonstration, verbal cues required, tactile cues required, and needs further education  HOME EXERCISE PROGRAM: Access Code: [email or web address] URL: [email or web address]    ASSESSMENT:  CLINICAL IMPRESSION: Patient is 1wk 5 days s/p ACL and meniscus repair. She reported some cramping in R ant hip with SLR after 6 reps.  PROM gradually improving.  Gait with single axillary crutch went well, and pt with good gait pattern.  Reviewed protocol with her.  Goals are ongoing.     REHAB POTENTIAL: Good  CLINICAL DECISION MAKING: Stable/uncomplicated  EVALUATION COMPLEXITY: Low   GOALS: Goals reviewed with patient? Yes     SHORT TERM  GOALS: goal date 4 wks post op  FWB without AD, able to ambulate household distances pain <=3/10 Baseline: see obj Goal status: INITIAL  2. ROM 0-90 without discomfort Baseline: see obj Goal status: INITIAL  3. Able to demo SLR without quad lag Baseline: see obj Goal status: INITIAL   LONG TERM GOALS: POC DATE   LEFS to at least 36/40 Baseline: see obj Goal status: INITIAL   2.  Able to navigate stairs with proper form Baseline: unable at eval Goal status: INITIAL   3.  MMT hip & knee to age-appropriate levels with hand held dyno Baseline: not appropriate to test at eval Goal status: INITIAL   4.  Single leg balance control, pain <2/10, on stable and unstable surfaces Baseline: unable at eval Goal status: INITIAL   5.  Prepared to return to running & begin gentle plyometric program Baseline: will progress as appropraite Goal status: INITIAL   6.  Able to participate in play with kids with recognition of precautions Baseline:  Goal status: INITIAL     PLAN:  PT FREQUENCY: 1-2x/week  PT DURATION: 12 weeks  PLANNED INTERVENTIONS: 97164- PT Re-evaluation, 97110-Therapeutic exercises, 97530- Therapeutic activity, 97112- Neuromuscular re-education, 97535- Self Care, 02859- Manual therapy, [phone]- Gait training, [phone]- Aquatic Therapy, Patient/Family education, Balance training, Stair training, Taping, Dry Needling, Joint mobilization, Spinal mobilization, Scar mobilization, Cryotherapy, and Moist heat.  PLAN FOR NEXT SESSION: per Cristy Protocol (on Jess's desk)   Delon Aquas, PTA 01/05/24 11:18 AM [facility] [address] [place], [place], [address] Phone: [phone]   Fax:  [phone]

## 2024-01-08 ENCOUNTER — Encounter (HOSPITAL_BASED_OUTPATIENT_CLINIC_OR_DEPARTMENT_OTHER): Payer: Self-pay | Admitting: Physical Therapy

## 2024-01-08 ENCOUNTER — Ambulatory Visit (HOSPITAL_BASED_OUTPATIENT_CLINIC_OR_DEPARTMENT_OTHER): Payer: 59 | Admitting: Physical Therapy

## 2024-01-08 DIAGNOSIS — M6281 Muscle weakness (generalized): Secondary | ICD-10-CM

## 2024-01-08 DIAGNOSIS — M25561 Pain in right knee: Secondary | ICD-10-CM

## 2024-01-08 DIAGNOSIS — R262 Difficulty in walking, not elsewhere classified: Secondary | ICD-10-CM | POA: Diagnosis not present

## 2024-01-08 NOTE — Therapy (Signed)
 OUTPATIENT PHYSICAL THERAPY TREATMENT   Patient Name: Betty Long [id number] MRN: [id number] DOB:05/16/1986, 38 y.o., female Today's Date: 01/08/2024  END OF SESSION:  PT End of Session - 01/08/24 1235     Visit Number 3    Number of Visits 25    Date for PT Re-Evaluation 03/26/24    Authorization Type [facility]    PT Start Time 1100    PT Stop Time 1147    PT Time Calculation (min) 47 min    Activity Tolerance Patient tolerated treatment well    Behavior During Therapy WFL for tasks assessed/performed              Past Medical History:  Diagnosis Date   Family history of breast cancer    Family history of melanoma    History of abnormal cervical Pap smear    ASCUS  ---  NEGATIVE HPV   Mass of left knee    Vaginal Pap smear, abnormal    Past Surgical History:  Procedure Laterality Date   ANTERIOR CRUCIATE LIGAMENT REPAIR Left 2010   COSMETIC SURGERY     laceration repair   KNEE ARTHROSCOPY Left 02/09/2014   Procedure: LEFT KNEE EXCISIONAL BIOPSY;  Surgeon: Lamar Collet, MD;  Location: [facility];  Service: Orthopedics;  Laterality: Left;   KNEE ARTHROSCOPY W/ LATERAL RELEASE Left 09-16-2011   PATELLA MALTRACKING   KNEE ARTHROSCOPY WITH MEDIAL MENISECTOMY Right 12/24/2023   Procedure: KNEE ARTHROSCOPY WITH MEDIAL MENISECTOMY;  Surgeon: Cristy Bonner DASEN, MD;  Location: [facility];  Service: Orthopedics;  Laterality: Right;   MYRINGOTOMY     3mos 6 mos   TONSILLECTOMY  1993   Patient Active Problem List   Diagnosis Date Noted   Genetic testing 07/07/2021   Family history of breast cancer 06/21/2021   Family history of melanoma 06/21/2021   Indication for care in labor or delivery 10/22/2017   Indication for care in labor and delivery, antepartum 01/26/2015   NSVD (normal spontaneous vaginal delivery) 01/26/2015   S/P knee surgery 02/09/2014   Contraception management 05/12/2012   ASCUS on Pap smear 05/12/2012    REFERRING PROVIDER:   Cristy Bonner DASEN, MD     REFERRING DIAG: S/P right medial and lateral meniscus repair,ACL reconstruction   Rationale for Evaluation and Treatment: Rehabilitation  THERAPY DIAG:  Acute pain of right knee  Difficulty in walking, not elsewhere classified  Muscle weakness (generalized)  ONSET DATE: DOS 12/24/23   SUBJECTIVE:  SUBJECTIVE STATEMENT: The patient reports the brace is still feeling like it is causing pressure on the back of her leg. She is otherwise doing well.    PERTINENT HISTORY:  Lt ACLR and lateral release  PAIN:  Are you having pain? Yes: NPRS scale: 2-3/10 Pain location: Rt knee Pain description: uncomfortable, tight Aggravating factors: constant Relieving factors: ice  PRECAUTIONS:  Other: see WB precautions  RED FLAGS: None   WEIGHT BEARING RESTRICTIONS:  Yes through 2/20: WBAT 0-90, ROM 0-90, no tibial rotation   FALLS:  Has patient fallen in last 6 months? 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  PATIENT SURVEYS:  LEFS 15  COGNITIVE STATUS: Within functional limits for tasks assessed   SENSATION: WFL  EDEMA:  Yes: moderate  GAIT: EVAL: with bil axillary crutches   Body Part #1 Knee  PALPATION: EVAL: Good patellar mobility, no s/s of infection  LOWER EXTREMITY ROM:     Passive  Right eval Right  2/4   Hip flexion     Hip extension     Hip abduction     Hip adduction     Hip internal rotation     Hip external rotation     Knee flexion 40 58 74  Knee extension -3 -2 -4   (Blank rows = not  tested)                                                                                                                               TREATMENT DATE:  2/7  Manual:  Passive extension stretching with joint distraction  Grade I and II PA and AP   There-ex:  SL SLR 3x10 review of RPE            Prone SLR 3x10             SL SLR with education on technique 3x10            Quad set 3x10   Reviewed and updated HEP  Self care: taping to reduce brace stresss       Treatment                            2/4:  Rt Patellar mobs Rt quad set x 5sec x 12 R SLR x 6, stopped due to cramping in prox thigh Passive R knee flexion/ext R hip abdct with quad set x 10 Application of reg KT tape to posterior R knee at area of pain and bruising to assist with desensitization and decompression of tissue; reviewed safe removal of tape Standing quad set with ant/post weight shifts  Gait with bilat crutch x 40 ft, cues for more upright  posture-> single crutch with cues for placement and sequence x 40 ft - no pain discussed HEP and application of ace wrap or legging to reduce friction between brace and skin  Treatment                            1/31:  Fit brace, discussion of protocol Patellar mobs Passive knee flexion/ext Passive hamstring stretching Quad set, discussed HEP    PATIENT EDUCATION:  Education details: Teacher, Music of condition, POC, HEP, exercise form/rationale Person educated: Patient Education method: Explanation, Demonstration, Tactile cues, Verbal cues,  Education comprehension: verbalized understanding, returned demonstration, verbal cues required, tactile cues required, and needs further education  HOME EXERCISE PROGRAM: Access Code: [email or web address] URL: [email or web address]    ASSESSMENT:  CLINICAL IMPRESSION: The patient continues to have cramping pain in her anterior hip.  We talked about grading her exercises today.  She is able to do 10 comfortably.  She  is advised over the next couple days she can increase that to 12 if she can.  She still has an extensor lag with straight leg raise.  She also continues to have limited extension.  She is advised to work on her extension until we see her again.  We reviewed how to work on her flexion.  Her flexion is still limited but has improved significantly since last visit.  She had good quad firing today.  Reviewed how to do a quad set at home.  Patient will benefit from further skilled therapy to continue to improve range of motion and quad firing.   REHAB POTENTIAL: Good  CLINICAL DECISION MAKING: Stable/uncomplicated  EVALUATION COMPLEXITY: Low   GOALS: Goals reviewed with patient? Yes     SHORT TERM GOALS: goal date 4 wks post op  FWB without AD, able to ambulate household distances pain <=3/10 Baseline: see obj Goal status: INITIAL  2. ROM 0-90 without discomfort Baseline: see obj Goal status: INITIAL  3. Able to demo SLR without quad lag Baseline: see obj Goal status: INITIAL   LONG TERM GOALS: POC DATE   LEFS to at least 36/40 Baseline: see obj Goal status: INITIAL   2.  Able to navigate stairs with proper form Baseline: unable at eval Goal status: INITIAL   3.  MMT hip & knee to age-appropriate levels with hand held dyno Baseline: not appropriate to test at eval Goal status: INITIAL   4.  Single leg balance control, pain <2/10, on stable and unstable surfaces Baseline: unable at eval Goal status: INITIAL   5.  Prepared to return to running & begin gentle plyometric program Baseline: will progress as appropraite Goal status: INITIAL   6.  Able to participate in play with kids with recognition of precautions Baseline:  Goal status: INITIAL     PLAN:  PT FREQUENCY: 1-2x/week  PT DURATION: 12 weeks  PLANNED INTERVENTIONS: 97164- PT Re-evaluation, 97110-Therapeutic exercises, 97530- Therapeutic activity, 97112- Neuromuscular re-education, 97535- Self Care,  97140- Manual therapy, [phone]- Gait training, 02886- Aquatic Therapy, Patient/Family education, Balance training, Stair training, Taping, Dry Needling, Joint mobilization, Spinal mobilization, Scar mobilization, Cryotherapy, and Moist heat.  PLAN FOR NEXT SESSION: per Cristy Protocol (on Jess's desk)   Alm Don, PT, DPT 01/08/24 12:58 PM [facility] [address] [place], [place], [address] Phone: [phone]   Fax:  [phone]

## 2024-01-12 ENCOUNTER — Encounter (HOSPITAL_BASED_OUTPATIENT_CLINIC_OR_DEPARTMENT_OTHER): Payer: Self-pay | Admitting: Physical Therapy

## 2024-01-12 ENCOUNTER — Ambulatory Visit (HOSPITAL_BASED_OUTPATIENT_CLINIC_OR_DEPARTMENT_OTHER): Payer: Self-pay | Admitting: Physical Therapy

## 2024-01-12 DIAGNOSIS — M6281 Muscle weakness (generalized): Secondary | ICD-10-CM

## 2024-01-12 DIAGNOSIS — M25561 Pain in right knee: Secondary | ICD-10-CM

## 2024-01-12 DIAGNOSIS — R262 Difficulty in walking, not elsewhere classified: Secondary | ICD-10-CM

## 2024-01-12 NOTE — Therapy (Signed)
OUTPATIENT PHYSICAL THERAPY TREATMENT   Patient Name: Betty Long [id number] MRN: [id number] DOB:06/01/1986, 39 y.o., female Today's Date: 01/12/2024  END OF SESSION:     Past Medical History:  Diagnosis Date   Family history of breast cancer    Family history of melanoma    History of abnormal cervical Pap smear    ASCUS  ---  NEGATIVE HPV   Mass of left knee    Vaginal Pap smear, abnormal    Past Surgical History:  Procedure Laterality Date   ANTERIOR CRUCIATE LIGAMENT REPAIR Left 2010   COSMETIC SURGERY     laceration repair   KNEE ARTHROSCOPY Left 02/09/2014   Procedure: LEFT KNEE EXCISIONAL BIOPSY;  Surgeon: Eugenia Mcalpine, MD;  Location: [facility];  Service: Orthopedics;  Laterality: Left;   KNEE ARTHROSCOPY W/ LATERAL RELEASE Left 09-16-2011   PATELLA MALTRACKING   KNEE ARTHROSCOPY WITH MEDIAL MENISECTOMY Right 12/24/2023   Procedure: KNEE ARTHROSCOPY WITH MEDIAL MENISECTOMY;  Surgeon: Bjorn Pippin, MD;  Location: [facility];  Service: Orthopedics;  Laterality: Right;   MYRINGOTOMY     3mos 6 mos   TONSILLECTOMY  1993   Patient Active Problem List   Diagnosis Date Noted   Genetic testing 07/07/2021   Family history of breast cancer 06/21/2021   Family history of melanoma 06/21/2021   Indication for care in labor or delivery 10/22/2017   Indication for care in labor and delivery, antepartum 01/26/2015   NSVD (normal spontaneous vaginal delivery) 01/26/2015   S/P knee surgery 02/09/2014   Contraception management 05/12/2012   ASCUS on Pap smear 05/12/2012    REFERRING PROVIDER:  Bjorn Pippin, MD     REFERRING DIAG: S/P right medial and lateral meniscus repair,ACL reconstruction   Rationale for Evaluation and Treatment: Rehabilitation  THERAPY DIAG:  No diagnosis found.  ONSET DATE: DOS 12/24/23   SUBJECTIVE:                                                                                                                                                                                            SUBJECTIVE STATEMENT: The patient reports the brace is still feeling like it is causing pressure on the back of her leg. She is otherwise doing well.    PERTINENT HISTORY:  Lt ACLR and lateral release  PAIN:  Are you having pain? Yes: NPRS scale: 2-3/10 Pain location: Rt knee Pain description: uncomfortable, tight Aggravating factors: constant Relieving factors: ice  PRECAUTIONS:  Other: see WB precautions  RED FLAGS: None   WEIGHT BEARING RESTRICTIONS:  Yes through 2/20: WBAT 0-90, ROM 0-90, no tibial  rotation   FALLS:  Has patient fallen in last 6 months? 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  PATIENT SURVEYS:  LEFS 15  COGNITIVE STATUS: Within functional limits for tasks assessed   SENSATION: WFL  EDEMA:  Yes: moderate  GAIT: EVAL: with bil axillary crutches   Body Part #1 Knee  PALPATION: EVAL: Good patellar mobility, no s/s of infection  LOWER EXTREMITY ROM:     Passive  Right eval Right  2/4   Hip flexion     Hip extension     Hip abduction     Hip adduction     Hip internal rotation     Hip external rotation     Knee flexion 40 58 74  Knee extension -3 -2 -4   (Blank rows = not tested)                                                                                                                               TREATMENT DATE:   2/11 Manual:  Passive extension stretching with joint distraction  Grade I and II PA and AP   There-ex:  SL SLR 3x10 review of RPE  SAQ in low range 3x10   Neuro re-ed:  Heel/toe with rock and quad fire 2x19  Standing slow march 3x10                          E-stim: russian: 10/50 with  ice to VMO with quad set            X12 min       2/7  Manual:  Passive extension stretching with joint distraction  Grade I and II PA and AP   There-ex:  SL SLR 3x10 review of RPE            Prone SLR 3x10             SL SLR with education on technique 3x10            Quad set 3x10   Reviewed and updated HEP  Self care: taping to reduce brace stresss       Treatment                            2/4:  Rt Patellar mobs Rt quad set x 5sec x 12 R SLR x 6, stopped due to cramping in prox thigh Passive R knee flexion/ext R hip abdct with quad set x 10 Application of reg KT tape to posterior R knee at area of pain and bruising to assist with desensitization and decompression of tissue; reviewed safe removal of tape Standing quad  set with ant/post weight shifts  Gait with bilat crutch x 40 ft, cues for more upright posture-> single crutch with cues for placement and sequence x 40 ft - no pain discussed HEP and application of ace wrap or legging to reduce friction between brace and skin  Treatment                            1/31:  Fit brace, discussion of protocol Patellar mobs Passive knee flexion/ext Passive hamstring stretching Quad set, discussed HEP    PATIENT EDUCATION:  Education details: Teacher, music of condition, POC, HEP, exercise form/rationale Person educated: Patient Education method: Explanation, Demonstration, Tactile cues, Verbal cues,  Education comprehension: verbalized understanding, returned demonstration, verbal cues required, tactile cues required, and needs further education  HOME EXERCISE PROGRAM: Access Code: [email or web address] URL: [email or web address]    ASSESSMENT:  CLINICAL IMPRESSION: The patient has had some limitation 2nd to pain in her hip. We tried a low range SAQ ot wqork on the quad without irritating her hip. We worked on heel/toe roxlk for involuntary quad firing as well as weight bearing with UE support. She tolerated well.  She was advised to use her crutch for long range ambulation if needed. We worked on [employer] for quad firing. Therapy will continue to progress as tolerated.      REHAB POTENTIAL: Good  CLINICAL DECISION MAKING: Stable/uncomplicated  EVALUATION COMPLEXITY: Low   GOALS: Goals reviewed with patient? Yes     SHORT TERM GOALS: goal date 4 wks post op  FWB without AD, able to ambulate household distances pain <=3/10 Baseline: see obj Goal status: INITIAL  2. ROM 0-90 without discomfort Baseline: see obj Goal status: INITIAL  3. Able to demo SLR without quad lag Baseline: see obj Goal status: INITIAL   LONG TERM GOALS: POC DATE   LEFS to at least 36/40 Baseline: see obj Goal status: INITIAL   2.  Able to navigate stairs with proper form Baseline: unable at eval Goal status: INITIAL   3.  MMT hip & knee to age-appropriate levels with hand held dyno Baseline: not appropriate to test at eval Goal status: INITIAL   4.  Single leg balance control, pain <2/10, on stable and unstable surfaces Baseline: unable at eval Goal status: INITIAL   5.  Prepared to return to running & begin gentle plyometric program Baseline: will progress as appropraite Goal status: INITIAL   6.  Able to participate in play with kids with recognition of precautions Baseline:  Goal status: INITIAL     PLAN:  PT FREQUENCY: 1-2x/week  PT DURATION: 12 weeks  PLANNED INTERVENTIONS: 97164- PT Re-evaluation, 97110-Therapeutic exercises, 97530- Therapeutic activity, 97112- Neuromuscular re-education, 97535- Self Care, 16109- Manual therapy, [phone]- Gait training, [phone]- Aquatic Therapy, Patient/Family education, Balance training, Stair training, Taping, Dry Needling, Joint mobilization, Spinal mobilization, Scar mobilization, Cryotherapy, and Moist heat.  PLAN FOR NEXT SESSION: per Everardo [place] Protocol (on Jess's desk)   Lorayne Bender, PT, DPT 01/12/24 10:05 AM [facility] [address] [place], [place], [address] Phone: [phone]   Fax:  [phone]

## 2024-01-15 ENCOUNTER — Encounter (HOSPITAL_BASED_OUTPATIENT_CLINIC_OR_DEPARTMENT_OTHER): Payer: 59 | Admitting: Physical Therapy

## 2024-01-19 ENCOUNTER — Ambulatory Visit (HOSPITAL_BASED_OUTPATIENT_CLINIC_OR_DEPARTMENT_OTHER): Payer: 59 | Admitting: Physical Therapy

## 2024-01-19 ENCOUNTER — Encounter (HOSPITAL_BASED_OUTPATIENT_CLINIC_OR_DEPARTMENT_OTHER): Payer: Self-pay | Admitting: Physical Therapy

## 2024-01-19 DIAGNOSIS — M25561 Pain in right knee: Secondary | ICD-10-CM

## 2024-01-19 DIAGNOSIS — R262 Difficulty in walking, not elsewhere classified: Secondary | ICD-10-CM

## 2024-01-19 DIAGNOSIS — M6281 Muscle weakness (generalized): Secondary | ICD-10-CM | POA: Diagnosis not present

## 2024-01-19 NOTE — Therapy (Signed)
OUTPATIENT PHYSICAL THERAPY TREATMENT   Patient Name: Betty Long NWGNFAO MRN: [id number] DOB:[birth year], 38 y.o., female Today's Date: 01/19/2024  END OF SESSION:  PT End of Session - 01/19/24 1109     Visit Number 5    Number of Visits 25    Date for PT Re-Evaluation 03/26/24    PT Start Time 1100    PT Stop Time 1154    PT Time Calculation (min) 54 min    Activity Tolerance Patient tolerated treatment well    Behavior During Therapy WFL for tasks assessed/performed               Past Medical History:  Diagnosis Date   Family history of breast cancer    Family history of melanoma    History of abnormal cervical Pap smear    ASCUS  ---  NEGATIVE HPV   Mass of left knee    Vaginal Pap smear, abnormal    Past Surgical History:  Procedure Laterality Date   ANTERIOR CRUCIATE LIGAMENT REPAIR Left 2010   COSMETIC SURGERY     laceration repair   KNEE ARTHROSCOPY Left 02/09/2014   Procedure: LEFT KNEE EXCISIONAL BIOPSY;  Surgeon: Eugenia Mcalpine, MD;  Location: [facility];  Service: Orthopedics;  Laterality: Left;   KNEE ARTHROSCOPY W/ LATERAL RELEASE Left 09-16-2011   PATELLA MALTRACKING   KNEE ARTHROSCOPY WITH MEDIAL MENISECTOMY Right 12/24/2023   Procedure: KNEE ARTHROSCOPY WITH MEDIAL MENISECTOMY;  Surgeon: Bjorn Pippin, MD;  Location: [facility];  Service: Orthopedics;  Laterality: Right;   MYRINGOTOMY     3mos 6 mos   TONSILLECTOMY  1993   Patient Active Problem List   Diagnosis Date Noted   Genetic testing 07/07/2021   Family history of breast cancer 06/21/2021   Family history of melanoma 06/21/2021   Indication for care in labor or delivery 10/22/2017   Indication for care in labor and delivery, antepartum 01/26/2015   NSVD (normal spontaneous vaginal delivery) 01/26/2015   S/P knee surgery 02/09/2014   Contraception management 05/12/2012   ASCUS on Pap smear 05/12/2012    REFERRING PROVIDER:  Bjorn Pippin, MD      REFERRING DIAG: S/P right medial and lateral meniscus repair,ACL reconstruction   Rationale for Evaluation and Treatment: Rehabilitation  THERAPY DIAG:  Acute pain of right knee  Difficulty in walking, not elsewhere classified  Muscle weakness (generalized)  ONSET DATE: DOS 12/24/23   SUBJECTIVE:  SUBJECTIVE STATEMENT: The patient reports the brace is still feeling like it is causing pressure on the back of her leg. She is otherwise doing well.    PERTINENT HISTORY:  Lt ACLR and lateral release  PAIN:  Are you having pain? Yes: NPRS scale: 2-3/10 Pain location: Rt knee Pain description: uncomfortable, tight Aggravating factors: constant Relieving factors: ice  PRECAUTIONS:  Other: see WB precautions  RED FLAGS: None   WEIGHT BEARING RESTRICTIONS:  Yes through 2/20: WBAT 0-90, ROM 0-90, no tibial rotation   FALLS:  Has patient fallen in last 6 months? 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  PATIENT SURVEYS:  LEFS 15  COGNITIVE STATUS: Within functional limits for tasks assessed   SENSATION: WFL  EDEMA:  Yes: moderate  GAIT: EVAL: with bil axillary crutches   Body Part #1 Knee  PALPATION: EVAL: Good patellar mobility, no s/s of infection  LOWER EXTREMITY ROM:     Passive  Right eval Right  2/4   Hip flexion     Hip extension     Hip abduction     Hip adduction     Hip internal rotation     Hip external rotation     Knee flexion 40 58 74  Knee extension -3 -2 -4   (Blank rows = not tested)                                                                                                                                TREATMENT DATE:   2/18 Manual:  Passive extension stretching with joint distraction  Grade I and II PA and AP   There- act  Reviewed squat 0-45 with cuing for technique.     Neuro re-ed:  TKE yellow 2x10                          E-stim: russian: 10/30 to VMO with quad set            X12 min    2/11 Manual:  Passive extension stretching with joint distraction  Grade I and II PA and AP   There-ex:  SL SLR 3x10 review of RPE  SAQ in low range 3x10   Neuro re-ed:  Heel/toe with rock and quad fire 2x19  Standing slow march 3x10                          E-stim: russian: 10/50 with ice to VMO with quad set            X12 min       2/7  Manual:  Passive extension stretching with joint distraction  Grade  I and II PA and AP   There-ex:  SL SLR 3x10 review of RPE            Prone SLR 3x10             SL SLR with education on technique 3x10            Quad set 3x10   Reviewed and updated HEP  Self care: taping to reduce brace stresss       Treatment                            2/4:  Rt Patellar mobs Rt quad set x 5sec x 12 R SLR x 6, stopped due to cramping in prox thigh Passive R knee flexion/ext R hip abdct with quad set x 10 Application of reg KT tape to posterior R knee at area of pain and bruising to assist with desensitization and decompression of tissue; reviewed safe removal of tape Standing quad set with ant/post weight shifts  Gait with bilat crutch x 40 ft, cues for more upright posture-> single crutch with cues for placement and sequence x 40 ft - no pain discussed HEP and application of ace wrap or legging to reduce friction between brace and skin  Treatment                            1/31:  Fit brace, discussion of protocol Patellar mobs Passive knee flexion/ext Passive hamstring stretching Quad set, discussed HEP    PATIENT  EDUCATION:  Education details: Teacher, music of condition, POC, HEP, exercise form/rationale Person educated: Patient Education method: Explanation, Demonstration, Tactile cues, Verbal cues,  Education comprehension: verbalized understanding, returned demonstration, verbal cues required, tactile cues required, and needs further education  HOME EXERCISE PROGRAM: Access Code: [email or web address] URL: [email or web address]    ASSESSMENT:  CLINICAL IMPRESSION: Therapy focused mostly on ROM.today. Her extension was limited when she first came in. She was encouraged to be aggressive with her extension. She was also encouraged to be agressive with her flexion. We gave her low range squats to get her more confident in her ability to bend. We also worked on [employer] with light resistance. Therapy will continue to progress as tolerated.     REHAB POTENTIAL: Good  CLINICAL DECISION MAKING: Stable/uncomplicated  EVALUATION COMPLEXITY: Low   GOALS: Goals reviewed with patient? Yes     SHORT TERM GOALS: goal date 4 wks post op  FWB without AD, able to ambulate household distances pain <=3/10 Baseline: see obj Goal status: INITIAL  2. ROM 0-90 without discomfort Baseline: see obj Goal status: INITIAL  3. Able to demo SLR without quad lag Baseline: see obj Goal status: INITIAL   LONG TERM GOALS: POC DATE   LEFS to at least 36/40 Baseline: see obj Goal status: INITIAL   2.  Able to navigate stairs with proper form Baseline: unable at eval Goal status: INITIAL   3.  MMT hip & knee to age-appropriate levels with hand held dyno Baseline: not appropriate to test at eval Goal status: INITIAL   4.  Single leg balance control, pain <2/10, on stable and unstable surfaces Baseline: unable at eval Goal status: INITIAL   5.  Prepared to return to running & begin gentle plyometric program Baseline: will progress as appropraite Goal status: INITIAL   6.  Able to participate in play  with kids with recognition of precautions  Baseline:  Goal status: INITIAL     PLAN:  PT FREQUENCY: 1-2x/week  PT DURATION: 12 weeks  PLANNED INTERVENTIONS: 97164- PT Re-evaluation, 97110-Therapeutic exercises, 97530- Therapeutic activity, 97112- Neuromuscular re-education, 97535- Self Care, 16109- Manual therapy, [phone]- Gait training, [phone]- Aquatic Therapy, Patient/Family education, Balance training, Stair training, Taping, Dry Needling, Joint mobilization, Spinal mobilization, Scar mobilization, Cryotherapy, and Moist heat.  PLAN FOR NEXT SESSION: per Everardo Pacific Protocol (on Jess's desk)   Lorayne Bender, PT, DPT 01/19/24 12:23 PM [facility] [address] [place], [place], [address] Phone: [phone]   Fax:  [phone]

## 2024-01-23 NOTE — Therapy (Unsigned)
 OUTPATIENT PHYSICAL THERAPY TREATMENT   Patient Name: Betty Long UEAVWUJ MRN: [id number] DOB:1986/04/08, 38 y.o., female Today's Date: 01/27/2024  END OF SESSION:  PT End of Session - 01/27/24 1043     Visit Number 6    Number of Visits 25    Date for PT Re-Evaluation 03/26/24    Authorization Type [facility]    PT Start Time 1012    PT Stop Time 1055    PT Time Calculation (min) 43 min    Activity Tolerance Patient tolerated treatment well    Behavior During Therapy WFL for tasks assessed/performed                Past Medical History:  Diagnosis Date   Family history of breast cancer    Family history of melanoma    History of abnormal cervical Pap smear    ASCUS  ---  NEGATIVE HPV   Mass of left knee    Vaginal Pap smear, abnormal    Past Surgical History:  Procedure Laterality Date   ANTERIOR CRUCIATE LIGAMENT REPAIR Left 2010   COSMETIC SURGERY     laceration repair   KNEE ARTHROSCOPY Left 02/09/2014   Procedure: LEFT KNEE EXCISIONAL BIOPSY;  Surgeon: Eugenia Mcalpine, MD;  Location: [facility];  Service: Orthopedics;  Laterality: Left;   KNEE ARTHROSCOPY W/ LATERAL RELEASE Left 09-16-2011   PATELLA MALTRACKING   KNEE ARTHROSCOPY WITH MEDIAL MENISECTOMY Right 12/24/2023   Procedure: KNEE ARTHROSCOPY WITH MEDIAL MENISECTOMY;  Surgeon: Bjorn Pippin, MD;  Location: [facility];  Service: Orthopedics;  Laterality: Right;   MYRINGOTOMY     3mos 6 mos   TONSILLECTOMY  1993   Patient Active Problem List   Diagnosis Date Noted   Genetic testing 07/07/2021   Family history of breast cancer 06/21/2021   Family history of melanoma 06/21/2021   Indication for care in labor or delivery 10/22/2017   Indication for care in labor and delivery, antepartum 01/26/2015   NSVD (normal spontaneous vaginal delivery) 01/26/2015   S/P knee surgery 02/09/2014   Contraception management 05/12/2012   ASCUS on Pap smear 05/12/2012    REFERRING PROVIDER:   Bjorn Pippin, MD     REFERRING DIAG: S/P right medial and lateral meniscus repair,ACL reconstruction   Rationale for Evaluation and Treatment: Rehabilitation  THERAPY DIAG:  Acute pain of right knee  Difficulty in walking, not elsewhere classified  Muscle weakness (generalized)  ONSET DATE: DOS 12/24/23   SUBJECTIVE:  SUBJECTIVE STATEMENT: Pt states that MD is happy with progress. She said it goes straight after 15 minutes.     PERTINENT HISTORY:  Lt ACLR and lateral release  PAIN:  Are you having pain? Yes: NPRS scale: 2-3/10 Pain location: Rt knee Pain description: uncomfortable, tight Aggravating factors: constant Relieving factors: ice  PRECAUTIONS:  Other: see WB precautions  RED FLAGS: None   WEIGHT BEARING RESTRICTIONS:  Yes through 2/20: WBAT 0-90, ROM 0-90, no tibial rotation   FALLS:  Has patient fallen in last 6 months? 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  PATIENT SURVEYS:  LEFS 15  COGNITIVE STATUS: Within functional limits for tasks assessed   SENSATION: WFL  EDEMA:  Yes: moderate  GAIT: EVAL: with bil axillary crutches   Body Part #1 Knee  PALPATION: EVAL: Good patellar mobility, no s/s of infection  LOWER EXTREMITY ROM:     Passive  Right eval Right  2/4   Hip flexion     Hip extension     Hip abduction     Hip adduction     Hip internal rotation     Hip external rotation     Knee flexion 40 58 74  Knee extension -3 -2 -4   (Blank rows = not tested)                                                                                                                                TREATMENT DATE:  02/26 Manual:  Passive extension stretching with joint distraction  Grade I and II PA and AP  STM to HS  There Ex: Seated with leg into extension with suspension on chair with 10lb weight to push into extension - to tolerance. X2 Squats to tolerance at counter.    Neuro re-ed:  TKE yellow 2x10  Attempted SLR with quad contraction  LAQ with focus on eccentric control.               Gait:  focus on heel to toe and knee flexion.   2/18 Manual:  Passive extension stretching with joint distraction  Grade I and II PA and AP   There- act  Reviewed squat 0-45 with cuing for technique.     Neuro re-ed:  TKE yellow 2x10                          E-stim: russian: 10/30 to VMO with quad set            X12 min    2/11 Manual:  Passive extension stretching with joint distraction  Grade I and II PA and AP   There-ex:  SL SLR 3x10 review of RPE  SAQ in low range 3x10   Neuro re-ed:  Heel/toe with rock and quad fire 2x19  Standing slow march 3x10                          E-stim: russian: 10/50 with ice to VMO with quad set            X12 min       2/7  Manual:  Passive extension stretching with joint distraction  Grade I and II PA and AP   There-ex:  SL SLR 3x10 review of RPE            Prone SLR 3x10             SL SLR with education on technique 3x10            Quad set 3x10   Reviewed and updated HEP  Self care: taping to reduce brace stresss       Treatment                            2/4:  Rt Patellar mobs Rt quad set x 5sec x 12 R SLR x 6, stopped due to cramping in prox thigh Passive R knee flexion/ext R hip abdct with quad set x 10 Application of reg KT tape to posterior R knee at area of pain and bruising to assist with desensitization and decompression of tissue; reviewed safe removal of tape Standing quad set with ant/post weight  shifts  Gait with bilat crutch x 40 ft, cues for more upright posture-> single crutch with cues for placement and sequence x 40 ft - no pain discussed HEP and application of ace wrap or legging to reduce friction between brace and skin  Treatment                            1/31:  Fit brace, discussion of protocol Patellar mobs Passive knee flexion/ext Passive hamstring stretching Quad set, discussed HEP    PATIENT EDUCATION:  Education details: Teacher, music of condition, POC, HEP, exercise form/rationale Person educated: Patient Education method: Explanation, Demonstration, Tactile cues, Verbal cues,  Education comprehension: verbalized understanding, returned demonstration, verbal cues required, tactile cues required, and needs further education  HOME EXERCISE PROGRAM: Access Code: [email or web address] URL: [email or web address]    ASSESSMENT:  CLINICAL IMPRESSION: Therapy focused on ROM, quad strengthening and and some gait today. Her extension continues to be limited due to a reported catch. Once catch is gone, she notes improvement with pain and ROM. Assessed gait without brace. Pt with tendency to hip hike and swing leg forward with knee extension. Revised heel to toe gait pattern with knee flexion emphasis. Continues to show small progressions, but improved quad control.  Therapy will continue to progress as tolerated.     REHAB POTENTIAL: Good  CLINICAL DECISION MAKING: Stable/uncomplicated  EVALUATION COMPLEXITY: Low   GOALS: Goals reviewed with patient? Yes     SHORT TERM GOALS: goal date 4 wks post op  FWB without AD, able to ambulate household distances pain <=3/10 Baseline: see obj Goal status: INITIAL  2. ROM 0-90 without discomfort Baseline: see obj Goal status: INITIAL  3. Able to demo SLR without quad lag Baseline: see obj Goal status: INITIAL   LONG TERM GOALS: POC DATE   LEFS to at least 36/40 Baseline: see obj Goal status: INITIAL  2.   Able to navigate stairs with proper form Baseline: unable at eval Goal status: INITIAL   3.  MMT hip & knee to age-appropriate levels with hand held dyno Baseline: not appropriate to test at eval Goal status: INITIAL   4.  Single leg balance control, pain <2/10, on stable and unstable surfaces Baseline: unable at eval Goal status: INITIAL   5.  Prepared to return to running & begin gentle plyometric program Baseline: will progress as appropraite Goal status: INITIAL   6.  Able to participate in play with kids with recognition of precautions Baseline:  Goal status: INITIAL     PLAN:  PT FREQUENCY: 1-2x/week  PT DURATION: 12 weeks  PLANNED INTERVENTIONS: 97164- PT Re-evaluation, 97110-Therapeutic exercises, 97530- Therapeutic activity, 97112- Neuromuscular re-education, 97535- Self Care, 16109- Manual therapy, [phone]- Gait training, [phone]- Aquatic Therapy, Patient/Family education, Balance training, Stair training, Taping, Dry Needling, Joint mobilization, Spinal mobilization, Scar mobilization, Cryotherapy, and Moist heat.  PLAN FOR NEXT SESSION: per Everardo Pacific Protocol (on Jess's desk)  Royal Hawthorn PT, DPT 01/27/24  10:59 AM   01/27/24 10:59 AM [facility] [address] [place], [place], [address] Phone: [phone]   Fax:  [phone]

## 2024-01-27 ENCOUNTER — Encounter (HOSPITAL_BASED_OUTPATIENT_CLINIC_OR_DEPARTMENT_OTHER): Payer: Self-pay | Admitting: Physical Therapy

## 2024-01-27 ENCOUNTER — Ambulatory Visit (HOSPITAL_BASED_OUTPATIENT_CLINIC_OR_DEPARTMENT_OTHER): Payer: 59 | Admitting: Physical Therapy

## 2024-01-27 DIAGNOSIS — M6281 Muscle weakness (generalized): Secondary | ICD-10-CM | POA: Diagnosis not present

## 2024-01-27 DIAGNOSIS — M25561 Pain in right knee: Secondary | ICD-10-CM

## 2024-01-27 DIAGNOSIS — R262 Difficulty in walking, not elsewhere classified: Secondary | ICD-10-CM

## 2024-01-28 NOTE — Therapy (Unsigned)
 OUTPATIENT PHYSICAL THERAPY TREATMENT   Patient Name: Betty Long MRN: [id number] DOB:October 18, 1986, 38 y.o., female Today's Date: 01/29/2024  END OF SESSION:  PT End of Session - 01/29/24 1017     Visit Number 7    Number of Visits 25    Date for PT Re-Evaluation 03/26/24    Authorization Type MC    PT Start Time 0930    PT Stop Time 1015    PT Time Calculation (min) 45 min    Activity Tolerance Patient tolerated treatment well    Behavior During Therapy WFL for tasks assessed/performed                 Past Medical History:  Diagnosis Date   Family history of breast cancer    Family history of melanoma    History of abnormal cervical Pap smear    ASCUS  ---  NEGATIVE HPV   Mass of left knee    Vaginal Pap smear, abnormal    Past Surgical History:  Procedure Laterality Date   ANTERIOR CRUCIATE LIGAMENT REPAIR Left 2010   COSMETIC SURGERY     laceration repair   KNEE ARTHROSCOPY Left 02/09/2014   Procedure: LEFT KNEE EXCISIONAL BIOPSY;  Surgeon: Eugenia Mcalpine, MD;  Location: [facility] ;  Service: Orthopedics;  Laterality: Left;   KNEE ARTHROSCOPY W/ LATERAL RELEASE Left 09-16-2011   PATELLA MALTRACKING   KNEE ARTHROSCOPY WITH MEDIAL MENISECTOMY Right 12/24/2023   Procedure: KNEE ARTHROSCOPY WITH MEDIAL MENISECTOMY;  Surgeon: Bjorn Pippin, MD;  Location: [facility];  Service: Orthopedics;  Laterality: Right;   MYRINGOTOMY     3mos 6 mos   TONSILLECTOMY  1993   Patient Active Problem List   Diagnosis Date Noted   Genetic testing 07/07/2021   Family history of breast cancer 06/21/2021   Family history of melanoma 06/21/2021   Indication for care in labor or delivery 10/22/2017   Indication for care in labor and delivery, antepartum 01/26/2015   NSVD (normal spontaneous vaginal delivery) 01/26/2015   S/P knee surgery 02/09/2014   Contraception management 05/12/2012   ASCUS on Pap smear 05/12/2012    REFERRING  PROVIDER:  Bjorn Pippin, MD     REFERRING DIAG: S/P right medial and lateral meniscus repair,ACL reconstruction   Rationale for Evaluation and Treatment: Rehabilitation  THERAPY DIAG:  Acute pain of right knee  Difficulty in walking, not elsewhere classified  Muscle weakness (generalized)  ONSET DATE: DOS 12/24/23   SUBJECTIVE:  SUBJECTIVE STATEMENT: Pt states that MD is happy with progress. She said it goes straight after 15 minutes.     PERTINENT HISTORY:  Lt ACLR and lateral release  PAIN:  Are you having pain? Yes: NPRS scale: 2-3/10 Pain location: Rt knee Pain description: uncomfortable, tight Aggravating factors: constant Relieving factors: ice  PRECAUTIONS:  Other: see WB precautions  RED FLAGS: None   WEIGHT BEARING RESTRICTIONS:  Yes through 2/20: WBAT 0-90, ROM 0-90, no tibial rotation   FALLS:  Has patient fallen in last 6 months? 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  PATIENT SURVEYS:  LEFS 15  COGNITIVE STATUS: Within functional limits for tasks assessed   SENSATION: WFL  EDEMA:  Yes: moderate  GAIT: EVAL: with bil axillary crutches   Body Part #1 Knee  PALPATION: EVAL: Good patellar mobility, no s/s of infection  LOWER EXTREMITY ROM:     Passive  Right eval Right  2/4   Hip flexion     Hip extension     Hip abduction     Hip adduction     Hip internal rotation     Hip external rotation     Knee flexion 40 58 74  Knee extension -3 -2 -4   (Blank rows = not tested)                                                                                                                                TREATMENT DATE:  02/28 Manual:  Contract relax into ext, 5 sec holds  STM to HS  There Ex: Recumbent bike with half rotations to tolerance x 5 min Seated with leg into extension with suspension on chair with 15lb weight to push into extension - to tolerance. X2 SL Leg press with no weight Heel raises x15   Neuro re-ed:  TKE yellow 2x10  LAQ with focus on eccentric control.                02/26 Manual:  Passive extension stretching with joint distraction  Grade I and II PA and AP  STM to HS  There Ex: Seated with leg into extension with suspension on chair with 10lb weight to push into extension - to tolerance. X2 Squats to tolerance at counter.    Neuro re-ed:  TKE yellow 2x10  Attempted SLR with quad contraction  LAQ with focus on eccentric control.               Gait:  focus on heel to toe and knee flexion.   2/18 Manual:  Passive extension stretching with joint distraction  Grade I and II PA and AP   There- act  Reviewed squat 0-45 with cuing for technique.  Neuro re-ed:  TKE yellow 2x10                          E-stim: russian: 10/30 to VMO with quad set            X12 min    2/11 Manual:  Passive extension stretching with joint distraction  Grade I and II PA and AP   There-ex:  SL SLR 3x10 review of RPE  SAQ in low range 3x10   Neuro re-ed:  Heel/toe with rock and quad fire 2x19  Standing slow march 3x10                          E-stim: russian: 10/50 with ice to VMO with quad set            X12 min      PATIENT EDUCATION:  Education details: Teacher, music of condition, POC, HEP, exercise form/rationale Person educated: Patient Education method: Explanation, Demonstration, Tactile cues, Verbal cues,  Education comprehension: verbalized understanding, returned demonstration, verbal cues required, tactile cues required, and needs further  education  HOME EXERCISE PROGRAM: Access Code: [email or web address] URL: [email or web address]    ASSESSMENT:  CLINICAL IMPRESSION: Therapy continued to focus heavily on extension ROM, quad strengthening and some gait today. Her extension continues to be limited due to a reported catch. Once catch is gone, she notes improvement with pain and ROM. Pt requires cues for proper gait due to lack of knee flexion and limited extension. Revised heel to toe gait pattern with knee flexion emphasis. Continues to show small progressions, but improved quad control.  Therapy will continue to progress as tolerated.     REHAB POTENTIAL: Good  CLINICAL DECISION MAKING: Stable/uncomplicated  EVALUATION COMPLEXITY: Low   GOALS: Goals reviewed with patient? Yes     SHORT TERM GOALS: goal date 4 wks post op  FWB without AD, able to ambulate household distances pain <=3/10 Baseline: see obj Goal status: INITIAL  2. ROM 0-90 without discomfort Baseline: see obj Goal status: INITIAL  3. Able to demo SLR without quad lag Baseline: see obj Goal status: INITIAL   LONG TERM GOALS: POC DATE   LEFS to at least 36/40 Baseline: see obj Goal status: INITIAL   2.  Able to navigate stairs with proper form Baseline: unable at eval Goal status: INITIAL   3.  MMT hip & knee to age-appropriate levels with hand held dyno Baseline: not appropriate to test at eval Goal status: INITIAL   4.  Single leg balance control, pain <2/10, on stable and unstable surfaces Baseline: unable at eval Goal status: INITIAL   5.  Prepared to return to running & begin gentle plyometric program Baseline: will progress as appropraite Goal status: INITIAL   6.  Able to participate in play with kids with recognition of precautions Baseline:  Goal status: INITIAL     PLAN:  PT FREQUENCY: 1-2x/week  PT DURATION: 12 weeks  PLANNED INTERVENTIONS: 97164- PT Re-evaluation, 97110-Therapeutic exercises, 97530-  Therapeutic activity, 97112- Neuromuscular re-education, 97535- Self Care, 69629- Manual therapy, [phone]- Gait training, [phone]- Aquatic Therapy, Patient/Family education, Balance training, Stair training, Taping, Dry Needling, Joint mobilization, Spinal mobilization, Scar mobilization, Cryotherapy, and Moist heat.  PLAN FOR NEXT SESSION: per Everardo Pacific Protocol (on Jess's desk)  Royal Hawthorn PT, DPT 01/29/24  10:28 AM   01/29/24 10:28 AM [facility] [address] [place], [place], [address] Phone:  [phone]   Fax:  [phone]

## 2024-01-29 ENCOUNTER — Encounter (HOSPITAL_BASED_OUTPATIENT_CLINIC_OR_DEPARTMENT_OTHER): Payer: Self-pay | Admitting: Physical Therapy

## 2024-01-29 ENCOUNTER — Ambulatory Visit (HOSPITAL_BASED_OUTPATIENT_CLINIC_OR_DEPARTMENT_OTHER): Payer: 59 | Admitting: Physical Therapy

## 2024-01-29 DIAGNOSIS — M25561 Pain in right knee: Secondary | ICD-10-CM | POA: Diagnosis not present

## 2024-01-29 DIAGNOSIS — R262 Difficulty in walking, not elsewhere classified: Secondary | ICD-10-CM | POA: Diagnosis not present

## 2024-01-29 DIAGNOSIS — M6281 Muscle weakness (generalized): Secondary | ICD-10-CM | POA: Diagnosis not present

## 2024-01-29 NOTE — Patient Instructions (Addendum)
 Marland Kitchen

## 2024-02-01 ENCOUNTER — Ambulatory Visit (HOSPITAL_BASED_OUTPATIENT_CLINIC_OR_DEPARTMENT_OTHER): Payer: 59 | Attending: Family | Admitting: Physical Therapy

## 2024-02-01 DIAGNOSIS — M6281 Muscle weakness (generalized): Secondary | ICD-10-CM | POA: Diagnosis not present

## 2024-02-01 DIAGNOSIS — M25561 Pain in right knee: Secondary | ICD-10-CM | POA: Diagnosis not present

## 2024-02-01 DIAGNOSIS — R262 Difficulty in walking, not elsewhere classified: Secondary | ICD-10-CM | POA: Diagnosis not present

## 2024-02-01 NOTE — Therapy (Signed)
 OUTPATIENT PHYSICAL THERAPY TREATMENT   Patient Name: Betty Long JYNWGNF MRN: [id number] DOB:1986-06-30, 38 y.o., female Today's Date: 02/02/2024  END OF SESSION:  PT End of Session - 02/02/24 0827     Visit Number 8    Number of Visits 25    Date for PT Re-Evaluation 03/26/24    Authorization Type MC    PT Start Time 1015    PT Stop Time 1058    PT Time Calculation (min) 43 min    Activity Tolerance Patient tolerated treatment well    Behavior During Therapy WFL for tasks assessed/performed                  Past Medical History:  Diagnosis Date   Family history of breast cancer    Family history of melanoma    History of abnormal cervical Pap smear    ASCUS  ---  NEGATIVE HPV   Mass of left knee    Vaginal Pap smear, abnormal    Past Surgical History:  Procedure Laterality Date   ANTERIOR CRUCIATE LIGAMENT REPAIR Left 2010   COSMETIC SURGERY     laceration repair   KNEE ARTHROSCOPY Left 02/09/2014   Procedure: LEFT KNEE EXCISIONAL BIOPSY;  Surgeon: Eugenia Mcalpine, MD;  Location: [facility];  Service: Orthopedics;  Laterality: Left;   KNEE ARTHROSCOPY W/ LATERAL RELEASE Left 09-16-2011   PATELLA MALTRACKING   KNEE ARTHROSCOPY WITH MEDIAL MENISECTOMY Right 12/24/2023   Procedure: KNEE ARTHROSCOPY WITH MEDIAL MENISECTOMY;  Surgeon: Bjorn Pippin, MD;  Location: [facility];  Service: Orthopedics;  Laterality: Right;   MYRINGOTOMY     3mos 6 mos   TONSILLECTOMY  1993   Patient Active Problem List   Diagnosis Date Noted   Genetic testing 07/07/2021   Family history of breast cancer 06/21/2021   Family history of melanoma 06/21/2021   Indication for care in labor or delivery 10/22/2017   Indication for care in labor and delivery, antepartum 01/26/2015   NSVD (normal spontaneous vaginal delivery) 01/26/2015   S/P knee surgery 02/09/2014   Contraception management 05/12/2012   ASCUS on Pap smear 05/12/2012    REFERRING  PROVIDER:  Bjorn Pippin, MD     REFERRING DIAG: S/P right medial and lateral meniscus repair,ACL reconstruction   Rationale for Evaluation and Treatment: Rehabilitation  THERAPY DIAG:  Acute pain of right knee  Difficulty in walking, not elsewhere classified  Muscle weakness (generalized)  ONSET DATE: DOS 12/24/23   SUBJECTIVE:  SUBJECTIVE STATEMENT: Pt states that MD is happy with progress. She said it goes straight after 15 minutes.     PERTINENT HISTORY:  Lt ACLR and lateral release  PAIN:  Are you having pain? Yes: NPRS scale: 2-3/10 Pain location: Rt knee Pain description: uncomfortable, tight Aggravating factors: constant Relieving factors: ice  PRECAUTIONS:  Other: see WB precautions  RED FLAGS: None   WEIGHT BEARING RESTRICTIONS:  Yes through 2/20: WBAT 0-90, ROM 0-90, no tibial rotation   FALLS:  Has patient fallen in last 6 months? 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  PATIENT SURVEYS:  LEFS 15  COGNITIVE STATUS: Within functional limits for tasks assessed   SENSATION: WFL  EDEMA:  Yes: moderate  GAIT: EVAL: with bil axillary crutches   Body Part #1 Knee  PALPATION: EVAL: Good patellar mobility, no s/s of infection  LOWER EXTREMITY ROM:     Passive  Right eval Right  2/4  3/3  Hip flexion      Hip extension      Hip abduction      Hip adduction      Hip internal rotation      Hip external rotation      Knee flexion 40 58 74 100  Knee extension -3 -2 -4    (Blank rows = not tested)                                                                                                                                TREATMENT DATE:  3/3 Manual:  Passive extension stretching with joint distraction  Grade I and II PA and AP  STM to HS [place] stability taping of the patella   There-ex:  SLR 3x12   Neuro-re-ed   TKE 3x10 red  LAQ with focus on eccentric control 3x10   There-act:  Squat 3x10  Step up 2 inch 2x10      02/28 Manual:  Contract relax into ext, 5 sec holds  STM to HS  There Ex: Recumbent bike with half rotations to tolerance x 5 min Seated with leg into extension with suspension on chair with 15lb weight to push into extension - to tolerance. X2 SL Leg press with no weight Heel raises x15   Neuro re-ed:  TKE yellow 2x10  LAQ with focus on eccentric control.                02/26 Manual:  Passive extension stretching with joint distraction  Grade I and II PA and AP  STM to HS  There Ex: Seated with leg into extension with suspension on chair with 10lb weight to push into extension - to tolerance. X2 Squats to tolerance at counter.  Neuro re-ed:  TKE yellow 2x10  Attempted SLR with quad contraction  LAQ with focus on eccentric control.               Gait:  focus on heel to toe and knee flexion.   2/18 Manual:  Passive extension stretching with joint distraction  Grade I and II PA and AP   There- act  Reviewed squat 0-45 with cuing for technique.     Neuro re-ed:  TKE yellow 2x10                          E-stim: russian: 10/30 to VMO with quad set            X12 min    2/11 Manual:  Passive extension stretching with joint distraction  Grade I and II PA and AP   There-ex:  SL SLR 3x10 review of RPE  SAQ in low range 3x10   Neuro re-ed:  Heel/toe with rock and quad fire 2x19  Standing slow march 3x10                          E-stim: russian: 10/50 with ice to VMO with quad set            X12 min      PATIENT EDUCATION:   Education details: Teacher, music of condition, POC, HEP, exercise form/rationale Person educated: Patient Education method: Explanation, Demonstration, Tactile cues, Verbal cues,  Education comprehension: verbalized understanding, returned demonstration, verbal cues required, tactile cues required, and needs further education  HOME EXERCISE PROGRAM: Access Code: [email or web address] URL: [email or web address]    ASSESSMENT:  CLINICAL IMPRESSION: The patient had more difficulty with open chain exercises today. We trialed taping. It corrected some but she still had difficulty with LAQ. We added in a squat to her HEP and a step up for closed chain strengthening. She tolerated those activity's better. We updated her HEP. We will continue to progress as tolerated. Her extension is improving. Her flexion is still limited.     REHAB POTENTIAL: Good  CLINICAL DECISION MAKING: Stable/uncomplicated  EVALUATION COMPLEXITY: Low   GOALS: Goals reviewed with patient? Yes     SHORT TERM GOALS: goal date 4 wks post op  FWB without AD, able to ambulate household distances pain <=3/10 Baseline: see obj Goal status: INITIAL  2. ROM 0-90 without discomfort Baseline: see obj Goal status: INITIAL  3. Able to demo SLR without quad lag Baseline: see obj Goal status: INITIAL   LONG TERM GOALS: POC DATE   LEFS to at least 36/40 Baseline: see obj Goal status: INITIAL   2.  Able to navigate stairs with proper form Baseline: unable at eval Goal status: INITIAL   3.  MMT hip & knee to age-appropriate levels with hand held dyno Baseline: not appropriate to test at eval Goal status: INITIAL   4.  Single leg balance control, pain <2/10, on stable and unstable surfaces Baseline: unable at eval Goal status: INITIAL   5.  Prepared to return to running & begin gentle plyometric program Baseline: will progress as appropraite Goal status: INITIAL   6.  Able to participate in play with kids  with recognition of precautions Baseline:  Goal status: INITIAL     PLAN:  PT FREQUENCY: 1-2x/week  PT DURATION: 12 weeks  PLANNED INTERVENTIONS: 97164- PT Re-evaluation, 97110-Therapeutic exercises, 97530- Therapeutic activity, 97112- Neuromuscular re-education, 97535- Self Care, 86578- Manual therapy,  16109- Gait training, 60454- Aquatic Therapy, Patient/Family education, Balance training, Stair training, Taping, Dry Needling, Joint mobilization, Spinal mobilization, Scar mobilization, Cryotherapy, and Moist heat.  PLAN FOR NEXT SESSION: per Everardo Pacific Protocol (on Jess's desk)  Royal Hawthorn PT, DPT 02/02/24  8:33 AM   02/02/24 8:33 AM [facility] [address] [place], [place], [address] Phone: [phone]   Fax:  [phone]

## 2024-02-02 ENCOUNTER — Encounter (HOSPITAL_BASED_OUTPATIENT_CLINIC_OR_DEPARTMENT_OTHER): Payer: Self-pay | Admitting: Physical Therapy

## 2024-02-04 ENCOUNTER — Ambulatory Visit (HOSPITAL_BASED_OUTPATIENT_CLINIC_OR_DEPARTMENT_OTHER): Payer: 59 | Admitting: Physical Therapy

## 2024-02-04 ENCOUNTER — Encounter (HOSPITAL_BASED_OUTPATIENT_CLINIC_OR_DEPARTMENT_OTHER): Payer: Self-pay | Admitting: Physical Therapy

## 2024-02-04 DIAGNOSIS — M6281 Muscle weakness (generalized): Secondary | ICD-10-CM | POA: Diagnosis not present

## 2024-02-04 DIAGNOSIS — M25561 Pain in right knee: Secondary | ICD-10-CM | POA: Diagnosis not present

## 2024-02-04 DIAGNOSIS — R262 Difficulty in walking, not elsewhere classified: Secondary | ICD-10-CM | POA: Diagnosis not present

## 2024-02-04 NOTE — Therapy (Signed)
 OUTPATIENT PHYSICAL THERAPY TREATMENT   Patient Name: Betty Long EAVWUJW MRN: [id number] DOB:[birth year], 38 y.o., female Today's Date: 02/04/2024  END OF SESSION:  PT End of Session - 02/04/24 1021     Visit Number 9    Number of Visits 25    Date for PT Re-Evaluation 03/26/24    Authorization Type MC    PT Start Time 1015    PT Stop Time 1057    PT Time Calculation (min) 42 min    Activity Tolerance Patient tolerated treatment well    Behavior During Therapy WFL for tasks assessed/performed                  Past Medical History:  Diagnosis Date   Family history of breast cancer    Family history of melanoma    History of abnormal cervical Pap smear    ASCUS  ---  NEGATIVE HPV   Mass of left knee    Vaginal Pap smear, abnormal    Past Surgical History:  Procedure Laterality Date   ANTERIOR CRUCIATE LIGAMENT REPAIR Left 2010   COSMETIC SURGERY     laceration repair   KNEE ARTHROSCOPY Left 02/09/2014   Procedure: LEFT KNEE EXCISIONAL BIOPSY;  Surgeon: Eugenia Mcalpine, MD;  Location: [facility];  Service: Orthopedics;  Laterality: Left;   KNEE ARTHROSCOPY W/ LATERAL RELEASE Left 09-16-2011   PATELLA MALTRACKING   KNEE ARTHROSCOPY WITH MEDIAL MENISECTOMY Right 12/24/2023   Procedure: KNEE ARTHROSCOPY WITH MEDIAL MENISECTOMY;  Surgeon: Bjorn Pippin, MD;  Location: [facility];  Service: Orthopedics;  Laterality: Right;   MYRINGOTOMY     3mos 6 mos   TONSILLECTOMY  1993   Patient Active Problem List   Diagnosis Date Noted   Genetic testing 07/07/2021   Family history of breast cancer 06/21/2021   Family history of melanoma 06/21/2021   Indication for care in labor or delivery 10/22/2017   Indication for care in labor and delivery, antepartum 01/26/2015   NSVD (normal spontaneous vaginal delivery) 01/26/2015   S/P knee surgery 02/09/2014   Contraception management 05/12/2012   ASCUS on Pap smear 05/12/2012    REFERRING  PROVIDER:  Bjorn Pippin, MD     REFERRING DIAG: S/P right medial and lateral meniscus repair,ACL reconstruction   Rationale for Evaluation and Treatment: Rehabilitation  THERAPY DIAG:  No diagnosis found.  ONSET DATE: DOS 12/24/23   SUBJECTIVE:  SUBJECTIVE STATEMENT: Pt states that MD is happy with progress. She said it goes straight after 15 minutes.     PERTINENT HISTORY:  Lt ACLR and lateral release  PAIN:  Are you having pain? Yes: NPRS scale: 2-3/10 Pain location: Rt knee Pain description: uncomfortable, tight Aggravating factors: constant Relieving factors: ice  PRECAUTIONS:  Other: see WB precautions  RED FLAGS: None   WEIGHT BEARING RESTRICTIONS:  Yes through 2/20: WBAT 0-90, ROM 0-90, no tibial rotation   FALLS:  Has patient fallen in last 6 months? 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  PATIENT SURVEYS:  LEFS 15  COGNITIVE STATUS: Within functional limits for tasks assessed   SENSATION: WFL  EDEMA:  Yes: moderate  GAIT: EVAL: with bil axillary crutches   Body Part #1 Knee  PALPATION: EVAL: Good patellar mobility, no s/s of infection  LOWER EXTREMITY ROM:     Passive  Right eval Right  2/4  3/3 3/6  Hip flexion       Hip extension       Hip abduction       Hip adduction       Hip internal rotation       Hip external rotation       Knee flexion 40 58 74 100 106  Knee extension -3 -2 -4  3   (Blank rows = not tested)                                                                                                                                TREATMENT DATE:  3/6 Manual:  Passive extension stretching with joint distraction  Grade I and II PA and AP  STM to HS  There-ex:  SLR 3x12  SAQ 3x12    Neuro-re-ed   TKE 3x10 red  Heel/toe x15   There-act:  Squat 3x10   3/3 Manual:  Passive extension stretching with joint distraction  Grade I and II PA and AP  STM to HS [place] stability taping of the patella   There-ex:  SLR 3x12   Neuro-re-ed   TKE 3x10 red  LAQ with focus on eccentric control 3x10   There-act:  Squat 3x10  Step up 2 inch 2x10      02/28 Manual:  Contract relax into ext, 5 sec holds  STM to HS  There Ex: Recumbent bike with half rotations to tolerance x 5 min Seated with leg into extension with suspension on chair with 15lb weight to push into extension - to tolerance. X2 SL Leg press with no weight Heel raises x15   Neuro re-ed:  TKE yellow 2x10  LAQ with focus on eccentric control.  PATIENT EDUCATION:  Education details: Teacher, music of condition, POC, HEP, exercise form/rationale Person educated: Patient Education method: Explanation, Demonstration, Tactile cues, Verbal cues,  Education comprehension: verbalized understanding, returned demonstration, verbal cues required, tactile cues required, and needs further education  HOME EXERCISE PROGRAM: Access Code: [email or web address] URL: [email or web address]    ASSESSMENT:  CLINICAL IMPRESSION: The patient had less catching today in her knee cap. She will take the tape off. We were able to do SAQ. Her ROm has come along well. She is making good progress.   REHAB POTENTIAL: Good  CLINICAL DECISION MAKING: Stable/uncomplicated  EVALUATION COMPLEXITY: Low   GOALS: Goals reviewed with patient? Yes     SHORT TERM GOALS: goal date 4 wks post op  FWB without AD, able to ambulate household distances pain <=3/10 Baseline: see obj Goal status: INITIAL  2.  ROM 0-90 without discomfort Baseline: see obj Goal status: INITIAL  3. Able to demo SLR without quad lag Baseline: see obj Goal status: INITIAL   LONG TERM GOALS: POC DATE   LEFS to at least 36/40 Baseline: see obj Goal status: INITIAL   2.  Able to navigate stairs with proper form Baseline: unable at eval Goal status: INITIAL   3.  MMT hip & knee to age-appropriate levels with hand held dyno Baseline: not appropriate to test at eval Goal status: INITIAL   4.  Single leg balance control, pain <2/10, on stable and unstable surfaces Baseline: unable at eval Goal status: INITIAL   5.  Prepared to return to running & begin gentle plyometric program Baseline: will progress as appropraite Goal status: INITIAL   6.  Able to participate in play with kids with recognition of precautions Baseline:  Goal status: INITIAL     PLAN:  PT FREQUENCY: 1-2x/week  PT DURATION: 12 weeks  PLANNED INTERVENTIONS: 97164- PT Re-evaluation, 97110-Therapeutic exercises, 97530- Therapeutic activity, 97112- Neuromuscular re-education, 97535- Self Care, 16109- Manual therapy, [phone]- Gait training, [phone]- Aquatic Therapy, Patient/Family education, Balance training, Stair training, Taping, Dry Needling, Joint mobilization, Spinal mobilization, Scar mobilization, Cryotherapy, and Moist heat.  PLAN FOR NEXT SESSION: per Everardo Pacific Protocol (on Jess's desk)  Royal Hawthorn PT, DPT 02/04/24  11:28 AM   02/04/24 11:28 AM [facility] [address] [place], [place], [address] Phone: [phone]   Fax:  [phone]

## 2024-02-05 ENCOUNTER — Encounter (HOSPITAL_BASED_OUTPATIENT_CLINIC_OR_DEPARTMENT_OTHER): Payer: Self-pay | Admitting: Physical Therapy

## 2024-02-08 ENCOUNTER — Ambulatory Visit (HOSPITAL_BASED_OUTPATIENT_CLINIC_OR_DEPARTMENT_OTHER): Payer: 59 | Admitting: Physical Therapy

## 2024-02-08 ENCOUNTER — Encounter (HOSPITAL_BASED_OUTPATIENT_CLINIC_OR_DEPARTMENT_OTHER): Payer: Self-pay | Admitting: Physical Therapy

## 2024-02-08 DIAGNOSIS — M6281 Muscle weakness (generalized): Secondary | ICD-10-CM | POA: Diagnosis not present

## 2024-02-08 DIAGNOSIS — M25561 Pain in right knee: Secondary | ICD-10-CM | POA: Diagnosis not present

## 2024-02-08 DIAGNOSIS — R262 Difficulty in walking, not elsewhere classified: Secondary | ICD-10-CM | POA: Diagnosis not present

## 2024-02-08 NOTE — Therapy (Unsigned)
 OUTPATIENT PHYSICAL THERAPY TREATMENT   Patient Name: Betty Long XBJYNWG MRN: [id number] DOB:Feb 10, 1986, 38 y.o., female Today's Date: 02/09/2024  END OF SESSION:  PT End of Session - 02/08/24 1034     Visit Number 10    Number of Visits 25    Date for PT Re-Evaluation 03/26/24    Authorization Type [facility]    PT Start Time 1015    PT Stop Time 1113    PT Time Calculation (min) 58 min    Activity Tolerance Patient tolerated treatment well    Behavior During Therapy WFL for tasks assessed/performed                  Past Medical History:  Diagnosis Date   Family history of breast cancer    Family history of melanoma    History of abnormal cervical Pap smear    ASCUS  ---  NEGATIVE HPV   Mass of left knee    Vaginal Pap smear, abnormal    Past Surgical History:  Procedure Laterality Date   ANTERIOR CRUCIATE LIGAMENT REPAIR Left 2010   COSMETIC SURGERY     laceration repair   KNEE ARTHROSCOPY Left 02/09/2014   Procedure: LEFT KNEE EXCISIONAL BIOPSY;  Surgeon: Eugenia Mcalpine, MD;  Location: [facility];  Service: Orthopedics;  Laterality: Left;   KNEE ARTHROSCOPY W/ LATERAL RELEASE Left 09-16-2011   PATELLA MALTRACKING   KNEE ARTHROSCOPY WITH MEDIAL MENISECTOMY Right 12/24/2023   Procedure: KNEE ARTHROSCOPY WITH MEDIAL MENISECTOMY;  Surgeon: Bjorn Pippin, MD;  Location: [facility];  Service: Orthopedics;  Laterality: Right;   MYRINGOTOMY     3mos 6 mos   TONSILLECTOMY  1993   Patient Active Problem List   Diagnosis Date Noted   Genetic testing 07/07/2021   Family history of breast cancer 06/21/2021   Family history of melanoma 06/21/2021   Indication for care in labor or delivery 10/22/2017   Indication for care in labor and delivery, antepartum 01/26/2015   NSVD (normal spontaneous vaginal delivery) 01/26/2015   S/P knee surgery 02/09/2014   Contraception management 05/12/2012   ASCUS on Pap smear 05/12/2012    REFERRING  PROVIDER:  Bjorn Pippin, MD     REFERRING DIAG: S/P right medial and lateral meniscus repair,ACL reconstruction   Rationale for Evaluation and Treatment: Rehabilitation  THERAPY DIAG:  Acute pain of right knee  Difficulty in walking, not elsewhere classified  Muscle weakness (generalized)  ONSET DATE: DOS 12/24/23   SUBJECTIVE:  SUBJECTIVE STATEMENT: The patient went back to work. She had a significant increase in seelling and pain. When the tape came off it started catching again.    PERTINENT HISTORY:  Lt ACLR and lateral release  PAIN:  Are you having pain? Yes: NPRS scale: 2-3/10 Pain location: Rt knee Pain description: uncomfortable, tight Aggravating factors: constant Relieving factors: ice  PRECAUTIONS:  Other: see WB precautions  RED FLAGS: None   WEIGHT BEARING RESTRICTIONS:  Yes through 2/20: WBAT 0-90, ROM 0-90, no tibial rotation   FALLS:  Has patient fallen in last 6 months? 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  PATIENT SURVEYS:  LEFS 15  COGNITIVE STATUS: Within functional limits for tasks assessed   SENSATION: WFL  EDEMA:  Yes: moderate  GAIT: EVAL: with bil axillary crutches   Body Part #1 Knee  PALPATION: EVAL: Good patellar mobility, no s/s of infection  LOWER EXTREMITY ROM:     Passive  Right eval Right  2/4  3/3 3/6 3/10  Hip flexion        Hip extension        Hip abduction        Hip adduction        Hip internal rotation        Hip external rotation        Knee flexion 40 58 74 100 106  103   Knee extension -3 -2 -4  3 3     (Blank rows = not tested)                                                                                                                               TREATMENT DATE:  3/10 Manual:  Passive extension stretching with joint distraction  Grade I and II PA and AP  STM to HS  There-ex:  SLR 3x12  SL SLR 3x12  Neuro-re -ed   Hell/toe rock 2x12  Slow march 2x12   Vaso 15 min 32 degrees medium pressure      3/6 Manual:  Passive extension stretching with joint distraction  Grade I and II PA and AP  STM to HS  There-ex:  SLR 3x12  SAQ 3x12    Neuro-re-ed   TKE 3x10 red  Heel/toe x15   There-act:  Squat 3x10   3/3 Manual:  Passive extension stretching with joint distraction  Grade I and II PA and AP  STM to HS [place] stability taping of the patella   There-ex:  SLR 3x12   Neuro-re-ed   TKE 3x10 red  LAQ with focus on eccentric control 3x10   There-act:  Squat 3x10  Step up 2 inch 2x10      02/28 Manual:  Contract relax  into ext, 5 sec holds  STM to HS  There Ex: Recumbent bike with half rotations to tolerance x 5 min Seated with leg into extension with suspension on chair with 15lb weight to push into extension - to tolerance. X2 SL Leg press with no weight Heel raises x15   Neuro re-ed:  TKE yellow 2x10  LAQ with focus on eccentric control.                   PATIENT EDUCATION:  Education details: Teacher, music of condition, POC, HEP, exercise form/rationale Person educated: Patient Education method: Explanation, Demonstration, Tactile cues, Verbal cues,  Education comprehension: verbalized understanding, returned demonstration, verbal cues required, tactile cues required, and needs further education  HOME EXERCISE PROGRAM: Access Code: [email or web address] URL: [email or web address]    ASSESSMENT:  CLINICAL IMPRESSION: Therapy reviewed self taping with the patient today. She did well> She  was able to tape herself with minimal cuing. She was given a roll of cover roll and advised where to get it. She had a mild increase in difficulty with her exercises today. She had more swelling and pain prior to treatment. Her range has maintained despite the increased edema in her knee.We performed a trial of vaso-pneumatic device to reduce inflammation. She reported that that felt very good following treatment. Therapy will continue to progress as tolerated.   REHAB POTENTIAL: Good  CLINICAL DECISION MAKING: Stable/uncomplicated  EVALUATION COMPLEXITY: Low   GOALS: Goals reviewed with patient? Yes     SHORT TERM GOALS: goal date 4 wks post op  FWB without AD, able to ambulate household distances pain <=3/10 Baseline: see obj Goal status: INITIAL  2. ROM 0-90 without discomfort Baseline: see obj Goal status: INITIAL  3. Able to demo SLR without quad lag Baseline: see obj Goal status: INITIAL   LONG TERM GOALS: POC DATE   LEFS to at least 36/40 Baseline: see obj Goal status: INITIAL   2.  Able to navigate stairs with proper form Baseline: unable at eval Goal status: INITIAL   3.  MMT hip & knee to age-appropriate levels with hand held dyno Baseline: not appropriate to test at eval Goal status: INITIAL   4.  Single leg balance control, pain <2/10, on stable and unstable surfaces Baseline: unable at eval Goal status: INITIAL   5.  Prepared to return to running & begin gentle plyometric program Baseline: will progress as appropraite Goal status: INITIAL   6.  Able to participate in play with kids with recognition of precautions Baseline:  Goal status: INITIAL     PLAN:  PT FREQUENCY: 1-2x/week  PT DURATION: 12 weeks  PLANNED INTERVENTIONS: 97164- PT Re-evaluation, 97110-Therapeutic exercises, 97530- Therapeutic activity, 97112- Neuromuscular re-education, 97535- Self Care, 16109- Manual therapy, [phone]- Gait training, [phone]- Aquatic Therapy, Patient/Family  education, Balance training, Stair training, Taping, Dry Needling, Joint mobilization, Spinal mobilization, Scar mobilization, Cryotherapy, and Moist heat.  PLAN FOR NEXT SESSION: per Everardo Pacific Protocol (on Jess's desk)  Royal Hawthorn PT, DPT 02/09/24  10:43 AM   02/09/24 10:43 AM [facility] [address] [place], [place], [address] Phone: [phone]   Fax:  [phone]

## 2024-02-11 ENCOUNTER — Encounter (HOSPITAL_BASED_OUTPATIENT_CLINIC_OR_DEPARTMENT_OTHER): Payer: Self-pay | Admitting: Physical Therapy

## 2024-02-11 ENCOUNTER — Ambulatory Visit (HOSPITAL_BASED_OUTPATIENT_CLINIC_OR_DEPARTMENT_OTHER): Payer: 59 | Admitting: Physical Therapy

## 2024-02-11 DIAGNOSIS — R262 Difficulty in walking, not elsewhere classified: Secondary | ICD-10-CM

## 2024-02-11 DIAGNOSIS — M6281 Muscle weakness (generalized): Secondary | ICD-10-CM | POA: Diagnosis not present

## 2024-02-11 DIAGNOSIS — M25561 Pain in right knee: Secondary | ICD-10-CM | POA: Diagnosis not present

## 2024-02-11 NOTE — Therapy (Signed)
 OUTPATIENT PHYSICAL THERAPY TREATMENT   Patient Name: Betty Long ZOXWRUE MRN: [id number] DOB:10/26/1986, 38 y.o., female Today's Date: 02/12/2024  END OF SESSION:  PT End of Session - 02/11/24 1035     Visit Number 11    Number of Visits 25    Date for PT Re-Evaluation 03/26/24    PT Start Time 1015    PT Stop Time 1058    PT Time Calculation (min) 43 min    Activity Tolerance Patient tolerated treatment well    Behavior During Therapy WFL for tasks assessed/performed                  Past Medical History:  Diagnosis Date   Family history of breast cancer    Family history of melanoma    History of abnormal cervical Pap smear    ASCUS  ---  NEGATIVE HPV   Mass of left knee    Vaginal Pap smear, abnormal    Past Surgical History:  Procedure Laterality Date   ANTERIOR CRUCIATE LIGAMENT REPAIR Left 2010   COSMETIC SURGERY     laceration repair   KNEE ARTHROSCOPY Left 02/09/2014   Procedure: LEFT KNEE EXCISIONAL BIOPSY;  Surgeon: Eugenia Mcalpine, MD;  Location: [facility] [place];  Service: Orthopedics;  Laterality: Left;   KNEE ARTHROSCOPY W/ LATERAL RELEASE Left 09-16-2011   PATELLA MALTRACKING   KNEE ARTHROSCOPY WITH MEDIAL MENISECTOMY Right 12/24/2023   Procedure: KNEE ARTHROSCOPY WITH MEDIAL MENISECTOMY;  Surgeon: Bjorn Pippin, MD;  Location: [facility];  Service: Orthopedics;  Laterality: Right;   MYRINGOTOMY     3mos 6 mos   TONSILLECTOMY  1993   Patient Active Problem List   Diagnosis Date Noted   Genetic testing 07/07/2021   Family history of breast cancer 06/21/2021   Family history of melanoma 06/21/2021   Indication for care in labor or delivery 10/22/2017   Indication for care in labor and delivery, antepartum 01/26/2015   NSVD (normal spontaneous vaginal delivery) 01/26/2015   S/P knee surgery 02/09/2014   Contraception management 05/12/2012   ASCUS on Pap smear 05/12/2012    REFERRING PROVIDER:  Bjorn Pippin, MD      REFERRING DIAG: S/P right medial and lateral meniscus repair,ACL reconstruction   Rationale for Evaluation and Treatment: Rehabilitation  THERAPY DIAG:  Acute pain of right knee  Difficulty in walking, not elsewhere classified  Muscle weakness (generalized)  ONSET DATE: DOS 12/24/23   SUBJECTIVE:  SUBJECTIVE STATEMENT: The patient went back to work. She had a significant increase in seelling and pain. When the tape came off it started catching again.    PERTINENT HISTORY:  Lt ACLR and lateral release  PAIN:  Are you having pain? Yes: NPRS scale: 2-3/10 Pain location: Rt knee Pain description: uncomfortable, tight Aggravating factors: constant Relieving factors: ice  PRECAUTIONS:  Other: see WB precautions  RED FLAGS: None   WEIGHT BEARING RESTRICTIONS:  Yes through 2/20: WBAT 0-90, ROM 0-90, no tibial rotation   FALLS:  Has patient fallen in last 6 months? 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  PATIENT SURVEYS:  LEFS 15  COGNITIVE STATUS: Within functional limits for tasks assessed   SENSATION: WFL  EDEMA:  Yes: moderate  GAIT: EVAL: with bil axillary crutches   Body Part #1 Knee  PALPATION: EVAL: Good patellar mobility, no s/s of infection  LOWER EXTREMITY ROM:     Passive  Right eval Right  2/4  3/3 3/6 3/10  Hip flexion        Hip extension        Hip abduction        Hip adduction        Hip internal rotation        Hip external rotation        Knee flexion 40 58 74 100 106 103   Knee extension -3 -2 -4   3 3     (Blank rows = not tested)                                                                                                                               TREATMENT DATE:  3/14 Manual:  Passive extension stretching with joint distraction  Grade I and II PA and AP  STM to HS   There-ex:  SLR 3x12  SAQ 2x12  Exercise bike able to do complete pleat revolutions towards the end.  5 minutes low resistance Leg press Cybex to legs 1 x 12 50 pounds RPE of 2.  Patient requested to switch to 1 leg.  2 x 1250 pounds RPE of 5.  Neuro-re -ed   Hell/toe rock 2x12  Slow march 2x12   3/10 Manual:  Passive extension stretching with joint distraction  Grade I and II PA and AP  STM to HS  There-ex:  SLR 3x12  SL SLR 3x12  Neuro-re -ed   Hell/toe rock 2x12  Slow march 2x12   Vaso 15 min 32 degrees medium pressure      3/6 Manual:  Passive extension stretching with joint distraction  Grade I and II PA and AP  STM to HS  There-ex:  SLR 3x12  SAQ 3x12  Neuro-re-ed   TKE 3x10 red  Heel/toe x15   There-act:  Squat 3x10   3/3 Manual:  Passive extension stretching with joint distraction  Grade I and II PA and AP  STM to HS [place] stability taping of the patella   There-ex:  SLR 3x12   Neuro-re-ed   TKE 3x10 red  LAQ with focus on eccentric control 3x10   There-act:  Squat 3x10  Step up 2 inch 2x10      02/28 Manual:  Contract relax into ext, 5 sec holds  STM to HS  There Ex: Recumbent bike with half rotations to tolerance x 5 min Seated with leg into extension with suspension on chair with 15lb weight to push into extension - to tolerance. X2 SL Leg press with no weight Heel raises x15   Neuro re-ed:  TKE yellow 2x10  LAQ with focus on eccentric control.                   PATIENT EDUCATION:  Education details: Teacher, music of condition, POC, HEP, exercise form/rationale Person educated: Patient Education method: Explanation,  Demonstration, Tactile cues, Verbal cues,  Education comprehension: verbalized understanding, returned demonstration, verbal cues required, tactile cues required, and needs further education  HOME EXERCISE PROGRAM: Access Code: [email or web address] URL: [email or web address]    ASSESSMENT:  CLINICAL IMPRESSION: Therapy continues to work on flexion and extension. They are both improving. Her total range was 4-105 today. We were also able to add the leg press today. She tolerated well.  Continue to encourage her work on her range of motion at home.  She is also encouraged to continue working on her quad strengthening at home.  She will return to work next week.  See below for goal specific progress  REHAB POTENTIAL: Good  CLINICAL DECISION MAKING: Stable/uncomplicated  EVALUATION COMPLEXITY: Low   GOALS: Goals reviewed with patient? Yes     SHORT TERM GOALS: goal date 4 wks post op  FWB without AD, able to ambulate household distances pain <=3/10 Baseline: see obj Goal status: Less pain walking around the house 3/14 2. ROM 0-90 without discomfort Baseline: Continues to have extension limitations 3/14 Goal status: INITIAL  3. Able to demo SLR without quad lag Baseline: see obj Goal status: Mild quad leg remains 3/14  LONG TERM GOALS: POC DATE   LEFS to at least 36/40 Baseline: see obj Goal status: INITIAL   2.  Able to navigate stairs with proper form Baseline: unable at eval Goal status: INITIAL   3.  MMT hip & knee to age-appropriate levels with hand held dyno Baseline: not appropriate to test at eval Goal status: INITIAL   4.  Single leg balance control, pain <2/10, on stable and unstable surfaces Baseline: unable at eval Goal status: INITIAL   5.  Prepared to return to running & begin gentle plyometric program Baseline: will progress as appropraite Goal status: INITIAL   6.  Able to participate in play with kids with recognition of  precautions Baseline:  Goal status: INITIAL     PLAN:  PT FREQUENCY: 1-2x/week  PT DURATION: 12 weeks  PLANNED INTERVENTIONS: 97164- PT Re-evaluation, 97110-Therapeutic exercises, 97530- Therapeutic activity, 97112- Neuromuscular re-education, 97535- Self Care, 16109- Manual therapy, [phone]- Gait training, [phone]- Aquatic Therapy, Patient/Family education, Balance training, Stair training, Taping, Dry Needling, Joint mobilization, Spinal mobilization, Scar mobilization, Cryotherapy, and Moist heat.  PLAN FOR NEXT SESSION: per Everardo Pacific Protocol (on Jess's desk) Lorayne Bender PT DPT 02/12/24  11:44 AM  02/12/24 11:44 AM [facility] [address] [place], [place], [address] Phone: [phone]   Fax:  [phone]

## 2024-02-12 ENCOUNTER — Encounter (HOSPITAL_BASED_OUTPATIENT_CLINIC_OR_DEPARTMENT_OTHER): Payer: Self-pay | Admitting: Physical Therapy

## 2024-02-15 ENCOUNTER — Encounter (HOSPITAL_BASED_OUTPATIENT_CLINIC_OR_DEPARTMENT_OTHER): Payer: Self-pay | Admitting: Physical Therapy

## 2024-02-15 ENCOUNTER — Ambulatory Visit (HOSPITAL_BASED_OUTPATIENT_CLINIC_OR_DEPARTMENT_OTHER): Payer: 59 | Admitting: Physical Therapy

## 2024-02-17 ENCOUNTER — Encounter (HOSPITAL_BASED_OUTPATIENT_CLINIC_OR_DEPARTMENT_OTHER): Payer: Self-pay | Admitting: Physical Therapy

## 2024-02-18 ENCOUNTER — Ambulatory Visit (HOSPITAL_BASED_OUTPATIENT_CLINIC_OR_DEPARTMENT_OTHER): Payer: 59 | Admitting: Physical Therapy

## 2024-02-18 ENCOUNTER — Encounter (HOSPITAL_BASED_OUTPATIENT_CLINIC_OR_DEPARTMENT_OTHER): Payer: Self-pay | Admitting: Physical Therapy

## 2024-02-18 DIAGNOSIS — M6281 Muscle weakness (generalized): Secondary | ICD-10-CM

## 2024-02-18 DIAGNOSIS — M25561 Pain in right knee: Secondary | ICD-10-CM | POA: Diagnosis not present

## 2024-02-18 DIAGNOSIS — R262 Difficulty in walking, not elsewhere classified: Secondary | ICD-10-CM

## 2024-02-19 ENCOUNTER — Encounter (HOSPITAL_BASED_OUTPATIENT_CLINIC_OR_DEPARTMENT_OTHER): Payer: Self-pay | Admitting: Physical Therapy

## 2024-02-19 NOTE — Therapy (Signed)
 OUTPATIENT PHYSICAL THERAPY TREATMENT   Patient Name: Betty Long MRN: [id number] DOB:May 25, 1986, 38 y.o., female Today's Date: 02/19/2024  END OF SESSION:  PT End of Session - 02/18/24 2115     Visit Number 12    Number of Visits 25    Date for PT Re-Evaluation 03/26/24    PT Start Time 0930    PT Stop Time 1027    PT Time Calculation (min) 57 min    Activity Tolerance Patient tolerated treatment well    Behavior During Therapy WFL for tasks assessed/performed                  Past Medical History:  Diagnosis Date   Family history of breast cancer    Family history of melanoma    History of abnormal cervical Pap smear    ASCUS  ---  NEGATIVE HPV   Mass of left knee    Vaginal Pap smear, abnormal    Past Surgical History:  Procedure Laterality Date   ANTERIOR CRUCIATE LIGAMENT REPAIR Left 2010   COSMETIC SURGERY     laceration repair   KNEE ARTHROSCOPY Left 02/09/2014   Procedure: LEFT KNEE EXCISIONAL BIOPSY;  Surgeon: Eugenia Mcalpine, MD;  Location: [facility];  Service: Orthopedics;  Laterality: Left;   KNEE ARTHROSCOPY W/ LATERAL RELEASE Left 09-16-2011   PATELLA MALTRACKING   KNEE ARTHROSCOPY WITH MEDIAL MENISECTOMY Right 12/24/2023   Procedure: KNEE ARTHROSCOPY WITH MEDIAL MENISECTOMY;  Surgeon: Bjorn Pippin, MD;  Location: [facility];  Service: Orthopedics;  Laterality: Right;   MYRINGOTOMY     3mos 6 mos   TONSILLECTOMY  1993   Patient Active Problem List   Diagnosis Date Noted   Genetic testing 07/07/2021   Family history of breast cancer 06/21/2021   Family history of melanoma 06/21/2021   Indication for care in labor or delivery 10/22/2017   Indication for care in labor and delivery, antepartum 01/26/2015   NSVD (normal spontaneous vaginal delivery) 01/26/2015   S/P knee surgery 02/09/2014   Contraception management 05/12/2012   ASCUS on Pap smear 05/12/2012    REFERRING PROVIDER:  Bjorn Pippin, MD      REFERRING DIAG: S/P right medial and lateral meniscus repair,ACL reconstruction   Rationale for Evaluation and Treatment: Rehabilitation  THERAPY DIAG:  Acute pain of right knee  Difficulty in walking, not elsewhere classified  Muscle weakness (generalized)  ONSET DATE: DOS 12/24/23   SUBJECTIVE:  SUBJECTIVE STATEMENT: The patient worked last night. She feels like it is swollen and stiff. She has been working on her exercises as much as she can. She sees the MD tomorrow.   PERTINENT HISTORY:  Lt ACLR and lateral release  PAIN:  Are you having pain? Yes: NPRS scale: 2-3/10 Pain location: Rt knee Pain description: uncomfortable, tight Aggravating factors: constant Relieving factors: ice  PRECAUTIONS:  Other: see WB precautions  RED FLAGS: None   WEIGHT BEARING RESTRICTIONS:  Yes through 2/20: WBAT 0-90, ROM 0-90, no tibial rotation   FALLS:  Has patient fallen in last 6 months? 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  PATIENT SURVEYS:  LEFS 15  COGNITIVE STATUS: Within functional limits for tasks assessed   SENSATION: WFL  EDEMA:  Yes: moderate  GAIT: EVAL: with bil axillary crutches   Body Part #1 Knee  PALPATION: EVAL: Good patellar mobility, no s/s of infection  LOWER EXTREMITY ROM:     Passive  Right eval Right  2/4  3/3 3/6 3/10  Hip flexion        Hip extension        Hip abduction        Hip adduction        Hip internal rotation        Hip external rotation        Knee flexion 40 58 74 100 106 103   Knee  extension -3 -2 -4  3 3     (Blank rows = not tested)                                                                                                                               TREATMENT DATE:  Manual:  Passive extension stretching with joint distraction  Grade 2 and III PA and AP  STM to HS Edema massage  Reviewed taping technique. Taped patients knee medially    There-ex:  SLR 3x12  SL SLR 3x12 Nu-step for ROM 5 in    Vaso 15 min 32 degrees medium pressure    3/14 Manual:  Passive extension stretching with joint distraction  Grade I and II PA and AP  STM to HS   There-ex:  SLR 3x12  SAQ 2x12  Exercise bike able to do complete pleat revolutions towards the end.  5 minutes low resistance Leg press Cybex to legs 1 x 12 50 pounds RPE of 2.  Patient requested to switch to 1 leg.  2 x 1250 pounds RPE of 5.  Neuro-re -ed   Hell/toe rock 2x12  Slow march 2x12   3/10 Manual:  Passive extension stretching with joint distraction  Grade I and II PA and AP  STM to HS  There-ex:  SLR 3x12  SL SLR 3x12  Neuro-re -ed   Hell/toe rock 2x12  Slow march 2x12   Vaso 15 min 32 degrees medium pressure      3/6 Manual:  Passive extension stretching with joint distraction  Grade I and II PA and AP  STM to HS  There-ex:  SLR 3x12  SAQ 3x12    Neuro-re-ed   TKE 3x10 red  Heel/toe x15   There-act:  Squat 3x10   3/3 Manual:  Passive extension stretching with joint distraction  Grade I and II PA and AP  STM to HS [place] stability taping of the patella   There-ex:  SLR 3x12   Neuro-re-ed   TKE 3x10 red  LAQ with focus on eccentric control 3x10   There-act:  Squat 3x10  Step up 2 inch 2x10      02/28 Manual:  Contract relax into ext, 5 sec holds  STM to HS  There Ex: Recumbent bike with half rotations to tolerance x 5 min Seated with leg into extension with suspension on chair with 15lb weight to push into extension - to tolerance.  X2 SL Leg press with no weight Heel raises x15   Neuro re-ed:  TKE yellow 2x10  LAQ with focus on eccentric control.                   PATIENT EDUCATION:  Education details: Teacher, music of condition, POC, HEP, exercise form/rationale Person educated: Patient Education method: Explanation, Demonstration, Tactile cues, Verbal cues,  Education comprehension: verbalized understanding, returned demonstration, verbal cues required, tactile cues required, and needs further education  HOME EXERCISE PROGRAM: Access Code: [email or web address] URL: [email or web address]    ASSESSMENT:  CLINICAL IMPRESSION:  Total Arc of motion was measured at 3-107 today. She reported more stiffness and swelling. She had a significant improvement in motion with manual therapy. We will begin BFR next visit. Next week she doesn't have work.   REHAB POTENTIAL: Good  CLINICAL DECISION MAKING: Stable/uncomplicated  EVALUATION COMPLEXITY: Low   GOALS: Goals reviewed with patient? Yes     SHORT TERM GOALS: goal date 4 wks post op  FWB without AD, able to ambulate household distances pain <=3/10 Baseline: see obj Goal status: Less pain walking around the house 3/14 2. ROM 0-90 without discomfort Baseline: Continues to have extension limitations 3/14 Goal status: INITIAL  3. Able to demo SLR without quad lag Baseline: see obj Goal status: Mild quad leg remains 3/14  LONG TERM GOALS: POC DATE   LEFS to at least 36/40 Baseline: see obj Goal status: INITIAL   2.  Able to navigate stairs with proper form Baseline: unable at eval Goal status: INITIAL   3.  MMT hip & knee to age-appropriate levels with hand held dyno Baseline: not appropriate to test at eval Goal status: INITIAL   4.  Single leg balance control, pain <2/10, on stable and unstable surfaces Baseline: unable at eval Goal status: INITIAL   5.  Prepared to return to running & begin gentle plyometric program Baseline: will  progress as appropraite Goal status: INITIAL   6.  Able to participate in play with kids with recognition of precautions Baseline:  Goal status: INITIAL     PLAN:  PT FREQUENCY: 1-2x/week  PT DURATION: 12 weeks  PLANNED INTERVENTIONS: 97164- PT Re-evaluation, 97110-Therapeutic exercises, 97530- Therapeutic activity, [id number]- Neuromuscular re-education, 97535- Self Care, 51884- Manual therapy, [phone]- Gait training, [phone]- Aquatic Therapy, Patient/Family education, Balance training, Stair training, Taping, Dry  Needling, Joint mobilization, Spinal mobilization, Scar mobilization, Cryotherapy, and Moist heat.  PLAN FOR NEXT SESSION: per Everardo Pacific Protocol (on Jess's desk)     Lorayne Bender PT DPT  02/19/24 7:58 AM [facility] [address] [place], [place], [address] Phone: [phone]   Fax:  [phone]

## 2024-02-22 ENCOUNTER — Ambulatory Visit (HOSPITAL_BASED_OUTPATIENT_CLINIC_OR_DEPARTMENT_OTHER): Payer: 59 | Admitting: Physical Therapy

## 2024-02-22 DIAGNOSIS — R262 Difficulty in walking, not elsewhere classified: Secondary | ICD-10-CM

## 2024-02-22 DIAGNOSIS — M25561 Pain in right knee: Secondary | ICD-10-CM | POA: Diagnosis not present

## 2024-02-22 DIAGNOSIS — M6281 Muscle weakness (generalized): Secondary | ICD-10-CM | POA: Diagnosis not present

## 2024-02-22 NOTE — Therapy (Unsigned)
 OUTPATIENT PHYSICAL THERAPY TREATMENT   Patient Name: Betty Long ZOXWRUE MRN: [id number] DOB:[birth year], 38 y.o., female Today's Date: 02/23/2024  END OF SESSION:  PT End of Session - 02/23/24 1007     Visit Number 13    Number of Visits 25    Date for PT Re-Evaluation 03/26/24    PT Start Time 1015    PT Stop Time 1058    PT Time Calculation (min) 43 min    Activity Tolerance Patient tolerated treatment well    Behavior During Therapy WFL for tasks assessed/performed                   Past Medical History:  Diagnosis Date   Family history of breast cancer    Family history of melanoma    History of abnormal cervical Pap smear    ASCUS  ---  NEGATIVE HPV   Mass of left knee    Vaginal Pap smear, abnormal    Past Surgical History:  Procedure Laterality Date   ANTERIOR CRUCIATE LIGAMENT REPAIR Left 2010   COSMETIC SURGERY     laceration repair   KNEE ARTHROSCOPY Left 02/09/2014   Procedure: LEFT KNEE EXCISIONAL BIOPSY;  Surgeon: Eugenia Mcalpine, MD;  Location: [facility];  Service: Orthopedics;  Laterality: Left;   KNEE ARTHROSCOPY W/ LATERAL RELEASE Left 09-16-2011   PATELLA MALTRACKING   KNEE ARTHROSCOPY WITH MEDIAL MENISECTOMY Right 12/24/2023   Procedure: KNEE ARTHROSCOPY WITH MEDIAL MENISECTOMY;  Surgeon: Bjorn Pippin, MD;  Location: [facility];  Service: Orthopedics;  Laterality: Right;   MYRINGOTOMY     3mos 6 mos   TONSILLECTOMY  1993   Patient Active Problem List   Diagnosis Date Noted   Genetic testing 07/07/2021   Family history of breast cancer 06/21/2021   Family history of melanoma 06/21/2021   Indication for care in labor or delivery 10/22/2017   Indication for care in labor and delivery, antepartum 01/26/2015   NSVD (normal spontaneous vaginal delivery) 01/26/2015   S/P knee surgery 02/09/2014   Contraception management 05/12/2012   ASCUS on Pap smear 05/12/2012    REFERRING PROVIDER:  Bjorn Pippin,  MD     REFERRING DIAG: S/P right medial and lateral meniscus repair,ACL reconstruction   Rationale for Evaluation and Treatment: Rehabilitation  THERAPY DIAG:  Acute pain of right knee  Difficulty in walking, not elsewhere classified  Muscle weakness (generalized)  ONSET DATE: DOS 12/24/23   SUBJECTIVE:  SUBJECTIVE STATEMENT: 3/24: Pt got injection since last visit. [place] Spaniel it was very painful a couple days after but now it feels fine. Working last night was an 8/10 pain but is better now.    The patient worked last night. She feels like it is swollen and stiff. She has been working on her exercises as much as she can. She sees the MD tomorrow.   PERTINENT HISTORY:  Lt ACLR and lateral release  PAIN:  Are you having pain? Yes: NPRS scale: 2-3/10 Pain location: Rt knee Pain description: uncomfortable, tight Aggravating factors: constant Relieving factors: ice  PRECAUTIONS:  Other: see WB precautions  RED FLAGS: None   WEIGHT BEARING RESTRICTIONS:  Yes through 2/20: WBAT 0-90, ROM 0-90, no tibial rotation   FALLS:  Has patient fallen in last 6 months? 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  PATIENT SURVEYS:  LEFS 15  COGNITIVE STATUS: Within functional limits for tasks assessed   SENSATION: WFL  EDEMA:  Yes: moderate  GAIT: EVAL: with bil axillary crutches   Body Part #1 Knee  PALPATION: EVAL: Good patellar mobility, no s/s of infection  LOWER EXTREMITY ROM:     Passive  Right eval Right  2/4  3/3 3/6 3/10  Hip flexion         Hip extension        Hip abduction        Hip adduction        Hip internal rotation        Hip external rotation        Knee flexion 40 58 74 100 106 103   Knee extension -3 -2 -4  3 3     (Blank rows = not tested)                                                                                                                               TREATMENT DATE:  3/24 Manual: All PROM performed with distraction to reduce pain and improve movement PROM knee FLEX and EXT grade 1&2 STM to quads  There-ex: BFR: Quad sets:   1x20,15,15: RPE 6  There-Act Squats 3x10 (reported catching on descent) Step up and drive 1O10 4 inch RPE 6    Manual:  Passive extension stretching with joint distraction  Grade 2 and III PA and AP  STM to HS Edema massage  Reviewed taping technique. Taped patients knee medially    There-ex:  SLR 3x12  SL SLR 3x12 Nu-step for ROM 5 in    Vaso 15 min 32 degrees medium pressure    3/14 Manual:  Passive extension stretching with joint distraction  Grade I and II PA and AP  STM to HS  There-ex:  SLR 3x12  SAQ 2x12  Exercise bike able to do complete pleat revolutions towards the end.  5 minutes low resistance Leg press Cybex to legs 1 x 12 50 pounds RPE of 2.  Patient requested to switch to 1 leg.  2 x 1250 pounds RPE of 5.  Neuro-re -ed   Hell/toe rock 2x12  Slow march 2x12   3/10 Manual:  Passive extension stretching with joint distraction  Grade I and II PA and AP  STM to HS  There-ex:  SLR 3x12  SL SLR 3x12  Neuro-re -ed   Hell/toe rock 2x12  Slow march 2x12   Vaso 15 min 32 degrees medium pressure      3/6 Manual:  Passive extension stretching with joint distraction  Grade I and II PA and AP  STM to HS  There-ex:  SLR 3x12  SAQ 3x12    Neuro-re-ed   TKE 3x10 red  Heel/toe x15   There-act:  Squat 3x10   3/3 Manual:  Passive extension stretching with joint distraction  Grade I and II PA and  AP  STM to HS [place] stability taping of the patella   There-ex:  SLR 3x12   Neuro-re-ed   TKE 3x10 red  LAQ with focus on eccentric control 3x10   There-act:  Squat 3x10  Step up 2 inch 2x10      02/28 Manual:  Contract relax into ext, 5 sec holds  STM to HS  There Ex: Recumbent bike with half rotations to tolerance x 5 min Seated with leg into extension with suspension on chair with 15lb weight to push into extension - to tolerance. X2 SL Leg press with no weight Heel raises x15   Neuro re-ed:  TKE yellow 2x10  LAQ with focus on eccentric control.                   PATIENT EDUCATION:  Education details: Teacher, music of condition, POC, HEP, exercise form/rationale Person educated: Patient Education method: Explanation, Demonstration, Tactile cues, Verbal cues,  Education comprehension: verbalized understanding, returned demonstration, verbal cues required, tactile cues required, and needs further education  HOME EXERCISE PROGRAM: Access Code: [email or web address] URL: [email or web address]    ASSESSMENT:  CLINICAL IMPRESSION: 3/24 Pt warmed up on the exercise bike  and received STM to quads for pain management. BFR was used for quad sets. Patient tolerated well but reported point pain near pes anserine region. Squats and step ups were done with education and verbal cues on controlled descent and hip drive. Pt reported no increase in symptoms and continued tolerance with HEP. Total arc of motion measured at 3- 107.     REHAB POTENTIAL: Good  CLINICAL DECISION MAKING: Stable/uncomplicated  EVALUATION COMPLEXITY: Low   GOALS: Goals reviewed with patient? Yes     SHORT TERM GOALS: goal date 4 wks post op  FWB without AD, able to ambulate household distances pain <=3/10 Baseline: see obj Goal status: Less pain walking around the house 3/14 2. ROM 0-90 without discomfort Baseline: Continues to have extension limitations 3/14 Goal status: INITIAL   3. Able to demo SLR without quad lag Baseline: see obj Goal status: Mild quad leg remains 3/14  LONG TERM GOALS: POC DATE   LEFS to at least 36/40 Baseline: see obj Goal status: INITIAL   2.  Able to navigate stairs with proper form Baseline: unable at eval Goal status: INITIAL   3.  MMT hip & knee to age-appropriate levels with hand held  dyno Baseline: not appropriate to test at eval Goal status: INITIAL   4.  Single leg balance control, pain <2/10, on stable and unstable surfaces Baseline: unable at eval Goal status: INITIAL   5.  Prepared to return to running & begin gentle plyometric program Baseline: will progress as appropraite Goal status: INITIAL   6.  Able to participate in play with kids with recognition of precautions Baseline:  Goal status: INITIAL     PLAN:  PT FREQUENCY: 1-2x/week  PT DURATION: 12 weeks  PLANNED INTERVENTIONS: 97164- PT Re-evaluation, 97110-Therapeutic exercises, 97530- Therapeutic activity, 97112- Neuromuscular re-education, 97535- Self Care, 64403- Manual therapy, [phone]- Gait training, [phone]- Aquatic Therapy, Patient/Family education, Balance training, Stair training, Taping, Dry Needling, Joint mobilization, Spinal mobilization, Scar mobilization, Cryotherapy, and Moist heat.  PLAN FOR NEXT SESSION: per Everardo Pacific Protocol (on Jess's desk)     Lorayne Bender PT DPT  02/23/24 10:19 AM [facility] [address] [place], [place], [address] Phone: [phone]   Fax:  [phone]

## 2024-02-23 ENCOUNTER — Encounter (HOSPITAL_BASED_OUTPATIENT_CLINIC_OR_DEPARTMENT_OTHER): Payer: Self-pay | Admitting: Physical Therapy

## 2024-02-25 ENCOUNTER — Encounter (HOSPITAL_BASED_OUTPATIENT_CLINIC_OR_DEPARTMENT_OTHER): Payer: Self-pay | Admitting: Physical Therapy

## 2024-02-25 ENCOUNTER — Ambulatory Visit (HOSPITAL_BASED_OUTPATIENT_CLINIC_OR_DEPARTMENT_OTHER): Payer: 59 | Admitting: Physical Therapy

## 2024-02-25 DIAGNOSIS — R262 Difficulty in walking, not elsewhere classified: Secondary | ICD-10-CM | POA: Diagnosis not present

## 2024-02-25 DIAGNOSIS — M6281 Muscle weakness (generalized): Secondary | ICD-10-CM

## 2024-02-25 DIAGNOSIS — M25561 Pain in right knee: Secondary | ICD-10-CM | POA: Diagnosis not present

## 2024-02-25 NOTE — Therapy (Signed)
 OUTPATIENT PHYSICAL THERAPY TREATMENT   Patient Name: Betty Long [id number] MRN: [id number] DOB:March 08, 1986, 38 y.o., female Today's Date: 02/25/2024  END OF SESSION:  PT End of Session - 02/25/24 1028     Visit Number 13    Number of Visits 25    Date for PT Re-Evaluation 03/26/24    PT Start Time 1027    PT Stop Time 1059    PT Time Calculation (min) 32 min    Activity Tolerance Patient tolerated treatment well;No increased pain    Behavior During Therapy WFL for tasks assessed/performed                    Past Medical History:  Diagnosis Date   Family history of breast cancer    Family history of melanoma    History of abnormal cervical Pap smear    ASCUS  ---  NEGATIVE HPV   Mass of left knee    Vaginal Pap smear, abnormal    Past Surgical History:  Procedure Laterality Date   ANTERIOR CRUCIATE LIGAMENT REPAIR Left 2010   COSMETIC SURGERY     laceration repair   KNEE ARTHROSCOPY Left 02/09/2014   Procedure: LEFT KNEE EXCISIONAL BIOPSY;  Surgeon: Eugenia Mcalpine, MD;  Location: [facility];  Service: Orthopedics;  Laterality: Left;   KNEE ARTHROSCOPY W/ LATERAL RELEASE Left 09-16-2011   PATELLA MALTRACKING   KNEE ARTHROSCOPY WITH MEDIAL MENISECTOMY Right 12/24/2023   Procedure: KNEE ARTHROSCOPY WITH MEDIAL MENISECTOMY;  Surgeon: Bjorn Pippin, MD;  Location: [facility];  Service: Orthopedics;  Laterality: Right;   MYRINGOTOMY     3mos 6 mos   TONSILLECTOMY  1993   Patient Active Problem List   Diagnosis Date Noted   Genetic testing 07/07/2021   Family history of breast cancer 06/21/2021   Family history of melanoma 06/21/2021   Indication for care in labor or delivery 10/22/2017   Indication for care in labor and delivery, antepartum 01/26/2015   NSVD (normal spontaneous vaginal delivery) 01/26/2015   S/P knee surgery 02/09/2014   Contraception management 05/12/2012   ASCUS on Pap smear 05/12/2012    REFERRING  PROVIDER:  Bjorn Pippin, MD     REFERRING DIAG: S/P right medial and lateral meniscus repair,ACL reconstruction   Rationale for Evaluation and Treatment: Rehabilitation  THERAPY DIAG:  Acute pain of right knee  Difficulty in walking, not elsewhere classified  Muscle weakness (generalized)  ONSET DATE: DOS 12/24/23   SUBJECTIVE:  SUBJECTIVE STATEMENT: 3/27 Pt states the knee is feeling good. She says it is still catching when she sits down but is not catching as much when she is walking in general.   The patient worked last night. She feels like it is swollen and stiff. She has been working on her exercises as much as she can. She sees the MD tomorrow.   PERTINENT HISTORY:  Lt ACLR and lateral release  PAIN:  Are you having pain? Yes: NPRS scale: 2-3/10 Pain location: Rt knee Pain description: uncomfortable, tight Aggravating factors: constant Relieving factors: ice  PRECAUTIONS:  Other: see WB precautions  RED FLAGS: None   WEIGHT BEARING RESTRICTIONS:  Yes through 2/20: WBAT 0-90, ROM 0-90, no tibial rotation   FALLS:  Has patient fallen in last 6 months? 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  PATIENT SURVEYS:  LEFS 15  COGNITIVE STATUS: Within functional limits for tasks assessed   SENSATION: WFL  EDEMA:  Yes: moderate  GAIT: EVAL: with bil axillary crutches   Body Part #1 Knee  PALPATION: EVAL: Good patellar mobility, no s/s of infection  LOWER EXTREMITY ROM:     Passive  Right eval Right  2/4  3/3 3/6 3/10 3/27   Hip flexion         Hip extension         Hip abduction         Hip adduction         Hip internal rotation         Hip external rotation         Knee flexion 40 58 74 100 106 103  107  Knee extension -3 -2 -4  3 3   0   (Blank rows = not tested)                                                                                                                               TREATMENT DATE:  3/27 Manual: All PROM performed with distraction to reduce pain and improve movement PROM knee FLEX and EXT grade 1&2 STM to quads and IT band There-ex:  BFR: Quad sets:   1x20,15,15: RPE 6 SLR 3x10  There-Act Squats 3x10 RPE 5   3/24 Manual: All PROM performed with distraction to reduce pain and improve movement PROM knee FLEX and EXT grade 1&2 STM to quads  There-ex: BFR: Quad sets:   1x20,15,15: RPE 6  There-Act Squats 3x10 (reported catching on descent) Step up and drive 4U98 4 inch RPE 6    Manual:  Passive extension stretching with joint distraction  Grade 2 and III PA and AP  STM to HS Edema massage  Reviewed taping  technique. Taped patients knee medially    There-ex:  SLR 3x12  SL SLR 3x12 Nu-step for ROM 5 in    Vaso 15 min 32 degrees medium pressure    3/14 Manual:  Passive extension stretching with joint distraction  Grade I and II PA and AP  STM to HS   There-ex:  SLR 3x12  SAQ 2x12  Exercise bike able to do complete pleat revolutions towards the end.  5 minutes low resistance Leg press Cybex to legs 1 x 12 50 pounds RPE of 2.  Patient requested to switch to 1 leg.  2 x 1250 pounds RPE of 5.  Neuro-re -ed   Hell/toe rock 2x12  Slow march 2x12   3/10 Manual:  Passive extension stretching with joint distraction  Grade I and II PA and AP  STM to HS  There-ex:  SLR 3x12  SL SLR 3x12  Neuro-re -ed   Hell/toe rock 2x12  Slow march 2x12   Vaso 15 min 32 degrees medium pressure      3/6 Manual:   Passive extension stretching with joint distraction  Grade I and II PA and AP  STM to HS  There-ex:  SLR 3x12  SAQ 3x12    Neuro-re-ed   TKE 3x10 red  Heel/toe x15   There-act:  Squat 3x10   3/3 Manual:  Passive extension stretching with joint distraction  Grade I and II PA and AP  STM to HS [place] stability taping of the patella   There-ex:  SLR 3x12   Neuro-re-ed   TKE 3x10 red  LAQ with focus on eccentric control 3x10   There-act:  Squat 3x10  Step up 2 inch 2x10      02/28 Manual:  Contract relax into ext, 5 sec holds  STM to HS  There Ex: Recumbent bike with half rotations to tolerance x 5 min Seated with leg into extension with suspension on chair with 15lb weight to push into extension - to tolerance. X2 SL Leg press with no weight Heel raises x15   Neuro re-ed:  TKE yellow 2x10  LAQ with focus on eccentric control.                   PATIENT EDUCATION:  Education details: Teacher, music of condition, POC, HEP, exercise form/rationale Person educated: Patient Education method: Explanation, Demonstration, Tactile cues, Verbal cues,  Education comprehension: verbalized understanding, returned demonstration, verbal cues required, tactile cues required, and needs further education  HOME EXERCISE PROGRAM: Access Code: [email or web address] URL: [email or web address]    ASSESSMENT:  CLINICAL IMPRESSION: 3/27 Pt came in and warmed up on the exercise bike. She was able to progress to full revolutions on it which is an improvement. It was tolerated well. Pt then received PROM flexion and extension to the knee with ROM measurements done. Pt showed increased ROM in flexion with extension remaining at neutral. BFR was used for quad sets at completing 3 sets with mild discomfort. Moved on to squats where the pt described catching on the eccentric portion. Pt will continue to benefit from skilled physical therapy.    REHAB POTENTIAL:  Good  CLINICAL DECISION MAKING: Stable/uncomplicated  EVALUATION COMPLEXITY: Low   GOALS: Goals reviewed with patient? Yes     SHORT TERM GOALS: goal date 4 wks post op  FWB without AD, able to ambulate household distances pain <=3/10 Baseline: see obj Goal status: Less pain walking around the house 3/14 2. ROM 0-90 without discomfort Baseline: Continues to have  extension limitations 3/14 Goal status: INITIAL  3. Able to demo SLR without quad lag Baseline: see obj Goal status: Mild quad leg remains 3/14  LONG TERM GOALS: POC DATE   LEFS to at least 36/40 Baseline: see obj Goal status: INITIAL   2.  Able to navigate stairs with proper form Baseline: unable at eval Goal status: INITIAL   3.  MMT hip & knee to age-appropriate levels with hand held dyno Baseline: not appropriate to test at eval Goal status: INITIAL   4.  Single leg balance control, pain <2/10, on stable and unstable surfaces Baseline: unable at eval Goal status: INITIAL   5.  Prepared to return to running & begin gentle plyometric program Baseline: will progress as appropraite Goal status: INITIAL   6.  Able to participate in play with kids with recognition of precautions Baseline:  Goal status: INITIAL     PLAN:  PT FREQUENCY: 1-2x/week  PT DURATION: 12 weeks  PLANNED INTERVENTIONS: 97164- PT Re-evaluation, 97110-Therapeutic exercises, 97530- Therapeutic activity, 97112- Neuromuscular re-education, 97535- Self Care, 13086- Manual therapy, [phone]- Gait training, [phone]- Aquatic Therapy, Patient/Family education, Balance training, Stair training, Taping, Dry Needling, Joint mobilization, Spinal mobilization, Scar mobilization, Cryotherapy, and Moist heat.  PLAN FOR NEXT SESSION: per Everardo Pacific Protocol (on Jess's desk)     Lorayne Bender PT DPT Landry Corporal SPT 02/25/24 12:22 PM [facility] [address] [place], [place], [address] Phone:  [phone]   Fax:  [phone]

## 2024-02-29 ENCOUNTER — Encounter (HOSPITAL_BASED_OUTPATIENT_CLINIC_OR_DEPARTMENT_OTHER): Payer: Self-pay

## 2024-02-29 ENCOUNTER — Ambulatory Visit (HOSPITAL_BASED_OUTPATIENT_CLINIC_OR_DEPARTMENT_OTHER): Payer: 59

## 2024-02-29 DIAGNOSIS — M25561 Pain in right knee: Secondary | ICD-10-CM | POA: Diagnosis not present

## 2024-02-29 DIAGNOSIS — R262 Difficulty in walking, not elsewhere classified: Secondary | ICD-10-CM

## 2024-02-29 DIAGNOSIS — M6281 Muscle weakness (generalized): Secondary | ICD-10-CM | POA: Diagnosis not present

## 2024-02-29 NOTE — Therapy (Signed)
 OUTPATIENT PHYSICAL THERAPY TREATMENT   Patient Name: Betty Long BJYNWGN MRN: [id number] DOB:17-May-1986, 38 y.o., female Today's Date: 02/29/2024  END OF SESSION:  PT End of Session - 02/29/24 0916     Visit Number 14    Number of Visits 25    Date for PT Re-Evaluation 03/26/24    Authorization Type [facility]    PT Start Time 1019    PT Stop Time 1100    PT Time Calculation (min) 41 min    Activity Tolerance Patient tolerated treatment well    Behavior During Therapy WFL for tasks assessed/performed                     Past Medical History:  Diagnosis Date   Family history of breast cancer    Family history of melanoma    History of abnormal cervical Pap smear    ASCUS  ---  NEGATIVE HPV   Mass of left knee    Vaginal Pap smear, abnormal    Past Surgical History:  Procedure Laterality Date   ANTERIOR CRUCIATE LIGAMENT REPAIR Left 2010   COSMETIC SURGERY     laceration repair   KNEE ARTHROSCOPY Left 02/09/2014   Procedure: LEFT KNEE EXCISIONAL BIOPSY;  Surgeon: Eugenia Mcalpine, MD;  Location: [facility];  Service: Orthopedics;  Laterality: Left;   KNEE ARTHROSCOPY W/ LATERAL RELEASE Left 09-16-2011   PATELLA MALTRACKING   KNEE ARTHROSCOPY WITH MEDIAL MENISECTOMY Right 12/24/2023   Procedure: KNEE ARTHROSCOPY WITH MEDIAL MENISECTOMY;  Surgeon: Bjorn Pippin, MD;  Location: [facility];  Service: Orthopedics;  Laterality: Right;   MYRINGOTOMY     3mos 6 mos   TONSILLECTOMY  1993   Patient Active Problem List   Diagnosis Date Noted   Genetic testing 07/07/2021   Family history of breast cancer 06/21/2021   Family history of melanoma 06/21/2021   Indication for care in labor or delivery 10/22/2017   Indication for care in labor and delivery, antepartum 01/26/2015   NSVD (normal spontaneous vaginal delivery) 01/26/2015   S/P knee surgery 02/09/2014   Contraception management 05/12/2012   ASCUS on Pap smear 05/12/2012     REFERRING PROVIDER:  Bjorn Pippin, MD     REFERRING DIAG: S/P right medial and lateral meniscus repair,ACL reconstruction   Rationale for Evaluation and Treatment: Rehabilitation  THERAPY DIAG:  Acute pain of right knee  Difficulty in walking, not elsewhere classified  Muscle weakness (generalized)  ONSET DATE: DOS 12/24/23   SUBJECTIVE:  SUBJECTIVE STATEMENT: Pt reports increased stiffness after walking a lot at kids baseball games this Saturday. Has also had increased catching in knee cap. 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   Marland Kitchen   PERTINENT HISTORY:  Lt ACLR and lateral release  PAIN:  Are you having pain? Yes: NPRS scale: 2-3/10 Pain location: Rt knee Pain description: uncomfortable, tight Aggravating factors: constant Relieving factors: ice  PRECAUTIONS:  Other: see WB precautions  RED FLAGS: None   WEIGHT BEARING RESTRICTIONS:  Yes through 2/20: WBAT 0-90, ROM 0-90, no tibial rotation   FALLS:  Has patient fallen in last 6 months? 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  PATIENT SURVEYS:  LEFS 15  COGNITIVE STATUS: Within functional limits for tasks assessed   SENSATION: WFL  EDEMA:  Yes: moderate  GAIT: EVAL: with bil axillary crutches   Body Part #1 Knee  PALPATION: EVAL: Good patellar mobility, no s/s of infection  LOWER EXTREMITY ROM:     Passive  Right eval Right  2/4  3/3 3/6 3/10 3/27  Hip flexion         Hip extension         Hip abduction         Hip adduction         Hip internal rotation          Hip external rotation         Knee flexion 40 58 74 100 106 103  107  Knee extension -3 -2 -4  3 3   0   (Blank rows = not tested)                                                                                                                               TREATMENT DATE:   02/29/24  Manual:   PROM knee FLEX and EXT Patella mobilizations STM to medial HS  There-ex:   BFR: Quad sets:   x30  SLR 3x10  Squats 3x10  Recumbent bike for ROM x22min   3/27 Manual: All PROM performed with distraction to reduce pain and improve movement PROM knee FLEX and EXT grade 1&2 STM to quads and IT band There-ex:  BFR: Quad sets:   1x20,15,15: RPE 6 SLR 3x10  There-Act Squats 3x10 RPE 5   3/24 Manual: All PROM performed with distraction to reduce pain and improve movement PROM knee FLEX and EXT grade 1&2 STM to quads  There-ex: BFR: Quad sets:   1x20,15,15: RPE 6  There-Act Squats 3x10 (reported catching on descent) Step up and drive 1Y78 4 inch RPE 6    Manual:  Passive extension stretching with joint distraction  Grade 2 and III PA  and AP  STM to HS Edema massage  Reviewed taping technique. Taped patients knee medially    There-ex:  SLR 3x12  SL SLR 3x12 Nu-step for ROM 5 in    Vaso 15 min 32 degrees medium pressure     PATIENT EDUCATION:  Education details: Teacher, music of condition, POC, HEP, exercise form/rationale Person educated: Patient Education method: Explanation, Demonstration, Tactile cues, Verbal cues,  Education comprehension: verbalized understanding, returned demonstration, verbal cues required, tactile cues required, and needs further education  HOME EXERCISE PROGRAM: Access Code: [email or web address] URL: [email or web address]    ASSESSMENT:  CLINICAL IMPRESSION: Continued with BFR at . Pt completed quads sets with this applied, but did experience slight discoloration of L LE, so  removed following this. She reported improved engagement in quads with squats today. Pt demonstrated improved knee extension following STM to medial HS and gentle PROM. Instructed pt to continue working on flexion ROM at home as this does remain restricted. Will continue to progress strength and ROM as tolerated in clinic.     REHAB POTENTIAL: Good  CLINICAL DECISION MAKING: Stable/uncomplicated  EVALUATION COMPLEXITY: Low   GOALS: Goals reviewed with patient? Yes     SHORT TERM GOALS: goal date 4 wks post op  FWB without AD, able to ambulate household distances pain <=3/10 Baseline: see obj Goal status: Less pain walking around the house 3/14 2. ROM 0-90 without discomfort Baseline: Continues to have extension limitations 3/14 Goal status: INITIAL  3. Able to demo SLR without quad lag Baseline: see obj Goal status: Mild quad leg remains 3/14  LONG TERM GOALS: POC DATE   LEFS to at least 36/40 Baseline: see obj Goal status: INITIAL   2.  Able to navigate stairs with proper form Baseline: unable at eval Goal status: INITIAL   3.  MMT hip & knee to age-appropriate levels with hand held dyno Baseline: not appropriate to test at eval Goal status: INITIAL   4.  Single leg balance control, pain <2/10, on stable and unstable surfaces Baseline: unable at eval Goal status: INITIAL   5.  Prepared to return to running & begin gentle plyometric program Baseline: will progress as appropraite Goal status: INITIAL   6.  Able to participate in play with kids with recognition of precautions Baseline:  Goal status: INITIAL     PLAN:  PT FREQUENCY: 1-2x/week  PT DURATION: 12 weeks  PLANNED INTERVENTIONS: 97164- PT Re-evaluation, 97110-Therapeutic exercises, 97530- Therapeutic activity, 97112- Neuromuscular re-education, 97535- Self Care, 40981- Manual therapy, [phone]- Gait training, [phone]- Aquatic Therapy, Patient/Family education, Balance training, Stair training, Taping,  Dry Needling, Joint mobilization, Spinal mobilization, Scar mobilization, Cryotherapy, and Moist heat.  PLAN FOR NEXT SESSION: per Everardo Pacific Protocol (on Jess's desk)    Riki Altes, PTA  02/29/24 10:38 AM [facility] [address] [place], [place], [address] Phone: [phone]   Fax:  [phone]

## 2024-03-03 ENCOUNTER — Encounter (HOSPITAL_BASED_OUTPATIENT_CLINIC_OR_DEPARTMENT_OTHER): Payer: 59

## 2024-03-04 ENCOUNTER — Other Ambulatory Visit: Payer: Self-pay | Admitting: Family

## 2024-03-04 DIAGNOSIS — N6002 Solitary cyst of left breast: Secondary | ICD-10-CM

## 2024-03-07 ENCOUNTER — Encounter (HOSPITAL_BASED_OUTPATIENT_CLINIC_OR_DEPARTMENT_OTHER): Payer: Self-pay | Admitting: Physical Therapy

## 2024-03-07 ENCOUNTER — Other Ambulatory Visit: Payer: Self-pay | Admitting: Family

## 2024-03-07 ENCOUNTER — Ambulatory Visit (HOSPITAL_BASED_OUTPATIENT_CLINIC_OR_DEPARTMENT_OTHER): Payer: 59 | Attending: Family | Admitting: Physical Therapy

## 2024-03-07 DIAGNOSIS — M25561 Pain in right knee: Secondary | ICD-10-CM | POA: Insufficient documentation

## 2024-03-07 DIAGNOSIS — R262 Difficulty in walking, not elsewhere classified: Secondary | ICD-10-CM | POA: Diagnosis not present

## 2024-03-07 DIAGNOSIS — N6002 Solitary cyst of left breast: Secondary | ICD-10-CM

## 2024-03-07 DIAGNOSIS — M6281 Muscle weakness (generalized): Secondary | ICD-10-CM | POA: Insufficient documentation

## 2024-03-07 NOTE — Therapy (Signed)
 OUTPATIENT PHYSICAL THERAPY TREATMENT   Patient Name: Betty Long ZOXWRUE MRN: [id number] DOB:01/24/1986, 38 y.o., female Today's Date: 03/07/2024  END OF SESSION:  PT End of Session - 03/07/24 0935     Visit Number 15    Number of Visits 25    Date for PT Re-Evaluation 03/26/24    Authorization Type [facility]    PT Start Time 0845    PT Stop Time 0929    PT Time Calculation (min) 44 min    Activity Tolerance Patient tolerated treatment well;No increased pain    Behavior During Therapy WFL for tasks assessed/performed                      Past Medical History:  Diagnosis Date   Family history of breast cancer    Family history of melanoma    History of abnormal cervical Pap smear    ASCUS  ---  NEGATIVE HPV   Mass of left knee    Vaginal Pap smear, abnormal    Past Surgical History:  Procedure Laterality Date   ANTERIOR CRUCIATE LIGAMENT REPAIR Left 2010   COSMETIC SURGERY     laceration repair   KNEE ARTHROSCOPY Left 02/09/2014   Procedure: LEFT KNEE EXCISIONAL BIOPSY;  Surgeon: Eugenia Mcalpine, MD;  Location: [facility];  Service: Orthopedics;  Laterality: Left;   KNEE ARTHROSCOPY W/ LATERAL RELEASE Left 09-16-2011   PATELLA MALTRACKING   KNEE ARTHROSCOPY WITH MEDIAL MENISECTOMY Right 12/24/2023   Procedure: KNEE ARTHROSCOPY WITH MEDIAL MENISECTOMY;  Surgeon: Bjorn Pippin, MD;  Location: [facility];  Service: Orthopedics;  Laterality: Right;   MYRINGOTOMY     3mos 6 mos   TONSILLECTOMY  1993   Patient Active Problem List   Diagnosis Date Noted   Genetic testing 07/07/2021   Family history of breast cancer 06/21/2021   Family history of melanoma 06/21/2021   Indication for care in labor or delivery 10/22/2017   Indication for care in labor and delivery, antepartum 01/26/2015   NSVD (normal spontaneous vaginal delivery) 01/26/2015   S/P knee surgery 02/09/2014   Contraception management 05/12/2012   ASCUS on Pap smear  05/12/2012    REFERRING PROVIDER:  Bjorn Pippin, MD     REFERRING DIAG: S/P right medial and lateral meniscus repair,ACL reconstruction   Rationale for Evaluation and Treatment: Rehabilitation  THERAPY DIAG:  No diagnosis found.  ONSET DATE: DOS 12/24/23   SUBJECTIVE:  SUBJECTIVE STATEMENT: Pt reports the knee has been feeling good. She has started going to the gym and progressed to the leg press which she states has been feeling good.   PERTINENT HISTORY:  Lt ACLR and lateral release  PAIN:  Are you having pain? Yes: NPRS scale: 2-3/10 Pain location: Rt knee Pain description: uncomfortable, tight Aggravating factors: constant Relieving factors: ice  PRECAUTIONS:  Other: see WB precautions  RED FLAGS: None   WEIGHT BEARING RESTRICTIONS:  Yes through 2/20: WBAT 0-90, ROM 0-90, no tibial rotation   FALLS:  Has patient fallen in last 6 months? 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  PATIENT SURVEYS:  LEFS 15  COGNITIVE STATUS: Within functional limits for tasks assessed   SENSATION: WFL  EDEMA:  Yes: moderate  GAIT: EVAL: with bil axillary crutches   Body Part #1 Knee  PALPATION: EVAL: Good patellar mobility, no s/s of infection  LOWER EXTREMITY ROM:     Passive  Right eval Right  2/4  3/3 3/6 3/10 3/27 4/7  Hip flexion          Hip extension          Hip abduction          Hip adduction          Hip internal rotation          Hip external rotation          Knee flexion 40 58 74 100 106 103  107 110.5  Knee  extension -3 -2 -4  3 3   0    (Blank rows = not tested)                                                                                                                               TREATMENT DATE:  4/7 Manual: All PROM performed with distraction to reduce pain and improve movement PROM knee FLEX and EXT Patella mobilizations There-ex: BFR: Quad sets:  20,15,15 1lbs RPE 5  Leg press 3x10 65lbs RPE 5 Recumbent bike for ROM x33min     02/29/24  Manual:   PROM knee FLEX and EXT Patella mobilizations STM to medial HS  There-ex:   BFR: Quad sets:   x30  SLR 3x10  Squats 3x10  Recumbent bike for ROM x60min   3/27 Manual: All PROM performed with distraction to reduce pain and improve movement PROM knee FLEX and EXT grade 1&2 STM to quads and IT band There-ex:  BFR: Quad sets:   1x20,15,15: RPE 6 SLR 3x10  There-Act Squats 3x10 RPE 5   3/24 Manual: All PROM performed with distraction to reduce pain and improve movement PROM  knee FLEX and EXT grade 1&2 STM to quads  There-ex: BFR: Quad sets:   1x20,15,15: RPE 6  There-Act Squats 3x10 (reported catching on descent) Step up and drive 4U98 4 inch RPE 6    Manual:  Passive extension stretching with joint distraction  Grade 2 and III PA and AP  STM to HS Edema massage  Reviewed taping technique. Taped patients knee medially    There-ex:  SLR 3x12  SL SLR 3x12 Nu-step for ROM 5 in    Vaso 15 min 32 degrees medium pressure     PATIENT EDUCATION:  Education details: Teacher, music of condition, POC, HEP, exercise form/rationale Person educated: Patient Education method: Explanation, Demonstration, Tactile cues, Verbal cues,  Education comprehension: verbalized understanding, returned demonstration, verbal cues required, tactile cues required, and needs further education  HOME EXERCISE PROGRAM: Access Code: [email or web address] URL:  [email or web address]    ASSESSMENT:  CLINICAL IMPRESSION: Pt warmed up on exercise bike for 5 min able to complete full revolutions with no increase in pain. PROM was performed with patellar mobs and STM to quads and posterior knee. ROM was also assessed noted in objective. Pt gained 3 deg of knee FLEX. Therex was performed with BFR at . 1lb was added to quad sets which was tolerated well. Leg press was also performed with proper cueing. Pt will continue to benefit from skilled physical therapy to progress MD protocol.     REHAB POTENTIAL: Good  CLINICAL DECISION MAKING: Stable/uncomplicated  EVALUATION COMPLEXITY: Low   GOALS: Goals reviewed with patient? Yes     SHORT TERM GOALS: goal date 4 wks post op  FWB without AD, able to ambulate household distances pain <=3/10 Baseline: see obj Goal status: Less pain walking around the house 3/14 2. ROM 0-90 without discomfort Baseline: Continues to have extension limitations 3/14 Goal status: achieved 4/7 3. Able to demo SLR without quad lag Baseline: see obj Goal status: No quad lag 4/7  LONG TERM GOALS: POC DATE   LEFS to at least 36/40 Baseline: see obj Goal status: INITIAL   2.  Able to navigate stairs with proper form Baseline: unable at eval Goal status: INITIAL   3.  MMT hip & knee to age-appropriate levels with hand held dyno Baseline: not appropriate to test at eval Goal status: INITIAL   4.  Single leg balance control, pain <2/10, on stable and unstable surfaces Baseline: unable at eval Goal status: INITIAL   5.  Prepared to return to running & begin gentle plyometric program Baseline: will progress as appropraite Goal status: INITIAL   6.  Able to participate in play with kids with recognition of precautions Baseline:  Goal status: INITIAL     PLAN:  PT FREQUENCY: 1-2x/week  PT DURATION: 12 weeks  PLANNED INTERVENTIONS: 97164- PT Re-evaluation, 97110-Therapeutic  exercises, 97530- Therapeutic activity, 97112- Neuromuscular re-education, 97535- Self Care, 11914- Manual therapy, [phone]- Gait training, [phone]- Aquatic Therapy, Patient/Family education, Balance training, Stair training, Taping, Dry Needling, Joint mobilization, Spinal mobilization, Scar mobilization, Cryotherapy, and Moist heat.  PLAN FOR NEXT SESSION: per Everardo Pacific Protocol (on Jess's desk)   Lorayne Bender PT DPT              I have reviewed and concur with this student's documentation.   Dessie Coma, PT 03/07/2024 12:50 PM  During this treatment session, the therapist was present, participating in and directing the treatment.   Landry Corporal SPT  03/07/24 9:36 AM [facility] [phone]  [place], [place], [address] Phone: [phone]   Fax:  [phone]

## 2024-03-10 ENCOUNTER — Encounter (HOSPITAL_BASED_OUTPATIENT_CLINIC_OR_DEPARTMENT_OTHER): Payer: Self-pay | Admitting: Physical Therapy

## 2024-03-10 ENCOUNTER — Ambulatory Visit (HOSPITAL_BASED_OUTPATIENT_CLINIC_OR_DEPARTMENT_OTHER): Payer: 59 | Admitting: Physical Therapy

## 2024-03-10 DIAGNOSIS — M25561 Pain in right knee: Secondary | ICD-10-CM

## 2024-03-10 DIAGNOSIS — M6281 Muscle weakness (generalized): Secondary | ICD-10-CM | POA: Diagnosis not present

## 2024-03-10 DIAGNOSIS — R262 Difficulty in walking, not elsewhere classified: Secondary | ICD-10-CM | POA: Diagnosis not present

## 2024-03-10 NOTE — Therapy (Signed)
 OUTPATIENT PHYSICAL THERAPY TREATMENT   Patient Name: Betty Long ZOXWRUE MRN: [id number] DOB:Jul 31, 1986, 38 y.o., female Today's Date: 03/11/2024  END OF SESSION:  PT End of Session - 03/10/24 1404     Visit Number 16    Number of Visits 25    Date for PT Re-Evaluation 03/26/24    Authorization Type [facility]    PT Start Time 1404    PT Stop Time 1444    PT Time Calculation (min) 40 min    Activity Tolerance Patient tolerated treatment well;No increased pain    Behavior During Therapy WFL for tasks assessed/performed                      Past Medical History:  Diagnosis Date   Family history of breast cancer    Family history of melanoma    History of abnormal cervical Pap smear    ASCUS  ---  NEGATIVE HPV   Mass of left knee    Vaginal Pap smear, abnormal    Past Surgical History:  Procedure Laterality Date   ANTERIOR CRUCIATE LIGAMENT REPAIR Left 2010   COSMETIC SURGERY     laceration repair   KNEE ARTHROSCOPY Left 02/09/2014   Procedure: LEFT KNEE EXCISIONAL BIOPSY;  Surgeon: Eugenia Mcalpine, MD;  Location: [facility];  Service: Orthopedics;  Laterality: Left;   KNEE ARTHROSCOPY W/ LATERAL RELEASE Left 09-16-2011   PATELLA MALTRACKING   KNEE ARTHROSCOPY WITH MEDIAL MENISECTOMY Right 12/24/2023   Procedure: KNEE ARTHROSCOPY WITH MEDIAL MENISECTOMY;  Surgeon: Bjorn Pippin, MD;  Location: [facility];  Service: Orthopedics;  Laterality: Right;   MYRINGOTOMY     3mos 6 mos   TONSILLECTOMY  1993   Patient Active Problem List   Diagnosis Date Noted   Genetic testing 07/07/2021   Family history of breast cancer 06/21/2021   Family history of melanoma 06/21/2021   Indication for care in labor or delivery 10/22/2017   Indication for care in labor and delivery, antepartum 01/26/2015   NSVD (normal spontaneous vaginal delivery) 01/26/2015   S/P knee surgery 02/09/2014   Contraception management 05/12/2012   ASCUS on Pap smear  05/12/2012    REFERRING PROVIDER:  Bjorn Pippin, MD     REFERRING DIAG: S/P right medial and lateral meniscus repair,ACL reconstruction   Rationale for Evaluation and Treatment: Rehabilitation  THERAPY DIAG:  Acute pain of right knee  Difficulty in walking, not elsewhere classified  Muscle weakness (generalized)  ONSET DATE: DOS 12/24/23   SUBJECTIVE:  SUBJECTIVE STATEMENT: 4/10 Pt says the knee is feeling better. Says she had a little tightness in the anterior knee has not affected her day to day.   PERTINENT HISTORY:  Lt ACLR and lateral release  PAIN:  Are you having pain? Yes: NPRS scale: 2-3/10 Pain location: Rt knee Pain description: uncomfortable, tight Aggravating factors: constant Relieving factors: ice  PRECAUTIONS:  Other: see WB precautions  RED FLAGS: None   WEIGHT BEARING RESTRICTIONS:  Yes through 2/20: WBAT 0-90, ROM 0-90, no tibial rotation   FALLS:  Has patient fallen in last 6 months? 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  PATIENT SURVEYS:  LEFS 15  COGNITIVE STATUS: Within functional limits for tasks assessed   SENSATION: WFL  EDEMA:  Yes: moderate  GAIT: EVAL: with bil axillary crutches   Body Part #1 Knee  PALPATION: EVAL: Good patellar mobility, no s/s of infection  LOWER EXTREMITY ROM:     Passive  Right eval Right  2/4  3/3 3/6 3/10 3/27 4/7  Hip flexion          Hip extension          Hip abduction          Hip adduction          Hip internal rotation          Hip external rotation           Knee flexion 40 58 74 100 106 103  107 110.5  Knee extension -3 -2 -4  3 3   0    (Blank rows = not tested)                                                                                                                               TREATMENT DATE:  4/10 Manual: PROM performed with distraction to reduce pain and improve movement PROM knee FLEX and EXT Patella mobilizations There-ex: BFR: (leave pumped up when doing sets) Quad sets:  20,15,15 1lbs RPE 5 There-Act Step ups w hip flexion 3x10 8in Standing marching 2x10 red RTB Squats 3x10    4/7 Manual: All PROM performed with distraction to reduce pain and improve movement PROM knee FLEX and EXT Patella mobilizations There-ex: BFR: Quad sets:  20,15,15 1lbs RPE 5  Leg press 3x10 65lbs RPE 5 Recumbent bike for ROM x34min     02/29/24  Manual:   PROM knee FLEX and EXT Patella mobilizations STM to medial HS  There-ex:   BFR: Quad sets:   x30  SLR 3x10  Squats 3x10  Recumbent bike for ROM x35min   3/27 Manual: All PROM performed with distraction to reduce pain  and improve movement PROM knee FLEX and EXT grade 1&2 STM to quads and IT band There-ex:  BFR: Quad sets:   1x20,15,15: RPE 6 SLR 3x10  There-Act Squats 3x10 RPE 5   3/24 Manual: All PROM performed with distraction to reduce pain and improve movement PROM knee FLEX and EXT grade 1&2 STM to quads  There-ex: BFR: Quad sets:   1x20,15,15: RPE 6  There-Act Squats 3x10 (reported catching on descent) Step up and drive 1H08 4 inch RPE 6    Manual:  Passive extension stretching with joint distraction  Grade 2 and III PA and AP  STM to HS Edema massage  Reviewed taping technique. Taped patients knee medially    There-ex:  SLR 3x12  SL SLR 3x12 Nu-step for ROM 5 in    Vaso 15 min 32 degrees medium pressure     PATIENT  EDUCATION:  Education details: Teacher, music of condition, POC, HEP, exercise form/rationale Person educated: Patient Education method: Explanation, Demonstration, Tactile cues, Verbal cues,  Education comprehension: verbalized understanding, returned demonstration, verbal cues required, tactile cues required, and needs further education  HOME EXERCISE PROGRAM: Access Code: [email or web address] URL: [email or web address]    ASSESSMENT:  CLINICAL IMPRESSION: Pt warmed up on exercise bike for 5 min able to complete full revolutions with no increase in pain. PROM was performed and STM to quads and posterior knee. Quad sets were performed with BFR at . 1lb was added to quad sets which was tolerated well. BFR cuff was pumped up the whole time noting increased muscle soreness. Would advise to leave pumped up for further treatments with BFR.Therapeutic activities were performed with no issues and good form. Pt was educated on noticeable improvements with exercises and symptoms. Pt will continue to benefit from skilled physical therapy to progress MD protocol.     REHAB POTENTIAL: Good  CLINICAL DECISION MAKING: Stable/uncomplicated  EVALUATION COMPLEXITY: Low   GOALS: Goals reviewed with patient? Yes     SHORT TERM GOALS: goal date 4 wks post op  FWB without AD, able to ambulate household distances pain <=3/10 Baseline: see obj Goal status: Less pain walking around the house 3/14 2. ROM 0-90 without discomfort Baseline: Continues to have extension limitations 3/14 Goal status: achieved 4/7 3. Able to demo SLR without quad lag Baseline: see obj Goal status: No quad lag 4/7  LONG TERM GOALS: POC DATE   LEFS to at least 36/40 Baseline: see obj Goal status: INITIAL   2.  Able to navigate stairs with proper form Baseline: unable at eval Goal status: INITIAL   3.  MMT hip & knee to age-appropriate levels with hand held dyno Baseline: not appropriate to test at eval Goal  status: INITIAL   4.  Single leg balance control, pain <2/10, on stable and unstable surfaces Baseline: unable at eval Goal status: INITIAL   5.  Prepared to return to running & begin gentle plyometric program Baseline: will progress as appropraite Goal status: INITIAL   6.  Able to participate in play with kids with recognition of precautions Baseline:  Goal status: INITIAL     PLAN:  PT FREQUENCY: 1-2x/week  PT DURATION: 12 weeks  PLANNED INTERVENTIONS: 97164- PT Re-evaluation, 97110-Therapeutic exercises, 97530- Therapeutic activity, 97112- Neuromuscular re-education, 97535- Self Care, 65784- Manual therapy, [phone]- Gait training, [phone]- Aquatic Therapy, Patient/Family education, Balance training, Stair training, Taping, Dry Needling, Joint mobilization, Spinal mobilization, Scar mobilization, Cryotherapy, and Moist heat.  PLAN FOR NEXT SESSION: per Everardo Pacific Protocol (on  Jess's desk)   Lorayne Bender PT DPT              I have reviewed and concur with this student's documentation.   Landry Corporal, Student-PT 03/11/2024 7:58 AM  During this treatment session, the therapist was present, participating in and directing the treatment.   03/11/24 7:58 AM [facility] [address] [place], [place], [address] Phone: [phone]   Fax:  [phone]

## 2024-03-11 ENCOUNTER — Encounter (HOSPITAL_BASED_OUTPATIENT_CLINIC_OR_DEPARTMENT_OTHER): Payer: Self-pay | Admitting: Physical Therapy

## 2024-03-14 ENCOUNTER — Encounter (HOSPITAL_BASED_OUTPATIENT_CLINIC_OR_DEPARTMENT_OTHER): Payer: 59 | Admitting: Physical Therapy

## 2024-03-15 ENCOUNTER — Encounter (HOSPITAL_BASED_OUTPATIENT_CLINIC_OR_DEPARTMENT_OTHER): Payer: Self-pay | Admitting: Physical Therapy

## 2024-03-15 ENCOUNTER — Ambulatory Visit (HOSPITAL_BASED_OUTPATIENT_CLINIC_OR_DEPARTMENT_OTHER): Admitting: Physical Therapy

## 2024-03-15 DIAGNOSIS — M6281 Muscle weakness (generalized): Secondary | ICD-10-CM | POA: Diagnosis not present

## 2024-03-15 DIAGNOSIS — R262 Difficulty in walking, not elsewhere classified: Secondary | ICD-10-CM | POA: Diagnosis not present

## 2024-03-15 DIAGNOSIS — M25561 Pain in right knee: Secondary | ICD-10-CM | POA: Diagnosis not present

## 2024-03-15 NOTE — Therapy (Signed)
 OUTPATIENT PHYSICAL THERAPY TREATMENT   Patient Name: Betty Long MRN: [id number] DOB:03-03-1986, 38 y.o., female Today's Date: 03/15/2024  END OF SESSION:  PT End of Session - 03/15/24 1047     Visit Number 17    Number of Visits 25    Date for PT Re-Evaluation 03/26/24    Authorization Type MC    PT Start Time 0845    PT Stop Time 0929    PT Time Calculation (min) 44 min    Activity Tolerance Patient tolerated treatment well;No increased pain    Behavior During Therapy WFL for tasks assessed/performed                       Past Medical History:  Diagnosis Date   Family history of breast cancer    Family history of melanoma    History of abnormal cervical Pap smear    ASCUS  ---  NEGATIVE HPV   Mass of left knee    Vaginal Pap smear, abnormal    Past Surgical History:  Procedure Laterality Date   ANTERIOR CRUCIATE LIGAMENT REPAIR Left 2010   COSMETIC SURGERY     laceration repair   KNEE ARTHROSCOPY Left 02/09/2014   Procedure: LEFT KNEE EXCISIONAL BIOPSY;  Surgeon: Genevie Kerns, MD;  Location: [facility];  Service: Orthopedics;  Laterality: Left;   KNEE ARTHROSCOPY W/ LATERAL RELEASE Left 09-16-2011   PATELLA MALTRACKING   KNEE ARTHROSCOPY WITH MEDIAL MENISECTOMY Right 12/24/2023   Procedure: KNEE ARTHROSCOPY WITH MEDIAL MENISECTOMY;  Surgeon: Micheline Ahr, MD;  Location: [facility];  Service: Orthopedics;  Laterality: Right;   MYRINGOTOMY     3mos 6 mos   TONSILLECTOMY  1993   Patient Active Problem List   Diagnosis Date Noted   Genetic testing 07/07/2021   Family history of breast cancer 06/21/2021   Family history of melanoma 06/21/2021   Indication for care in labor or delivery 10/22/2017   Indication for care in labor and delivery, antepartum 01/26/2015   NSVD (normal spontaneous vaginal delivery) 01/26/2015   S/P knee surgery 02/09/2014   Contraception management 05/12/2012   ASCUS on Pap smear  05/12/2012    REFERRING PROVIDER:  Micheline Ahr, MD     REFERRING DIAG: S/P right medial and lateral meniscus repair,ACL reconstruction   Rationale for Evaluation and Treatment: Rehabilitation  THERAPY DIAG:  No diagnosis found.  ONSET DATE: DOS 12/24/23   SUBJECTIVE:  SUBJECTIVE STATEMENT: 4/15 Says the knee is feeling tight because she was walking through the sand at the beach. Says her opposite hip was hurting really bad while walking at the beach.   PERTINENT HISTORY:  Lt ACLR and lateral release  PAIN:  Are you having pain? Yes: NPRS scale: 2-3/10 Pain location: Rt knee Pain description: uncomfortable, tight Aggravating factors: constant Relieving factors: ice  PRECAUTIONS:  Other: see WB precautions  RED FLAGS: None   WEIGHT BEARING RESTRICTIONS:  Yes through 2/20: WBAT 0-90, ROM 0-90, no tibial rotation   FALLS:  Has patient fallen in last 6 months? 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  PATIENT SURVEYS:  LEFS 15  COGNITIVE STATUS: Within functional limits for tasks assessed   SENSATION: WFL  EDEMA:  Yes: moderate  GAIT: EVAL: with bil axillary crutches   Body Part #1 Knee  PALPATION: EVAL: Good patellar mobility, no s/s of infection  LOWER EXTREMITY ROM:     Passive  Right eval Right  2/4  3/3 3/6 3/10 3/27 4/7  Hip flexion          Hip extension          Hip abduction          Hip adduction          Hip internal rotation          Hip external rotation          Knee flexion 40 58 74 100 106 103  107  110.5  Knee extension -3 -2 -4  3 3   0    (Blank rows = not tested)                                                                                                                               TREATMENT DATE:  4/15 Manual: All PROM performed with distraction to reduce pain and improve movement PROM knee FLEX, EXT FLEX EXT mobs  grade 2 There-ex: Sci fit exercise bike (no increase in symptoms)  BFR: (leave pumped up when doing sets) Quad sets: 1lbs 20,15,15 1lbs RPE 5 Leg press 70lbs 3x10 There-Act Walkouts fwd bckwd 2x5 each way     4/10 Manual: PROM performed with distraction to reduce pain and improve movement PROM knee FLEX and EXT Patella mobilizations There-ex: BFR: (leave pumped up when doing sets) Quad sets:  20,15,15 1lbs RPE 5 There-Act Step ups w hip flexion 3x10 8in Standing marching 2x10 red RTB Squats 3x10    4/7 Manual: All PROM performed with distraction to reduce pain and improve movement PROM knee FLEX and EXT Patella mobilizations There-ex: BFR: Quad sets:  20,15,15 1lbs RPE 5  Leg press 3x10 65lbs RPE 5 Recumbent bike for ROM x55min     02/29/24  Manual:   PROM knee FLEX and EXT Patella mobilizations STM to medial HS  There-ex:   BFR: Quad sets:   x30  SLR 3x10  Squats 3x10  Recumbent bike for ROM x9min   3/27 Manual: All PROM performed with distraction to reduce pain and improve movement PROM knee FLEX and EXT grade 1&2 STM to quads and IT band There-ex:  BFR: Quad sets:   1x20,15,15: RPE 6 SLR 3x10  There-Act Squats 3x10 RPE 5      PATIENT EDUCATION:  Education details: Teacher, music of condition, POC, HEP, exercise form/rationale Person educated: Patient Education method: Explanation, Demonstration, Tactile cues, Verbal cues,  Education comprehension: verbalized understanding, returned  demonstration, verbal cues required, tactile cues required, and needs further education  HOME EXERCISE PROGRAM: Access Code: [email or web address] URL: [email or web address]    ASSESSMENT:  CLINICAL IMPRESSION: 4/15 Pt warmed up on sci fit exercise bike for 6 min able to complete full revolutions with no increase in pain. Pt had increased knee soreness after being at the beach this past weekend. Adjusted treatment to not aggravate knee. PROM was performed and STM to quads and posterior knee. Quad sets were performed with BFR at . 1lb was added to quad sets which was tolerated well. BFR cuff was pumped up the whole time noting increased muscle soreness. Therapeutic activities were performed with no issues and good form. Walkouts were focusing on heel to toe foot patterns for community ambulation. Pt will continue to benefit from skilled physical therapy to progress MD protocol.     REHAB POTENTIAL: Good  CLINICAL DECISION MAKING: Stable/uncomplicated  EVALUATION COMPLEXITY: Low   GOALS: Goals reviewed with patient? Yes     SHORT TERM GOALS: goal date 4 wks post op  FWB without AD, able to ambulate household distances pain <=3/10 Baseline: see obj Goal status: Less pain walking around the house 3/14 2. ROM 0-90 without discomfort Baseline: Continues to have extension limitations 3/14 Goal status: achieved 4/7 3. Able to demo SLR without quad lag Baseline: see obj Goal status: No quad lag 4/7  LONG TERM GOALS: POC DATE   LEFS to at least 36/40 Baseline: see obj Goal status: INITIAL   2.  Able to navigate stairs with proper form Baseline: unable at eval Goal status: INITIAL   3.  MMT hip & knee to age-appropriate levels with hand held dyno Baseline: not appropriate to test at eval Goal status: INITIAL   4.  Single leg balance control, pain <2/10, on stable and unstable surfaces Baseline: unable at eval Goal status: INITIAL   5.  Prepared to return to  running & begin gentle plyometric program Baseline: will progress as appropraite Goal status: INITIAL   6.  Able to participate in play with kids with recognition of precautions Baseline:  Goal status: INITIAL     PLAN:  PT FREQUENCY: 1-2x/week  PT DURATION: 12 weeks  PLANNED INTERVENTIONS: 97164- PT Re-evaluation, 97110-Therapeutic exercises, 97530- Therapeutic activity, 97112- Neuromuscular re-education, 97535- Self Care, 16967- Manual therapy, [phone]- Gait training, [phone]- Aquatic Therapy, Patient/Family education, Balance training, Stair training, Taping, Dry Needling, Joint mobilization, Spinal mobilization, Scar mobilization, Cryotherapy, and Moist heat.  PLAN FOR NEXT SESSION: per Yvonne Hering Protocol (on Jess's desk)   Signa Drier PT DPT              I have reviewed  and concur with this student's documentation.   Katheran Palms, Student-PT 03/15/2024 10:47 AM  During this treatment session, the therapist was present, participating in and directing the treatment.   03/15/24 10:47 AM [facility] [address] [place], [place], [address] Phone: [phone]   Fax:  [phone]

## 2024-03-17 ENCOUNTER — Ambulatory Visit (HOSPITAL_BASED_OUTPATIENT_CLINIC_OR_DEPARTMENT_OTHER): Payer: 59 | Admitting: Physical Therapy

## 2024-03-17 ENCOUNTER — Encounter (HOSPITAL_BASED_OUTPATIENT_CLINIC_OR_DEPARTMENT_OTHER): Payer: Self-pay | Admitting: Physical Therapy

## 2024-03-17 DIAGNOSIS — R262 Difficulty in walking, not elsewhere classified: Secondary | ICD-10-CM | POA: Diagnosis not present

## 2024-03-17 DIAGNOSIS — M6281 Muscle weakness (generalized): Secondary | ICD-10-CM

## 2024-03-17 DIAGNOSIS — M25561 Pain in right knee: Secondary | ICD-10-CM

## 2024-03-17 NOTE — Therapy (Signed)
 OUTPATIENT PHYSICAL THERAPY TREATMENT   Patient Name: Betty Long [id number] MRN: [id number] DOB:[birth year], 38 y.o., female Today's Date: 03/17/2024  END OF SESSION:  PT End of Session - 03/17/24 0847     Visit Number 18    Number of Visits 25    Date for PT Re-Evaluation 03/26/24    Authorization Type MC    PT Start Time 0845    PT Stop Time 0928    PT Time Calculation (min) 43 min    Activity Tolerance Patient tolerated treatment well;No increased pain    Behavior During Therapy WFL for tasks assessed/performed                       Past Medical History:  Diagnosis Date   Family history of breast cancer    Family history of melanoma    History of abnormal cervical Pap smear    ASCUS  ---  NEGATIVE HPV   Mass of left knee    Vaginal Pap smear, abnormal    Past Surgical History:  Procedure Laterality Date   ANTERIOR CRUCIATE LIGAMENT REPAIR Left 2010   COSMETIC SURGERY     laceration repair   KNEE ARTHROSCOPY Left 02/09/2014   Procedure: LEFT KNEE EXCISIONAL BIOPSY;  Surgeon: Genevie Kerns, MD;  Location: [facility];  Service: Orthopedics;  Laterality: Left;   KNEE ARTHROSCOPY W/ LATERAL RELEASE Left 09-16-2011   PATELLA MALTRACKING   KNEE ARTHROSCOPY WITH MEDIAL MENISECTOMY Right 12/24/2023   Procedure: KNEE ARTHROSCOPY WITH MEDIAL MENISECTOMY;  Surgeon: Micheline Ahr, MD;  [facility];  Service: Orthopedics;  Laterality: Right;   MYRINGOTOMY     3mos 6 mos   TONSILLECTOMY  1993   Patient Active Problem List   Diagnosis Date Noted   Genetic testing 07/07/2021   Family history of breast cancer 06/21/2021   Family history of melanoma 06/21/2021   Indication for care in labor or delivery 10/22/2017   Indication for care in labor and delivery, antepartum 01/26/2015   NSVD (normal spontaneous vaginal delivery) 01/26/2015   S/P knee surgery 02/09/2014   Contraception management 05/12/2012   ASCUS on Pap smear  05/12/2012    REFERRING PROVIDER:  Micheline Ahr, MD     REFERRING DIAG: S/P right medial and lateral meniscus repair,ACL reconstruction   Rationale for Evaluation and Treatment: Rehabilitation  THERAPY DIAG:  Acute pain of right knee  Muscle weakness (generalized)  Difficulty in walking, not elsewhere classified  ONSET DATE: DOS 12/24/23   SUBJECTIVE:  SUBJECTIVE STATEMENT: 4/17 Patient reports he worked out on her own on Tuesday.  Tuesday night she had significant sharp pains in her knee.  She reports that it then resolved within the next day.  She reports no further issue.  She reports overall today is feeling pretty well. PERTINENT HISTORY:  Lt ACLR and lateral release  PAIN:  Are you having pain? Yes: NPRS scale: 2-3/10 Pain location: Rt knee Pain description: uncomfortable, tight Aggravating factors: constant Relieving factors: ice  PRECAUTIONS:  Other: see WB precautions  RED FLAGS: None   WEIGHT BEARING RESTRICTIONS:  Yes through 2/20: WBAT 0-90, ROM 0-90, no tibial rotation   FALLS:  Has patient fallen in last 6 months? 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  PATIENT SURVEYS:  LEFS 15  COGNITIVE STATUS: Within functional limits for tasks assessed   SENSATION: WFL  EDEMA:  Yes: moderate  GAIT: EVAL: with bil axillary crutches   Body Part #1 Knee  PALPATION: EVAL: Good patellar mobility, no s/s of infection  LOWER EXTREMITY ROM:     Passive  Right eval Right  2/4  3/3 3/6 3/10 3/27 4/7  Hip flexion           Hip extension          Hip abduction          Hip adduction          Hip internal rotation          Hip external rotation          Knee flexion 40 58 74 100 106 103  107 110.5  Knee extension -3 -2 -4  3 3   0    (Blank rows = not tested)                                                                                                                               TREATMENT DATE:  4/17 Manual: Grade 2 and 3 PA and AP mobilizations to improve extension.  Extension stretching with distraction.  There ex: Passive range of motion into flexion. Sci fit exercise bike (no increase in symptoms) Leg press 70 pounds 3 x 10  Neuro reEd: TRX squats 3 x 10 Eccentric stepdown 2 inch 3 x 10 good stability noted Lateral band walk  2 x 10 red  Backwards band walk 2 x 10 red Monster walk 2 x 10 red  Updated HEP   4/15 Manual: All PROM performed with distraction to reduce pain and improve movement PROM knee FLEX, EXT FLEX EXT mobs  grade 2 There-ex: Sci fit exercise bike (no increase in symptoms)  BFR: (leave pumped up when doing sets) Quad sets: 1lbs 20,15,15  1lbs RPE 5 Leg press 70lbs 3x10 There-Act Walkouts fwd bckwd 2x5 each way     4/10 Manual: PROM performed with distraction to reduce pain and improve movement PROM knee FLEX and EXT Patella mobilizations There-ex: BFR: (leave pumped up when doing sets) Quad sets:  20,15,15 1lbs RPE 5 There-Act Step ups w hip flexion 3x10 8in Standing marching 2x10 red RTB Squats 3x10    4/7 Manual: All PROM performed with distraction to reduce pain and improve movement PROM knee FLEX and EXT Patella mobilizations There-ex: BFR: Quad sets:  20,15,15 1lbs RPE 5  Leg press 3x10 65lbs RPE 5 Recumbent bike for ROM x76min     02/29/24  Manual:   PROM knee FLEX and EXT Patella mobilizations STM to medial HS  There-ex:   BFR:  Quad sets:   x30  SLR 3x10  Squats 3x10  Recumbent bike for ROM x19min   3/27 Manual: All PROM performed with distraction to reduce pain and improve movement PROM knee FLEX and EXT grade 1&2 STM to quads and IT band There-ex:  BFR: Quad sets:   1x20,15,15: RPE 6 SLR 3x10  There-Act Squats 3x10 RPE 5      PATIENT EDUCATION:  Education details: Teacher, music of condition, POC, HEP, exercise form/rationale Person educated: Patient Education method: Explanation, Demonstration, Tactile cues, Verbal cues,  Education comprehension: verbalized understanding, returned demonstration, verbal cues required, tactile cues required, and needs further education  HOME EXERCISE PROGRAM: Access Code: [email or web address] URL: [email or web address]    ASSESSMENT:  CLINICAL IMPRESSION:  The patient tolerated treatment well. We reviewed TRX deep squatting today. We also reviewed eccentric step downs. She had no significant increase in pain.  Therapy performed manual therapy on the patient's knee.  She continues to lack terminal knee extension.  She was advised to continue working on this at home.  When pushed endrange she had mild pinching in the front of her knee.  We reviewed what full knee hyperextension looks like.  We also added a band and walk series to her exercises.  We discussed how to progress that exercise.  Overall she is making good progress.  She will see the doctor next week.  Will figure out quad testing at that point.  REHAB POTENTIAL: Good  CLINICAL DECISION MAKING: Stable/uncomplicated  EVALUATION COMPLEXITY: Low   GOALS: Goals reviewed with patient? Yes     SHORT TERM GOALS: goal date 4 wks post op  FWB without AD, able to ambulate household distances pain <=3/10 Baseline: see obj Goal status: Less pain walking around the house 3/14 2. ROM 0-90 without discomfort Baseline: Continues to have extension limitations 3/14 Goal  status: achieved 4/7 3. Able to demo SLR without quad lag Baseline: see obj Goal status: No quad lag 4/7  LONG TERM GOALS: POC DATE   LEFS to at least 36/40 Baseline: see obj Goal status: INITIAL   2.  Able to navigate stairs with proper form Baseline: unable at eval Goal status: INITIAL   3.  MMT hip & knee to age-appropriate levels with hand held dyno Baseline: not appropriate to test at eval Goal status: INITIAL   4.  Single leg balance control, pain <2/10, on stable and unstable surfaces Baseline: unable at eval Goal status: INITIAL   5.  Prepared to return to running & begin gentle plyometric program Baseline: will progress as appropraite Goal status: INITIAL   6.  Able to participate  in play with kids with recognition of precautions Baseline:  Goal status: INITIAL     PLAN:  PT FREQUENCY: 1-2x/week  PT DURATION: 12 weeks  PLANNED INTERVENTIONS: 97164- PT Re-evaluation, 97110-Therapeutic exercises, 97530- Therapeutic activity, 97112- Neuromuscular re-education, 97535- Self Care, 16109- Manual therapy, [phone]- Gait training, [phone]- Aquatic Therapy, Patient/Family education, Balance training, Stair training, Taping, Dry Needling, Joint mobilization, Spinal mobilization, Scar mobilization, Cryotherapy, and Moist heat.  PLAN FOR NEXT SESSION: per Yvonne Hering Protocol (on Jess's desk)   Signa Drier PT DPT              I have reviewed and concur with this student's documentation.   Kitty Perkins, PT 03/17/2024 8:49 AM  During this treatment session, the therapist was present, participating in and directing the treatment.   03/17/24 8:49 AM [facility] [address] [place], [place], [address] Phone: [phone]   Fax:  [phone]

## 2024-03-22 ENCOUNTER — Other Ambulatory Visit: Payer: Self-pay | Admitting: Family

## 2024-03-22 ENCOUNTER — Ambulatory Visit
Admission: RE | Admit: 2024-03-22 | Discharge: 2024-03-22 | Disposition: A | Discharge status: Home / Self Care | Source: Ambulatory Visit | Attending: Family | Admitting: Family

## 2024-03-22 DIAGNOSIS — N6002 Solitary cyst of left breast: Secondary | ICD-10-CM

## 2024-03-22 DIAGNOSIS — N6012 Diffuse cystic mastopathy of left breast: Secondary | ICD-10-CM | POA: Diagnosis not present

## 2024-03-22 DIAGNOSIS — N6322 Unspecified lump in the left breast, upper inner quadrant: Secondary | ICD-10-CM | POA: Diagnosis not present

## 2024-03-22 DIAGNOSIS — N6320 Unspecified lump in the left breast, unspecified quadrant: Secondary | ICD-10-CM

## 2024-03-22 DIAGNOSIS — N644 Mastodynia: Secondary | ICD-10-CM

## 2024-03-23 ENCOUNTER — Ambulatory Visit
Admission: RE | Admit: 2024-03-23 | Discharge: 2024-03-23 | Disposition: A | Discharge status: Home / Self Care | Source: Ambulatory Visit | Attending: Family | Admitting: Family

## 2024-03-23 ENCOUNTER — Ambulatory Visit (HOSPITAL_COMMUNITY)

## 2024-03-23 ENCOUNTER — Other Ambulatory Visit: Payer: Self-pay | Admitting: Family

## 2024-03-23 DIAGNOSIS — N644 Mastodynia: Secondary | ICD-10-CM

## 2024-03-23 DIAGNOSIS — Z803 Family history of malignant neoplasm of breast: Secondary | ICD-10-CM | POA: Diagnosis not present

## 2024-03-23 DIAGNOSIS — N6002 Solitary cyst of left breast: Secondary | ICD-10-CM

## 2024-03-23 DIAGNOSIS — N6322 Unspecified lump in the left breast, upper inner quadrant: Secondary | ICD-10-CM | POA: Diagnosis not present

## 2024-03-23 DIAGNOSIS — N6320 Unspecified lump in the left breast, unspecified quadrant: Secondary | ICD-10-CM

## 2024-03-23 DIAGNOSIS — D242 Benign neoplasm of left breast: Secondary | ICD-10-CM | POA: Diagnosis not present

## 2024-03-23 HISTORY — PX: BREAST BIOPSY: SHX20

## 2024-03-24 LAB — SURGICAL PATHOLOGY

## 2024-04-01 ENCOUNTER — Encounter (HOSPITAL_BASED_OUTPATIENT_CLINIC_OR_DEPARTMENT_OTHER): Payer: Self-pay

## 2024-04-01 ENCOUNTER — Ambulatory Visit (HOSPITAL_BASED_OUTPATIENT_CLINIC_OR_DEPARTMENT_OTHER): Payer: Self-pay | Attending: Family

## 2024-04-01 DIAGNOSIS — M25561 Pain in right knee: Secondary | ICD-10-CM | POA: Insufficient documentation

## 2024-04-01 DIAGNOSIS — R262 Difficulty in walking, not elsewhere classified: Secondary | ICD-10-CM | POA: Insufficient documentation

## 2024-04-01 DIAGNOSIS — M6281 Muscle weakness (generalized): Secondary | ICD-10-CM | POA: Insufficient documentation

## 2024-04-01 NOTE — Therapy (Addendum)
 OUTPATIENT PHYSICAL THERAPY TREATMENT Progress Note Reporting Period 01/01/2024 to 03/26/2024  See note below for Objective Data and Assessment of Progress/Goals.      Patient Name: Betty Long [id number] MRN: [id number] DOB:1986-06-20, 38 y.o., female Today's Date: 04/01/2024  END OF SESSION:  PT End of Session - 04/01/24 0847     Visit Number 19    Number of Visits 36   Date for PT Re-Evaluation 06/03/2024   Authorization Type MC    PT Start Time 0847    PT Stop Time 0930    PT Time Calculation (min) 43 min    Activity Tolerance Patient tolerated treatment well    Behavior During Therapy WFL for tasks assessed/performed                        Past Medical History:  Diagnosis Date   Family history of breast cancer    Family history of melanoma    History of abnormal cervical Pap smear    ASCUS  ---  NEGATIVE HPV   Mass of left knee    Vaginal Pap smear, abnormal    Past Surgical History:  Procedure Laterality Date   ANTERIOR CRUCIATE LIGAMENT REPAIR Left 2010   BREAST BIOPSY Left 03/23/2024   US  LT BREAST BX W LOC DEV 1ST LESION IMG BX SPEC US  GUIDE 03/23/2024 [facility]   COSMETIC SURGERY     laceration repair   KNEE ARTHROSCOPY Left 02/09/2014   Procedure: LEFT KNEE EXCISIONAL BIOPSY;  Surgeon: Genevie Kerns, MD;  Location: [facility];  Service: Orthopedics;  Laterality: Left;   KNEE ARTHROSCOPY W/ LATERAL RELEASE Left 09-16-2011   PATELLA MALTRACKING   KNEE ARTHROSCOPY WITH MEDIAL MENISECTOMY Right 12/24/2023   Procedure: KNEE ARTHROSCOPY WITH MEDIAL MENISECTOMY;  Surgeon: Micheline Ahr, MD;  Location: [facility];  Service: Orthopedics;  Laterality: Right;   MYRINGOTOMY     3mos 6 mos   TONSILLECTOMY  1993   Patient Active Problem List   Diagnosis Date Noted   Genetic testing 07/07/2021   Family history of breast cancer 06/21/2021   Family history of melanoma 06/21/2021   Indication for care in labor or  delivery 10/22/2017   Indication for care in labor and delivery, antepartum 01/26/2015   NSVD (normal spontaneous vaginal delivery) 01/26/2015   S/P knee surgery 02/09/2014   Contraception management 05/12/2012   ASCUS on Pap smear 05/12/2012    REFERRING PROVIDER:  Micheline Ahr, MD     REFERRING DIAG: S/P right medial and lateral meniscus repair,ACL reconstruction   Rationale for Evaluation and Treatment: Rehabilitation  THERAPY DIAG:  Acute pain of right knee  Muscle weakness (generalized)  Difficulty in walking, not elsewhere classified  ONSET DATE: DOS 12/24/23   SUBJECTIVE:  SUBJECTIVE STATEMENT: Pt reports she continues to have a "tight band" feeling in R knee, especially with walking. Has resumed gentle work outs without complaint. No major complaint with work.   PERTINENT HISTORY:  Lt ACLR and lateral release  PAIN:  Are you having pain? Yes: NPRS scale: 4/10 Pain location: Rt knee Pain description: uncomfortable, tight Aggravating factors: constant Relieving factors: ice  PRECAUTIONS:  Other: see WB precautions  RED FLAGS: None   WEIGHT BEARING RESTRICTIONS:  Yes through 2/20: WBAT 0-90, ROM 0-90, no tibial rotation   FALLS:  Has patient fallen in last 6 months? 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  PATIENT SURVEYS:  LEFS 15  5/2: LEFS 50 COGNITIVE STATUS: Within functional limits for tasks assessed   SENSATION: WFL  EDEMA:  Yes: moderate  GAIT: EVAL: with bil axillary crutches   Body Part #1  Knee  PALPATION: EVAL: Good patellar mobility, no s/s of infection  LOWER EXTREMITY ROM:     Passive  Right eval Right  2/4  3/3 3/6 3/10 3/27 4/7 R 5/2 L 5/2  Hip flexion            Hip extension            Hip abduction            Hip adduction            Hip internal rotation            Hip external rotation            Knee flexion 40 58 74 100 106 103  107 110.5 124 143  Knee extension -3 -2 -4  3 3   0  -3 0   (Blank rows = not tested)   LOWER EXTREMITY MMT:    MMT Right 5/2 Left 5/2  Hip flexion    Hip extension    Hip abduction    Hip adduction    Hip internal rotation    Hip external rotation    Knee flexion 33.2 32.0  Knee extension 37.3 39.1  Ankle dorsiflexion    Ankle plantarflexion    Ankle inversion    Ankle eversion     (Blank rows = not tested)                                                                                                                             TREATMENT DATE:    5/2 Sci-fit bike L4 x12min Updated ROM and strength Reviewed goals LEFS Staggered bridges x10ea Long sit SLR x10ea Long sit HSS 30seconds SLS on floor x30seconds (with intermittent fingertip support) SLS on airex pad x30seconds (intermittent fingertip support) Updated HEP   4/17 Manual: Grade 2 and 3 PA  and AP mobilizations to improve extension.  Extension stretching with distraction.  There ex: Passive range of motion into flexion. Sci fit exercise bike (no increase in symptoms) Leg press 70 pounds 3 x 10  Neuro reEd: TRX squats 3 x 10 Eccentric stepdown 2 inch 3 x 10 good stability noted Lateral band walk  2 x 10 red  Backwards band walk 2 x 10 red Monster walk 2 x 10 red  Updated HEP   4/15 Manual: All PROM performed with distraction to reduce pain and improve movement PROM knee FLEX, EXT FLEX EXT mobs  grade 2 There-ex: Sci fit exercise bike (no increase in symptoms)  BFR: (leave pumped up when doing  sets) Quad sets: 1lbs 20,15,15 1lbs RPE 5 Leg press 70lbs 3x10 There-Act Walkouts fwd bckwd 2x5 each way     4/10 Manual: PROM performed with distraction to reduce pain and improve movement PROM knee FLEX and EXT Patella mobilizations There-ex: BFR: (leave pumped up when doing sets) Quad sets:  20,15,15 1lbs RPE 5 There-Act Step ups w hip flexion 3x10 8in Standing marching 2x10 red RTB Squats 3x10       PATIENT EDUCATION:  Education details: Teacher, music of condition, POC, HEP, exercise form/rationale Person educated: Patient Education method: Explanation, Demonstration, Tactile cues, Verbal cues,  Education comprehension: verbalized understanding, returned demonstration, verbal cues required, tactile cues required, and needs further education  HOME EXERCISE PROGRAM: Access Code: [email or web address] URL: [email or web address]    ASSESSMENT:  CLINICAL IMPRESSION:   Pt has attended 19 visits of PT thus far and has made steady progress towards goals. She has met 2/3 STG and 1/5 LTG. Updated ROM today with pt demonstrating mild extension deficit. Knee flexion is improved from previously measured, though deficit remains compared to L LE. Pt with near equal strength bilaterally using hand dynamometer, though weakness observed bilaterally as she was unable to hold long against therapist pressure. Assessed stability with SLS today. Pt was unable to maintain SLS on RLE for 30seconds without using railing on both compliant and stable surfaces. ADLs are improving including stair climbing. She does report significant difficulty with quadruped and kneeling positions which she has tried on her bed. Today pt was tender to palpation over medial aspect of knee and pes anserinus insertion with subsequent medial thigh tenderness. Instructed in self IASTM using roller at home to decrease tension here. Pt will benefit from continued PT to address ongoing deficits  and to progress towards dynamic tasks.     REHAB POTENTIAL: Good  CLINICAL DECISION MAKING: Stable/uncomplicated  EVALUATION COMPLEXITY: Low   GOALS: Goals reviewed with patient? Yes     SHORT TERM GOALS: goal date 4 wks post op  FWB without AD, able to ambulate household distances pain <=3/10 Baseline: see obj Goal status: Still having pain with walking 5/2 2. ROM 0-90 without discomfort Baseline: Continues to have extension limitations 3/14 Goal status: achieved 4/7 3. Able to demo SLR without quad lag Baseline: see obj Goal status: No quad lag 4/7  LONG TERM GOALS: POC DATE   LEFS to at least 36/40 Baseline: see obj Goal status: MET 5/2   2.  Able to navigate stairs with proper form Baseline: unable at eval Goal status: IN PROGRESS 5/2   3.  MMT hip & knee to age-appropriate levels with hand held dyno Baseline: not appropriate to test at eval Goal status: IN PROGRESS 5/2  4.  Single leg balance control, pain <  2/10, on stable and unstable surfaces Baseline: unable at eval Goal status: IN PROGRESS (mild instability 5/2)  5.  Prepared to return to running & begin gentle plyometric program Baseline: will progress as appropraite Goal status: NOT MET 5/2  6.  Able to participate in play with kids with recognition of precautions Baseline:  Goal status: In PROGRESS 5/2    PLAN:  PT FREQUENCY: 1-2x/week  PT DURATION: 12 weeks  PLANNED INTERVENTIONS: 97164- PT Re-evaluation, 97110-Therapeutic exercises, 97530- Therapeutic activity, 97112- Neuromuscular re-education, 97535- Self Care, 40981- Manual therapy, [phone]- Gait training, [phone]- Aquatic Therapy, Patient/Family education, Balance training, Stair training, Taping, Dry Needling, Joint mobilization, Spinal mobilization, Scar mobilization, Cryotherapy, and Moist heat.  PLAN FOR NEXT SESSION: per Yvonne Hering Protocol (on Jess's desk)    Judie Noun, PTA 04/01/2024 10:43 AM  04/01/24 10:43 AM [facility] [address] [place], [place], [address] Phone: [phone]   Fax:  [phone]  Therapy reviewed plan and agrees with plan. Patient would benefit from continued skilled therapy 2w8 to continue to progress strength and ROM.

## 2024-04-07 ENCOUNTER — Other Ambulatory Visit: Payer: Self-pay | Admitting: Family

## 2024-04-07 DIAGNOSIS — Z803 Family history of malignant neoplasm of breast: Secondary | ICD-10-CM

## 2024-04-07 DIAGNOSIS — N6019 Diffuse cystic mastopathy of unspecified breast: Secondary | ICD-10-CM

## 2024-04-07 DIAGNOSIS — R928 Other abnormal and inconclusive findings on diagnostic imaging of breast: Secondary | ICD-10-CM

## 2024-04-08 ENCOUNTER — Ambulatory Visit (HOSPITAL_BASED_OUTPATIENT_CLINIC_OR_DEPARTMENT_OTHER): Admitting: Physical Therapy

## 2024-04-08 ENCOUNTER — Encounter (HOSPITAL_BASED_OUTPATIENT_CLINIC_OR_DEPARTMENT_OTHER): Payer: Self-pay | Admitting: Physical Therapy

## 2024-04-08 DIAGNOSIS — R262 Difficulty in walking, not elsewhere classified: Secondary | ICD-10-CM | POA: Diagnosis not present

## 2024-04-08 DIAGNOSIS — M6281 Muscle weakness (generalized): Secondary | ICD-10-CM | POA: Diagnosis not present

## 2024-04-08 DIAGNOSIS — M25561 Pain in right knee: Secondary | ICD-10-CM | POA: Diagnosis not present

## 2024-04-08 NOTE — Addendum Note (Signed)
 Addended by: Kitty Perkins on: 04/08/2024 12:02 PM   Modules accepted: Orders

## 2024-04-08 NOTE — Therapy (Signed)
 OUTPATIENT PHYSICAL THERAPY TREATMENT Progress Note Reporting Period 01/01/2024 to 03/26/2024  See note below for Objective Data and Assessment of Progress/Goals.      Patient Name: Betty Long EXBMWUX MRN: [id number] DOB:1986/06/11, 38 y.o., female Today's Date: 04/08/2024  END OF SESSION:  PT End of Session - 04/08/24 0808     Visit Number 20    Number of Visits 25    Date for PT Re-Evaluation 03/26/24    Authorization Type MC    PT Start Time 0808    PT Stop Time 0848    PT Time Calculation (min) 40 min    Activity Tolerance Patient tolerated treatment well;No increased pain    Behavior During Therapy WFL for tasks assessed/performed                  Past Medical History:  Diagnosis Date   Family history of breast cancer    Family history of melanoma    History of abnormal cervical Pap smear    ASCUS  ---  NEGATIVE HPV   Mass of left knee    Vaginal Pap smear, abnormal    Past Surgical History:  Procedure Laterality Date   ANTERIOR CRUCIATE LIGAMENT REPAIR Left 2010   BREAST BIOPSY Left 03/23/2024   US  LT BREAST BX W LOC DEV 1ST LESION IMG BX SPEC US  GUIDE 03/23/2024 [facility]MOGRAP[facility]   COSMETIC SURGERY     laceration repair   KNEE ARTHROSCOPY Left 02/09/2014   Procedure: LEFT KNEE EXCISIONAL BIOPSY;  Surgeon: Genevie Kerns, MD;  Location: [facility];  Service: Orthopedics;  Laterality: Left;   KNEE ARTHROSCOPY W/ LATERAL RELEASE Left 09-16-2011   PATELLA MALTRACKING   KNEE ARTHROSCOPY WITH MEDIAL MENISECTOMY Right 12/24/2023   Procedure: KNEE ARTHROSCOPY WITH MEDIAL MENISECTOMY;  Surgeon: Micheline Ahr, MD;  Location: [facility];  Service: Orthopedics;  Laterality: Right;   MYRINGOTOMY     3mos 6 mos   TONSILLECTOMY  1993   Patient Active Problem List   Diagnosis Date Noted   Genetic testing 07/07/2021   Family history of breast cancer 06/21/2021   Family history of melanoma 06/21/2021   Indication for care in labor  or delivery 10/22/2017   Indication for care in labor and delivery, antepartum 01/26/2015   NSVD (normal spontaneous vaginal delivery) 01/26/2015   S/P knee surgery 02/09/2014   Contraception management 05/12/2012   ASCUS on Pap smear 05/12/2012    REFERRING PROVIDER:  Micheline Ahr, MD     REFERRING DIAG: S/P right medial and lateral meniscus repair,ACL reconstruction   Rationale for Evaluation and Treatment: Rehabilitation  THERAPY DIAG:  Acute pain of right knee  Muscle weakness (generalized)  Difficulty in walking, not elsewhere classified  ONSET DATE: DOS 12/24/23   SUBJECTIVE:  SUBJECTIVE STATEMENT: 5/9 Pt still feels like the knee is tight. Says she was exhausted during last visit and wants to do strength stuff again today.  PERTINENT HISTORY:  Lt ACLR and lateral release  PAIN:  Are you having pain? Yes: NPRS scale: 4/10 Pain location: Rt knee Pain description: uncomfortable, tight Aggravating factors: constant Relieving factors: ice  PRECAUTIONS:  Other: see WB precautions  RED FLAGS: None   WEIGHT BEARING RESTRICTIONS:  Yes through 2/20: WBAT 0-90, ROM 0-90, no tibial rotation   FALLS:  Has patient fallen in last 6 months? 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  PATIENT SURVEYS:  LEFS 15  5/2: LEFS 50 COGNITIVE STATUS: Within functional limits for tasks assessed   SENSATION: WFL  EDEMA:  Yes: moderate  GAIT: EVAL: with bil axillary crutches   Body Part #1 Knee  PALPATION: EVAL: Good patellar mobility, no s/s  of infection  LOWER EXTREMITY ROM:     Passive  Right eval Right  2/4  3/3 3/6 3/10 3/27 4/7 R 5/2 L 5/2  Hip flexion            Hip extension            Hip abduction            Hip adduction            Hip internal rotation            Hip external rotation            Knee flexion 40 58 74 100 106 103  107 110.5 124 143  Knee extension -3 -2 -4  3 3   0  -3 0   (Blank rows = not tested)   LOWER EXTREMITY MMT:    MMT Right 5/2 Left 5/2  Hip flexion    Hip extension    Hip abduction    Hip adduction    Hip internal rotation    Hip external rotation    Knee flexion 33.2 32.0  Knee extension 37.3 39.1  Ankle dorsiflexion    Ankle plantarflexion    Ankle inversion    Ankle eversion     (Blank rows = not tested)                                                                                                                             TREATMENT DATE:  5/9 Manual: All PROM performed with distraction to reduce pain and improve movement PROM ext mobs grade 3 PROM flex gapping grade 3 There-ex: Nustep lvl 5 SLR 2x10 4lbs Leg press 130lbs (next time do 150)     5/2 Sci-fit bike L4 x51min Updated ROM and strength Reviewed goals LEFS  Staggered bridges x10ea Long sit SLR x10ea Long sit HSS 30seconds SLS on floor x30seconds (with intermittent fingertip support) SLS on airex pad x30seconds (intermittent fingertip support) Updated HEP   4/17 Manual: Grade 2 and 3 PA and AP mobilizations to improve extension.  Extension stretching with distraction.  There ex: Passive range of motion into flexion. Sci fit exercise bike (no increase in symptoms) Leg press 70 pounds 3 x 10  Neuro reEd: TRX squats 3 x 10 Eccentric stepdown 2 inch 3 x 10 good stability noted Lateral band walk  2 x 10 red  Backwards band walk 2 x 10 red Monster walk 2 x 10 red  Updated HEP   4/15 Manual: All PROM performed with distraction to reduce  pain and improve movement PROM knee FLEX, EXT FLEX EXT mobs  grade 2 There-ex: Sci fit exercise bike (no increase in symptoms)  BFR: (leave pumped up when doing sets) Quad sets: 1lbs 20,15,15 1lbs RPE 5 Leg press 70lbs 3x10 There-Act Walkouts fwd bckwd 2x5 each way     4/10 Manual: PROM performed with distraction to reduce pain and improve movement PROM knee FLEX and EXT Patella mobilizations There-ex: BFR: (leave pumped up when doing sets) Quad sets:  20,15,15 1lbs RPE 5 There-Act Step ups w hip flexion 3x10 8in Standing marching 2x10 red RTB Squats 3x10       PATIENT EDUCATION:  Education details: Teacher, music of condition, POC, HEP, exercise form/rationale Person educated: Patient Education method: Explanation, Demonstration, Tactile cues, Verbal cues,  Education comprehension: verbalized understanding, returned demonstration, verbal cues required, tactile cues required, and needs further education  HOME EXERCISE PROGRAM: Access Code: [email or web address] URL: [email or web address]    ASSESSMENT:  CLINICAL IMPRESSION: 5/9 Pt warmed up on the nustep for with no increase in symptoms. Knee is still stiff. Manual therapy was performed doing mobilization in flexion and extension. Therapeutic exercises focused on LE strengthening. Pt reported a cold was coming on today. Pt will continue to benefit from skilled physical therapy to progress MD protocol for functional ADL's and independence.      REHAB POTENTIAL: Good  CLINICAL DECISION MAKING: Stable/uncomplicated  EVALUATION COMPLEXITY: Low   GOALS: Goals reviewed with patient? Yes     SHORT TERM GOALS: goal date 4 wks post op  FWB without AD, able to ambulate household distances pain <=3/10 Baseline: see obj Goal status: Still having pain with walking 5/2 2. ROM 0-90 without discomfort Baseline: Continues to have extension limitations 3/14 Goal  status: achieved 4/7 3. Able to demo SLR without quad lag Baseline: see obj Goal status: No quad lag 4/7  LONG TERM GOALS: POC DATE   LEFS to at least 36/40 Baseline: see obj Goal status: MET 5/2   2.  Able to navigate stairs with proper form Baseline: unable at eval Goal status: IN PROGRESS 5/2   3.  MMT hip & knee to age-appropriate levels with hand held dyno Baseline: not appropriate to test at eval Goal status: IN PROGRESS 5/2  4.  Single leg balance control, pain <2/10, on stable and unstable surfaces Baseline: unable at eval Goal status: IN PROGRESS (mild instability 5/2)  5.  Prepared to return to running & begin gentle plyometric program Baseline: will progress as appropraite Goal status: NOT MET 5/2  6.  Able to participate in play with kids with recognition of precautions Baseline:  Goal status: In PROGRESS 5/2    PLAN:  PT FREQUENCY:  1-2x/week  PT DURATION: 12 weeks  PLANNED INTERVENTIONS: 97164- PT Re-evaluation, 97110-Therapeutic exercises, 97530- Therapeutic activity, 97112- Neuromuscular re-education, 97535- Self Care, 16109- Manual therapy, [phone]- Gait training, [phone]- Aquatic Therapy, Patient/Family education, Balance training, Stair training, Taping, Dry Needling, Joint mobilization, Spinal mobilization, Scar mobilization, Cryotherapy, and Moist heat.  PLAN FOR NEXT SESSION: per Yvonne Hering Protocol (on Jess's desk)    Katheran Palms, Student-PT 04/08/2024 9:02 AM  04/08/24 9:02 AM [facility] [address] [place], [place], [address] Phone: [phone]   Fax:  [phone]

## 2024-04-14 ENCOUNTER — Encounter (HOSPITAL_BASED_OUTPATIENT_CLINIC_OR_DEPARTMENT_OTHER): Payer: Self-pay | Admitting: Physical Therapy

## 2024-04-14 ENCOUNTER — Ambulatory Visit (HOSPITAL_BASED_OUTPATIENT_CLINIC_OR_DEPARTMENT_OTHER): Admitting: Physical Therapy

## 2024-04-15 ENCOUNTER — Encounter (HOSPITAL_BASED_OUTPATIENT_CLINIC_OR_DEPARTMENT_OTHER): Payer: Self-pay | Admitting: Physical Therapy

## 2024-04-15 ENCOUNTER — Ambulatory Visit (HOSPITAL_BASED_OUTPATIENT_CLINIC_OR_DEPARTMENT_OTHER): Admitting: Physical Therapy

## 2024-04-15 DIAGNOSIS — M6281 Muscle weakness (generalized): Secondary | ICD-10-CM

## 2024-04-15 DIAGNOSIS — M25561 Pain in right knee: Secondary | ICD-10-CM

## 2024-04-15 DIAGNOSIS — R262 Difficulty in walking, not elsewhere classified: Secondary | ICD-10-CM

## 2024-04-15 NOTE — Therapy (Signed)
 OUTPATIENT PHYSICAL THERAPY TREATMENT Progress Note Reporting Period 01/01/2024 to 03/26/2024  See note below for Objective Data and Assessment of Progress/Goals.      Patient Name: Betty Long SWFUXNA MRN: [id number] DOB:September 06, 1986, 38 y.o., female Today's Date: 04/15/2024  END OF SESSION:  PT End of Session - 04/15/24 1308     Visit Number 21    Number of Visits 41    Date for PT Re-Evaluation 06/03/24    Authorization Type [facility]    PT Start Time 1300    PT Stop Time 1342    PT Time Calculation (min) 42 min    Activity Tolerance Patient tolerated treatment well;No increased pain    Behavior During Therapy WFL for tasks assessed/performed                   Past Medical History:  Diagnosis Date   Family history of breast cancer    Family history of melanoma    History of abnormal cervical Pap smear    ASCUS  ---  NEGATIVE HPV   Mass of left knee    Vaginal Pap smear, abnormal    Past Surgical History:  Procedure Laterality Date   ANTERIOR CRUCIATE LIGAMENT REPAIR Left 2010   BREAST BIOPSY Left 03/23/2024   US  LT BREAST BX W LOC DEV 1ST LESION IMG BX SPEC US  GUIDE 03/23/2024 [facility]AMMOGRAPHY   COSMETIC SURGERY     laceration repair   KNEE ARTHROSCOPY Left 02/09/2014   Procedure: LEFT KNEE EXCISIONAL BIOPSY;  Surgeon: Genevie Kerns, MD;  Location: [facility] [place];  Service: Orthopedics;  Laterality: Left;   KNEE ARTHROSCOPY W/ LATERAL RELEASE Left 09-16-2011   PATELLA MALTRACKING   KNEE ARTHROSCOPY WITH MEDIAL MENISECTOMY Right 12/24/2023   Procedure: KNEE ARTHROSCOPY WITH MEDIAL MENISECTOMY;  Surgeon: Micheline Ahr, MD;  Location: [facility];  Service: Orthopedics;  Laterality: Right;   MYRINGOTOMY     3mos 6 mos   TONSILLECTOMY  1993   Patient Active Problem List   Diagnosis Date Noted   Genetic testing 07/07/2021   Family history of breast cancer 06/21/2021   Family history of melanoma 06/21/2021   Indication for care in  labor or delivery 10/22/2017   Indication for care in labor and delivery, antepartum 01/26/2015   NSVD (normal spontaneous vaginal delivery) 01/26/2015   S/P knee surgery 02/09/2014   Contraception management 05/12/2012   ASCUS on Pap smear 05/12/2012    REFERRING PROVIDER:  Micheline Ahr, MD     REFERRING DIAG: S/P right medial and lateral meniscus repair,ACL reconstruction   Rationale for Evaluation and Treatment: Rehabilitation  THERAPY DIAG:  Acute pain of right knee  Muscle weakness (generalized)  Difficulty in walking, not elsewhere classified  ONSET DATE: DOS 12/24/23   SUBJECTIVE:  SUBJECTIVE STATEMENT: The patient has stiffness when she sits. She is otherwise doing well.   PERTINENT HISTORY:  Lt ACLR and lateral release  PAIN:  Are you having pain? Yes: NPRS scale: 4/10 Pain location: Rt knee Pain description: uncomfortable, tight Aggravating factors: constant Relieving factors: ice  PRECAUTIONS:  Other: see WB precautions  RED FLAGS: None   WEIGHT BEARING RESTRICTIONS:  Yes through 2/20: WBAT 0-90, ROM 0-90, no tibial rotation   FALLS:  Has patient fallen in last 6 months? 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  PATIENT SURVEYS:  LEFS 15  5/2: LEFS 50 COGNITIVE STATUS: Within functional limits for tasks assessed   SENSATION: WFL  EDEMA:  Yes: moderate  GAIT: EVAL: with bil axillary crutches   Body Part #1 Knee  PALPATION: EVAL: Good patellar mobility, no s/s of infection  LOWER EXTREMITY ROM:     Passive   Right eval Right  2/4  3/3 3/6 3/10 3/27 4/7 R 5/2 L 5/2  Hip flexion            Hip extension            Hip abduction            Hip adduction            Hip internal rotation            Hip external rotation            Knee flexion 40 58 74 100 106 103  107 110.5 124 143  Knee extension -3 -2 -4  3 3   0  -3 0   (Blank rows = not tested)   LOWER EXTREMITY MMT:    MMT Right 5/2 Left 5/2  Hip flexion    Hip extension    Hip abduction    Hip adduction    Hip internal rotation    Hip external rotation    Knee flexion 33.2 32.0  Knee extension 37.3 39.1  Ankle dorsiflexion    Ankle plantarflexion    Ankle inversion    Ankle eversion     (Blank rows = not tested)                                                                                                                             TREATMENT DATE:  5/16 Manual:  PROM ext mobs grade 3 PROM flex gapping grade 3 Trigger point release to the anterior hip   There-ex Dead left 26 lbs KB from 4 inch  Upright bike 5 min    Neuro re-ed:  TRX squat 3x10  Goblet squat watching posture in mirror 3x12 15 lbs  Eccentric strep down 4 inch 3x10  Step onto and off air-ex  2x10 fwd and lateral       5/9 Manual: All PROM performed with distraction to reduce pain and improve movement PROM ext mobs grade 3 PROM flex gapping grade 3 There-ex: Nustep lvl 5 SLR 2x10 4lbs Leg press 130lbs (next time do 150)     5/2 Sci-fit bike L4 x59min Updated ROM and strength Reviewed goals LEFS Staggered bridges x10ea Long sit SLR x10ea Long sit HSS 30seconds SLS on floor x30seconds (with intermittent fingertip support) SLS on airex pad x30seconds (intermittent fingertip support) Updated HEP   4/17 Manual: Grade 2 and 3 PA and AP mobilizations to improve extension.  Extension stretching with distraction.  There ex: Passive range of motion into flexion. Sci fit exercise bike (no increase in  symptoms) Leg press 70 pounds 3 x 10  Neuro reEd: TRX squats 3 x 10 Eccentric stepdown 2 inch 3 x 10 good stability noted Lateral band walk  2 x 10 red  Backwards band walk 2 x 10 red Monster walk 2 x 10 red  Updated HEP   4/15 Manual: All PROM performed with distraction to reduce pain and improve movement PROM knee FLEX, EXT FLEX EXT mobs  grade 2 There-ex: Sci fit exercise bike (no increase in symptoms)  BFR: (leave pumped up when doing sets) Quad sets: 1lbs 20,15,15 1lbs RPE 5 Leg press 70lbs 3x10 There-Act Walkouts fwd bckwd 2x5 each way     4/10 Manual: PROM performed with distraction to reduce pain and improve movement PROM knee FLEX and EXT Patella mobilizations There-ex: BFR: (leave pumped up when doing sets) Quad sets:  20,15,15 1lbs RPE 5 There-Act Step ups w hip flexion 3x10 8in Standing marching 2x10 red RTB Squats 3x10       PATIENT EDUCATION:  Education details: Teacher, music of condition, POC, HEP, exercise form/rationale Person educated: Patient Education method: Explanation, Demonstration, Tactile cues, Verbal cues,  Education comprehension: verbalized understanding, returned demonstration, verbal cues required, tactile cues required, and needs further education  HOME EXERCISE PROGRAM: Access Code: [email or web address] URL: [email or web address]    ASSESSMENT:  CLINICAL IMPRESSION: The patient is making good progress. She can feel it with the weights we are doing. We will continue to advance as we can. We continue to work on terminal knee extension. She was encouraged to keep working on it at home. We will test her for return to running next week. She showed great control with single leg step down.      REHAB POTENTIAL: Good  CLINICAL DECISION MAKING: Stable/uncomplicated  EVALUATION COMPLEXITY: Low   GOALS: Goals reviewed with patient? Yes     SHORT TERM GOALS:  goal date 4 wks post op  FWB without AD, able to ambulate household distances pain <=3/10 Baseline: see obj Goal status: Still having pain with walking 5/2 2. ROM 0-90 without discomfort Baseline: Continues to have extension limitations 3/14 Goal status: achieved 4/7 3. Able to demo SLR without quad lag Baseline: see obj Goal status: No quad lag 4/7  LONG TERM GOALS: POC DATE   LEFS to at least 36/40 Baseline: see obj Goal status: MET 5/2   2.  Able to navigate stairs with proper form Baseline: unable at eval Goal status: IN PROGRESS 5/2   3.  MMT hip & knee to age-appropriate levels with hand held dyno Baseline: not appropriate to test at eval Goal status: IN PROGRESS 5/2  4.  Single leg balance control, pain <  2/10, on stable and unstable surfaces Baseline: unable at eval Goal status: IN PROGRESS (mild instability 5/2)  5.  Prepared to return to running & begin gentle plyometric program Baseline: will progress as appropraite Goal status: NOT MET 5/2  6.  Able to participate in play with kids with recognition of precautions Baseline:  Goal status: In PROGRESS 5/2    PLAN:  PT FREQUENCY: 1-2x/week  PT DURATION: 12 weeks  PLANNED INTERVENTIONS: 97164- PT Re-evaluation, 97110-Therapeutic exercises, 97530- Therapeutic activity, 97112- Neuromuscular re-education, 97535- Self Care, 16109- Manual therapy, [phone]- Gait training, [phone]- Aquatic Therapy, Patient/Family education, Balance training, Stair training, Taping, Dry Needling, Joint mobilization, Spinal mobilization, Scar mobilization, Cryotherapy, and Moist heat.  PLAN FOR NEXT SESSION: per Yvonne Hering Protocol (on Jess's desk)    Kitty Perkins, PT 04/15/2024 1:10 PM  04/15/24 1:10 PM [facility] [address] [place], [place], [address] Phone: [phone]   Fax:  [phone]  `

## 2024-04-21 ENCOUNTER — Ambulatory Visit (HOSPITAL_BASED_OUTPATIENT_CLINIC_OR_DEPARTMENT_OTHER): Admitting: Physical Therapy

## 2024-04-21 ENCOUNTER — Encounter (HOSPITAL_BASED_OUTPATIENT_CLINIC_OR_DEPARTMENT_OTHER): Payer: Self-pay | Admitting: Physical Therapy

## 2024-04-21 DIAGNOSIS — M25561 Pain in right knee: Secondary | ICD-10-CM | POA: Diagnosis not present

## 2024-04-21 DIAGNOSIS — M6281 Muscle weakness (generalized): Secondary | ICD-10-CM

## 2024-04-21 DIAGNOSIS — R262 Difficulty in walking, not elsewhere classified: Secondary | ICD-10-CM

## 2024-04-21 NOTE — Therapy (Signed)
 OUTPATIENT PHYSICAL THERAPY TREATMENT Progress Note Reporting Period 01/01/2024 to 03/26/2024  See note below for Objective Data and Assessment of Progress/Goals.      Patient Name: Betty Long GEXBMWU MRN: [id number] DOB:[birth year], 38 y.o., female Today's Date: 04/21/2024  END OF SESSION:          Past Medical History:  Diagnosis Date   Family history of breast cancer    Family history of melanoma    History of abnormal cervical Pap smear    ASCUS  ---  NEGATIVE HPV   Mass of left knee    Vaginal Pap smear, abnormal    Past Surgical History:  Procedure Laterality Date   ANTERIOR CRUCIATE LIGAMENT REPAIR Left 2010   BREAST BIOPSY Left 03/23/2024   US  LT BREAST BX W LOC DEV 1ST LESION IMG BX SPEC US  GUIDE 03/23/2024 [facility]AMMOGRAPHY   COSMETIC SURGERY     laceration repair   KNEE ARTHROSCOPY Left 02/09/2014   Procedure: LEFT KNEE EXCISIONAL BIOPSY;  Surgeon: Genevie Kerns, MD;  Location: [facility];  Service: Orthopedics;  Laterality: Left;   KNEE ARTHROSCOPY W/ LATERAL RELEASE Left 09-16-2011   PATELLA MALTRACKING   KNEE ARTHROSCOPY WITH MEDIAL MENISECTOMY Right 12/24/2023   Procedure: KNEE ARTHROSCOPY WITH MEDIAL MENISECTOMY;  Surgeon: Micheline Ahr, MD;  Location: [facility];  Service: Orthopedics;  Laterality: Right;   MYRINGOTOMY     3mos 6 mos   TONSILLECTOMY  1993   Patient Active Problem List   Diagnosis Date Noted   Genetic testing 07/07/2021   Family history of breast cancer 06/21/2021   Family history of melanoma 06/21/2021   Indication for care in labor or delivery 10/22/2017   Indication for care in labor and delivery, antepartum 01/26/2015   NSVD (normal spontaneous vaginal delivery) 01/26/2015   S/P knee surgery 02/09/2014   Contraception management 05/12/2012   ASCUS on Pap smear 05/12/2012    REFERRING PROVIDER:  Micheline Ahr, MD     REFERRING DIAG: S/P right medial and lateral meniscus repair,ACL  reconstruction   Rationale for Evaluation and Treatment: Rehabilitation  THERAPY DIAG:  No diagnosis found.  ONSET DATE: DOS 12/24/23   SUBJECTIVE:                                                                                                                                                                                           SUBJECTIVE STATEMENT: The patient has stiffness when she sits. She is otherwise doing well.   PERTINENT HISTORY:  Lt ACLR and lateral release  PAIN:  Are you having pain? Yes: NPRS scale: 4/10 Pain location:  Rt knee Pain description: uncomfortable, tight Aggravating factors: constant Relieving factors: ice  PRECAUTIONS:  Other: see WB precautions  RED FLAGS: None   WEIGHT BEARING RESTRICTIONS:  Yes through 2/20: WBAT 0-90, ROM 0-90, no tibial rotation   FALLS:  Has patient fallen in last 6 months? 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  PATIENT SURVEYS:  LEFS 15  5/2: LEFS 50 COGNITIVE STATUS: Within functional limits for tasks assessed   SENSATION: WFL  EDEMA:  Yes: moderate  GAIT: EVAL: with bil axillary crutches   Body Part #1 Knee  PALPATION: EVAL: Good patellar mobility, no s/s of infection  LOWER EXTREMITY ROM:     Passive  Right eval Right  2/4  3/3 3/6 3/10 3/27 4/7 R 5/2 L 5/2  Hip flexion            Hip extension            Hip abduction            Hip adduction            Hip internal rotation            Hip external rotation            Knee flexion 40 58 74 100 106 103  107 110.5 124 143  Knee extension -3 -2 -4  3 3   0  -3 0   (Blank rows = not tested)   LOWER EXTREMITY MMT:    MMT Right 5/2 Left 5/2  Hip flexion    Hip extension    Hip abduction     Hip adduction    Hip internal rotation    Hip external rotation    Knee flexion 33.2 32.0  Knee extension 37.3 39.1  Ankle dorsiflexion    Ankle plantarflexion    Ankle inversion    Ankle eversion     (Blank rows = not tested)                                                    Tinq  L 90: 63.2 L 45   63.0  R: 90 64.9 R 45: 68.6                                                                              TREATMENT DATE:  5/22  There-ex: strength testing with tindq . Reviewed goals   Upright bike 5 min   Cybex leg press 90, 110 ,130 x12 each   LF leg extension 25 lbs 3x12   Gait: reviewed return to running ideas   5/16 Manual:  PROM ext mobs grade 3 PROM flex gapping grade 3 Trigger point release to the anterior hip   There-ex Dead left 26 lbs KB from 4 inch  Upright bike 5 min    Neuro  re-ed:  TRX squat 3x10  Goblet squat watching posture in mirror 3x12 15 lbs  Eccentric strep down 4 inch 3x10  Step onto and off air-ex 2x10 fwd and lateral       5/9 Manual: All PROM performed with distraction to reduce pain and improve movement PROM ext mobs grade 3 PROM flex gapping grade 3 There-ex: Nustep lvl 5 SLR 2x10 4lbs Leg press 130lbs (next time do 150)     5/2 Sci-fit bike L4 x106min Updated ROM and strength Reviewed goals LEFS Staggered bridges x10ea Long sit SLR x10ea Long sit HSS 30seconds SLS on floor x30seconds (with intermittent fingertip support) SLS on airex pad x30seconds (intermittent fingertip support) Updated HEP   4/17 Manual: Grade 2 and 3 PA and AP mobilizations to improve extension.  Extension stretching with distraction.  There ex: Passive range of motion into flexion. Sci fit exercise bike (no increase in symptoms) Leg press 70 pounds 3 x 10  Neuro reEd: TRX squats 3 x 10 Eccentric stepdown 2 inch 3 x 10 good stability noted Lateral band walk  2 x 10 red  Backwards band walk 2 x 10  red Monster walk 2 x 10 red  Updated HEP   4/15 Manual: All PROM performed with distraction to reduce pain and improve movement PROM knee FLEX, EXT FLEX EXT mobs  grade 2 There-ex: Sci fit exercise bike (no increase in symptoms)  BFR: (leave pumped up when doing sets) Quad sets: 1lbs 20,15,15 1lbs RPE 5 Leg press 70lbs 3x10 There-Act Walkouts fwd bckwd 2x5 each way     4/10 Manual: PROM performed with distraction to reduce pain and improve movement PROM knee FLEX and EXT Patella mobilizations There-ex: BFR: (leave pumped up when doing sets) Quad sets:  20,15,15 1lbs RPE 5 There-Act Step ups w hip flexion 3x10 8in Standing marching 2x10 red RTB Squats 3x10       PATIENT EDUCATION:  Education details: Teacher, music of condition, POC, HEP, exercise form/rationale Person educated: Patient Education method: Explanation, Demonstration, Tactile cues, Verbal cues,  Education comprehension: verbalized understanding, returned demonstration, verbal cues required, tactile cues required, and needs further education  HOME EXERCISE PROGRAM: Access Code: [email or web address] URL: [email or web address]    ASSESSMENT:  CLINICAL IMPRESSION: Therapy performed strength testing on the patient today. She did very well. Her surgical side was stronger then her non-surgical side. We discussed return to straight line running with her. She was advised to continue to progress her exercises in the gym. Over the next few visit we will add in plymometric strengthening.      REHAB POTENTIAL: Good  CLINICAL DECISION MAKING: Stable/uncomplicated  EVALUATION COMPLEXITY: Low   GOALS: Goals reviewed with patient? Yes     SHORT TERM GOALS: goal date 4 wks post op  FWB without AD, able to ambulate household distances pain <=3/10 Baseline: see obj Goal status: Still having pain with walking 5/2 2. ROM 0-90 without  discomfort Baseline: Continues to have extension limitations 3/14 Goal status: achieved 4/7 3. Able to demo SLR without quad lag Baseline: see obj Goal status: No quad lag 4/7  LONG TERM GOALS: POC DATE   LEFS to at least 36/40 Baseline: see obj Goal status: MET 5/2   2.  Able to navigate stairs with proper form Baseline: unable at eval Goal status: IN PROGRESS 5/2   3.  MMT hip & knee to age-appropriate levels with hand held dyno  Baseline: not appropriate to test at eval Goal status: IN PROGRESS 5/2  4.  Single leg balance control, pain <2/10, on stable and unstable surfaces Baseline: unable at eval Goal status: IN PROGRESS (mild instability 5/2)  5.  Prepared to return to running & begin gentle plyometric program Baseline: will progress as appropraite Goal status: NOT MET 5/2  6.  Able to participate in play with kids with recognition of precautions Baseline:  Goal status: In PROGRESS 5/2    PLAN:  PT FREQUENCY: 1-2x/week  PT DURATION: 12 weeks  PLANNED INTERVENTIONS: 97164- PT Re-evaluation, 97110-Therapeutic exercises, 97530- Therapeutic activity, 97112- Neuromuscular re-education, 97535- Self Care, 40981- Manual therapy, [phone]- Gait training, [phone]- Aquatic Therapy, Patient/Family education, Balance training, Stair training, Taping, Dry Needling, Joint mobilization, Spinal mobilization, Scar mobilization, Cryotherapy, and Moist heat.  PLAN FOR NEXT SESSION: per Yvonne Hering Protocol (on Jess's desk)    Kitty Perkins, PT 04/21/2024 12:59 PM  04/21/24 12:59 PM [facility] [address] [place], [place], [address] Phone: [phone]   Fax:  [phone]  `

## 2024-04-27 ENCOUNTER — Ambulatory Visit (HOSPITAL_BASED_OUTPATIENT_CLINIC_OR_DEPARTMENT_OTHER): Admitting: Physical Therapy

## 2024-04-27 ENCOUNTER — Encounter (HOSPITAL_BASED_OUTPATIENT_CLINIC_OR_DEPARTMENT_OTHER): Payer: Self-pay | Admitting: Physical Therapy

## 2024-04-27 DIAGNOSIS — M6281 Muscle weakness (generalized): Secondary | ICD-10-CM | POA: Diagnosis not present

## 2024-04-27 DIAGNOSIS — M25561 Pain in right knee: Secondary | ICD-10-CM | POA: Diagnosis not present

## 2024-04-27 DIAGNOSIS — R262 Difficulty in walking, not elsewhere classified: Secondary | ICD-10-CM | POA: Diagnosis not present

## 2024-04-27 NOTE — Therapy (Signed)
 OUTPATIENT PHYSICAL THERAPY TREATMENT Progress Note Reporting Period 01/01/2024 to 03/26/2024  See note below for Objective Data and Assessment of Progress/Goals.      Patient Name: Betty Long [id number] MRN: [id number] DOB:12/04/1985, 38 y.o., female Today's Date: 04/27/2024  END OF SESSION:  PT End of Session - 04/27/24 0936     Visit Number 23    Number of Visits 41    Date for PT Re-Evaluation 06/03/24    Authorization Type [facility]    PT Start Time 0931    PT Stop Time 1015    PT Time Calculation (min) 44 min    Activity Tolerance Patient tolerated treatment well;No increased pain    Behavior During Therapy WFL for tasks assessed/performed                    Past Medical History:  Diagnosis Date   Family history of breast cancer    Family history of melanoma    History of abnormal cervical Pap smear    ASCUS  ---  NEGATIVE HPV   Mass of left knee    Vaginal Pap smear, abnormal    Past Surgical History:  Procedure Laterality Date   ANTERIOR CRUCIATE LIGAMENT REPAIR Left 2010   BREAST BIOPSY Left 03/23/2024   US  LT BREAST BX W LOC DEV 1ST LESION IMG BX SPEC US  GUIDE 03/23/2024 [facility]AMMOGRAPHY   COSMETIC SURGERY     laceration repair   KNEE ARTHROSCOPY Left 02/09/2014   Procedure: LEFT KNEE EXCISIONAL BIOPSY;  Surgeon: Genevie Kerns, MD;  Location: [facility];  Service: Orthopedics;  Laterality: Left;   KNEE ARTHROSCOPY W/ LATERAL RELEASE Left 09-16-2011   PATELLA MALTRACKING   KNEE ARTHROSCOPY WITH MEDIAL MENISECTOMY Right 12/24/2023   Procedure: KNEE ARTHROSCOPY WITH MEDIAL MENISECTOMY;  Surgeon: Micheline Ahr, MD;  Location: [facility];  Service: Orthopedics;  Laterality: Right;   MYRINGOTOMY     3mos 6 mos   TONSILLECTOMY  1993   Patient Active Problem List   Diagnosis Date Noted   Genetic testing 07/07/2021   Family history of breast cancer 06/21/2021   Family history of melanoma 06/21/2021   Indication for care in  labor or delivery 10/22/2017   Indication for care in labor and delivery, antepartum 01/26/2015   NSVD (normal spontaneous vaginal delivery) 01/26/2015   S/P knee surgery 02/09/2014   Contraception management 05/12/2012   ASCUS on Pap smear 05/12/2012    REFERRING PROVIDER:  Micheline Ahr, MD     REFERRING DIAG: S/P right medial and lateral meniscus repair,ACL reconstruction   Rationale for Evaluation and Treatment: Rehabilitation  THERAPY DIAG:  Acute pain of right knee  Muscle weakness (generalized)  Difficulty in walking, not elsewhere classified  ONSET DATE: DOS 12/24/23   SUBJECTIVE:  SUBJECTIVE STATEMENT: The patient has stiffness when she sits. She is otherwise doing well.   PERTINENT HISTORY:  Lt ACLR and lateral release  PAIN:  Are you having pain? Yes: NPRS scale: 4/10 Pain location: Rt knee Pain description: uncomfortable, tight Aggravating factors: constant Relieving factors: ice  PRECAUTIONS:  Other: see WB precautions  RED FLAGS: None   WEIGHT BEARING RESTRICTIONS:  Yes through 2/20: WBAT 0-90, ROM 0-90, no tibial rotation   FALLS:  Has patient fallen in last 6 months? 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  PATIENT SURVEYS:  LEFS 15  5/2: LEFS 50 COGNITIVE STATUS: Within functional limits for tasks assessed   SENSATION: WFL  EDEMA:  Yes: moderate  GAIT: EVAL: with bil axillary crutches   Body Part #1 Knee  PALPATION: EVAL: Good patellar mobility, no s/s of infection  LOWER EXTREMITY ROM:     Passive   Right eval Right  2/4  3/3 3/6 3/10 3/27 4/7 R 5/2 L 5/2  Hip flexion            Hip extension            Hip abduction            Hip adduction            Hip internal rotation            Hip external rotation            Knee flexion 40 58 74 100 106 103  107 110.5 124 143  Knee extension -3 -2 -4  3 3   0  -3 0   (Blank rows = not tested)   LOWER EXTREMITY MMT:    MMT Right 5/2 Left 5/2  Hip flexion    Hip extension    Hip abduction    Hip adduction    Hip internal rotation    Hip external rotation    Knee flexion 33.2 32.0  Knee extension 37.3 39.1  Ankle dorsiflexion    Ankle plantarflexion    Ankle inversion    Ankle eversion     (Blank rows = not tested)                                                    Tinq  L 90: 63.2 L 45   63.0  R: 90 64.9 R 45: 68.6                                                                              TREATMENT DATE:     5/28 Manual:  PROM ext mobs grade 3 PROM flex gapping grade 3 Trigger point release to the anterior hip    There-act" Side shuffle 20'x4 Side shuffle with direction change 20'x4 Low skip 20'x4 [place]areoke step 20'x4  Neuro-re-ed  Quick step  Side to side hop  Fwd back hop   Squat jump  Bounding   There-ex:   SLR 3x10 2.5 lbs Ex bike 5 min L8    5/22  There-ex: strength testing with tindq . Reviewed goals   Upright bike 5 min   Cybex leg press 90, 110 ,130 x12 each   LF leg extension 25 lbs 3x12   Gait: reviewed return to running ideas   5/16 Manual:  PROM ext mobs grade 3 PROM flex gapping grade 3 Trigger point release to the anterior hip   There-ex Dead left 26 lbs KB from 4 inch  Upright bike 5 min    Neuro re-ed:  TRX squat 3x10  Goblet squat watching posture in mirror 3x12 15 lbs  Eccentric strep down 4 inch 3x10  Step onto and off air-ex 2x10 fwd and lateral       5/9 Manual: All PROM performed with distraction to reduce pain and improve  movement PROM ext mobs grade 3 PROM flex gapping grade 3 There-ex: Nustep lvl 5 SLR 2x10 4lbs Leg press 130lbs (next time do 150)     5/2 Sci-fit bike L4 x29min Updated ROM and strength Reviewed goals LEFS Staggered bridges x10ea Long sit SLR x10ea Long sit HSS 30seconds SLS on floor x30seconds (with intermittent fingertip support) SLS on airex pad x30seconds (intermittent fingertip support) Updated HEP   4/17 Manual: Grade 2 and 3 PA and AP mobilizations to improve extension.  Extension stretching with distraction.  There ex: Passive range of motion into flexion. Sci fit exercise bike (no increase in symptoms) Leg press 70 pounds 3 x 10  Neuro reEd: TRX squats 3 x 10 Eccentric stepdown 2 inch 3 x 10 good stability noted Lateral band walk  2 x 10 red  Backwards band walk 2 x 10 red Monster walk 2 x 10 red  Updated HEP   4/15 Manual: All PROM performed with distraction to reduce pain and improve movement PROM knee FLEX, EXT FLEX EXT mobs  grade 2 There-ex: Sci fit exercise bike (no increase in symptoms)  BFR: (leave pumped up when doing sets) Quad sets: 1lbs 20,15,15 1lbs RPE 5 Leg press 70lbs 3x10 There-Act Walkouts fwd bckwd 2x5 each way    PATIENT EDUCATION:  Education details: Anatomy of condition, POC, HEP, exercise form/rationale Person educated: Patient Education method: Explanation, Demonstration, Tactile cues, Verbal cues,  Education comprehension: verbalized understanding, returned demonstration, verbal cues required, tactile cues required, and needs further education  HOME EXERCISE PROGRAM: Access Code: [email or web address] URL: [email or web address]    ASSESSMENT:  CLINICAL IMPRESSION: The patient tolerated treatment well. We began basic plyometrics and movement drills that will improve her ability to run. She reported feeling decreased confidence with some of the drills but she  had no pain. We also started basic light 2 leg jumping. She hopes to get back to basketball. We emphasized the sound of her landing. She was advised if it hurts to hold off for right now. She has 2 more visits scheduled. She was advised we can take a month or so after for her to work on these drills then we will follow up.    REHAB POTENTIAL: Good  CLINICAL DECISION MAKING: Stable/uncomplicated  EVALUATION COMPLEXITY: Low   GOALS: Goals reviewed with patient? Yes     SHORT TERM GOALS: goal date 4 wks post op  FWB without AD, able to ambulate household distances pain <=3/10 Baseline: see  obj Goal status: Still having pain with walking 5/2 2. ROM 0-90 without discomfort Baseline: Continues to have extension limitations 3/14 Goal status: achieved 4/7 3. Able to demo SLR without quad lag Baseline: see obj Goal status: No quad lag 4/7  LONG TERM GOALS: POC DATE   LEFS to at least 36/40 Baseline: see obj Goal status: MET 5/2   2.  Able to navigate stairs with proper form Baseline: unable at eval Goal status: IN PROGRESS 5/2   3.  MMT hip & knee to age-appropriate levels with hand held dyno Baseline: not appropriate to test at eval Goal status: IN PROGRESS 5/2  4.  Single leg balance control, pain <2/10, on stable and unstable surfaces Baseline: unable at eval Goal status: IN PROGRESS (mild instability 5/2)  5.  Prepared to return to running & begin gentle plyometric program Baseline: will progress as appropraite Goal status: NOT MET 5/2  6.  Able to participate in play with kids with recognition of precautions Baseline:  Goal status: In PROGRESS 5/2    PLAN:  PT FREQUENCY: 1-2x/week  PT DURATION: 12 weeks  PLANNED INTERVENTIONS: 97164- PT Re-evaluation, 97110-Therapeutic exercises, 97530- Therapeutic activity, 97112- Neuromuscular re-education, 97535- Self Care, 96295- Manual therapy, [phone]- Gait training, [phone]- Aquatic Therapy, Patient/Family education,  Balance training, Stair training, Taping, Dry Needling, Joint mobilization, Spinal mobilization, Scar mobilization, Cryotherapy, and Moist heat.  PLAN FOR NEXT SESSION: per Yvonne Hering Protocol (on Jess's desk)    Kitty Perkins, PT 04/27/2024 9:36 AM  04/27/24 9:36 AM [facility] [address] [place], [place], [address] Phone: [phone]   Fax:  [phone]  `

## 2024-05-02 ENCOUNTER — Encounter (HOSPITAL_BASED_OUTPATIENT_CLINIC_OR_DEPARTMENT_OTHER): Payer: Self-pay | Admitting: Physical Therapy

## 2024-05-02 ENCOUNTER — Ambulatory Visit (HOSPITAL_BASED_OUTPATIENT_CLINIC_OR_DEPARTMENT_OTHER): Attending: Family | Admitting: Physical Therapy

## 2024-05-02 DIAGNOSIS — M6281 Muscle weakness (generalized): Secondary | ICD-10-CM | POA: Insufficient documentation

## 2024-05-02 DIAGNOSIS — R262 Difficulty in walking, not elsewhere classified: Secondary | ICD-10-CM | POA: Insufficient documentation

## 2024-05-02 DIAGNOSIS — M25561 Pain in right knee: Secondary | ICD-10-CM | POA: Diagnosis not present

## 2024-05-02 NOTE — Therapy (Signed)
 OUTPATIENT PHYSICAL THERAPY TREATMENT Progress Note Reporting Period 01/01/2024 to 03/26/2024  See note below for Objective Data and Assessment of Progress/Goals.      Patient Name: Betty Long ZOXWRUE MRN: [id number] DOB:06-24-1986, 38 y.o., female Today's Date: 05/03/2024  END OF SESSION:  PT End of Session - 05/03/24 1133     Visit Number 24    Number of Visits 41    Date for PT Re-Evaluation 06/03/24    PT Start Time 0935    PT Stop Time 1017    PT Time Calculation (min) 42 min    Activity Tolerance Patient tolerated treatment well;No increased pain    Behavior During Therapy WFL for tasks assessed/performed                     Past Medical History:  Diagnosis Date   Family history of breast cancer    Family history of melanoma    History of abnormal cervical Pap smear    ASCUS  ---  NEGATIVE HPV   Mass of left knee    Vaginal Pap smear, abnormal    Past Surgical History:  Procedure Laterality Date   ANTERIOR CRUCIATE LIGAMENT REPAIR Left 2010   BREAST BIOPSY Left 03/23/2024   US  LT BREAST BX W LOC DEV 1ST LESION IMG BX SPEC US  GUIDE 03/23/2024 [facility]AMMOGRAPHY   COSMETIC SURGERY     laceration repair   KNEE ARTHROSCOPY Left 02/09/2014   Procedure: LEFT KNEE EXCISIONAL BIOPSY;  Surgeon: Genevie Kerns, MD;  Location: [facility] [place];  Service: Orthopedics;  Laterality: Left;   KNEE ARTHROSCOPY W/ LATERAL RELEASE Left 09-16-2011   PATELLA MALTRACKING   KNEE ARTHROSCOPY WITH MEDIAL MENISECTOMY Right 12/24/2023   Procedure: KNEE ARTHROSCOPY WITH MEDIAL MENISECTOMY;  Surgeon: Micheline Ahr, MD;  Location: [facility];  Service: Orthopedics;  Laterality: Right;   MYRINGOTOMY     3mos 6 mos   TONSILLECTOMY  1993   Patient Active Problem List   Diagnosis Date Noted   Genetic testing 07/07/2021   Family history of breast cancer 06/21/2021   Family history of melanoma 06/21/2021   Indication for care in labor or delivery  10/22/2017   Indication for care in labor and delivery, antepartum 01/26/2015   NSVD (normal spontaneous vaginal delivery) 01/26/2015   S/P knee surgery 02/09/2014   Contraception management 05/12/2012   ASCUS on Pap smear 05/12/2012    REFERRING PROVIDER:  Micheline Ahr, MD     REFERRING DIAG: S/P right medial and lateral meniscus repair,ACL reconstruction   Rationale for Evaluation and Treatment: Rehabilitation  THERAPY DIAG:  Acute pain of right knee  Muscle weakness (generalized)  Difficulty in walking, not elsewhere classified  ONSET DATE: DOS 12/24/23   SUBJECTIVE:  SUBJECTIVE STATEMENT: The patient has stiffness when she sits. She is otherwise doing well.   PERTINENT HISTORY:  Lt ACLR and lateral release  PAIN:  Are you having pain? Yes: NPRS scale: 4/10 Pain location: Rt knee Pain description: uncomfortable, tight Aggravating factors: constant Relieving factors: ice  PRECAUTIONS:  Other: see WB precautions  RED FLAGS: None   WEIGHT BEARING RESTRICTIONS:  Yes through 2/20: WBAT 0-90, ROM 0-90, no tibial rotation   FALLS:  Has patient fallen in last 6 months? 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  PATIENT SURVEYS:  LEFS 15  5/2: LEFS 50 COGNITIVE STATUS: Within functional limits for tasks assessed   SENSATION: WFL  EDEMA:  Yes: moderate  GAIT: EVAL: with bil axillary crutches   Body Part #1 Knee  PALPATION: EVAL: Good patellar mobility, no s/s of infection  LOWER EXTREMITY ROM:     Passive  Right eval  Right  2/4  3/3 3/6 3/10 3/27 4/7 R 5/2 L 5/2  Hip flexion            Hip extension            Hip abduction            Hip adduction            Hip internal rotation            Hip external rotation            Knee flexion 40 58 74 100 106 103  107 110.5 124 143  Knee extension -3 -2 -4  3 3   0  -3 0   (Blank rows = not tested)   LOWER EXTREMITY MMT:    MMT Right 5/2 Left 5/2  Hip flexion    Hip extension    Hip abduction    Hip adduction    Hip internal rotation    Hip external rotation    Knee flexion 33.2 32.0  Knee extension 37.3 39.1  Ankle dorsiflexion    Ankle plantarflexion    Ankle inversion    Ankle eversion     (Blank rows = not tested)                                                    Tinq  L 90: 63.2 L 45   63.0  R: 90 64.9 R 45: 68.6                                                                              TREATMENT DATE:  6/3  There-ex:  Elliptical L3 5 min   There-act" Side shuffle 20'x4 Side shuffle with direction change 20'x4 Low skip 20'x4 Kareoke step 20'x4  Neuro-re-ed  Cable walk fwd and back 15 lbs 10x each   TRX squats 3x10   Goblet squats  2x15 15 lbs in the mirror.    5/28 Manual:  PROM ext mobs grade 3 PROM flex gapping grade 3 Trigger point release to the anterior hip    There-act" Side shuffle 20'x4 Side shuffle with direction change 20'x4 Low skip 20'x4 [place]oke step 20'x4  Neuro-re-ed  Quick step  Side to side hop  Fwd back hop   Squat jump  Bounding   There-ex:   SLR 3x10 2.5 lbs Ex bike 5 min L8    5/22  There-ex: strength testing with tindq . Reviewed goals   Upright bike 5 min   Cybex leg press 90, 110 ,130 x12 each   LF leg extension 25 lbs 3x12   Gait: reviewed return to running ideas   5/16 Manual:  PROM ext mobs grade 3 PROM flex gapping grade 3 Trigger point release to the anterior hip   There-ex Dead left 26 lbs KB from 4 inch  Upright bike 5 min    Neuro  re-ed:  TRX squat 3x10  Goblet squat watching posture in mirror 3x12 15 lbs  Eccentric strep down 4 inch 3x10  Step onto and off air-ex 2x10 fwd and lateral       5/9 Manual: All PROM performed with distraction to reduce pain and improve movement PROM ext mobs grade 3 PROM flex gapping grade 3 There-ex: Nustep lvl 5 SLR 2x10 4lbs Leg press 130lbs (next time do 150)     5/2 Sci-fit bike L4 x11min Updated ROM and strength Reviewed goals LEFS Staggered bridges x10ea Long sit SLR x10ea Long sit HSS 30seconds SLS on floor x30seconds (with intermittent fingertip support) SLS on airex pad x30seconds (intermittent fingertip support) Updated HEP   4/17 Manual: Grade 2 and 3 PA and AP mobilizations to improve extension.  Extension stretching with distraction.  There ex: Passive range of motion into flexion. Sci fit exercise bike (no increase in symptoms) Leg press 70 pounds 3 x 10  Neuro reEd: TRX squats 3 x 10 Eccentric stepdown 2 inch 3 x 10 good stability noted Lateral band walk  2 x 10 red  Backwards band walk 2 x 10 red Monster walk 2 x 10 red  Updated HEP   4/15 Manual: All PROM performed with distraction to reduce pain and improve movement PROM knee FLEX, EXT FLEX EXT mobs  grade 2 There-ex: Sci fit exercise bike (no increase in symptoms)  BFR: (leave pumped up when doing sets) Quad sets: 1lbs 20,15,15 1lbs RPE 5 Leg press 70lbs 3x10 There-Act Walkouts fwd bckwd 2x5 each way    PATIENT EDUCATION:  Education details: Anatomy of condition, POC, HEP, exercise form/rationale Person educated: Patient Education method: Explanation, Demonstration, Tactile cues, Verbal cues,  Education comprehension: verbalized understanding, returned demonstration, verbal cues required, tactile cues required, and needs further education  HOME EXERCISE PROGRAM: Access Code: [email or web address] URL:  [email or web address]    ASSESSMENT:  CLINICAL IMPRESSION: The patient was having minor pain at baseline today. We focused more on eccentric loading and base exercises as opposed to jumping more dynamic activity. She tolerated well. We will see her next week then likely see her again monthly or 4-6 weeks. We did review base warm up exercises.   REHAB POTENTIAL: Good  CLINICAL DECISION MAKING: Stable/uncomplicated  EVALUATION COMPLEXITY: Low   GOALS: Goals reviewed with patient? Yes     SHORT TERM GOALS: goal date 4 wks post op  FWB without AD, able to ambulate  household distances pain <=3/10 Baseline: see obj Goal status: Still having pain with walking 5/2 2. ROM 0-90 without discomfort Baseline: Continues to have extension limitations 3/14 Goal status: achieved 4/7 3. Able to demo SLR without quad lag Baseline: see obj Goal status: No quad lag 4/7  LONG TERM GOALS: POC DATE   LEFS to at least 36/40 Baseline: see obj Goal status: MET 5/2   2.  Able to navigate stairs with proper form Baseline: unable at eval Goal status: IN PROGRESS 5/2   3.  MMT hip & knee to age-appropriate levels with hand held dyno Baseline: not appropriate to test at eval Goal status: IN PROGRESS 5/2  4.  Single leg balance control, pain <2/10, on stable and unstable surfaces Baseline: unable at eval Goal status: IN PROGRESS (mild instability 5/2)  5.  Prepared to return to running & begin gentle plyometric program Baseline: will progress as appropraite Goal status: NOT MET 5/2  6.  Able to participate in play with kids with recognition of precautions Baseline:  Goal status: In PROGRESS 5/2    PLAN:  PT FREQUENCY: 1-2x/week  PT DURATION: 12 weeks  PLANNED INTERVENTIONS: 97164- PT Re-evaluation, 97110-Therapeutic exercises, 97530- Therapeutic activity, 97112- Neuromuscular re-education, 97535- Self Care, 16109- Manual therapy, [phone]- Gait training, [phone]- Aquatic  Therapy, Patient/Family education, Balance training, Stair training, Taping, Dry Needling, Joint mobilization, Spinal mobilization, Scar mobilization, Cryotherapy, and Moist heat.  PLAN FOR NEXT SESSION: per Yvonne Hering Protocol (on Jess's desk)    Kitty Perkins, PT 05/03/2024 11:39 AM  05/03/24 11:39 AM [facility] [address] [place], [place], [address] Phone: [phone]   Fax:  [phone]  `

## 2024-05-03 ENCOUNTER — Encounter (HOSPITAL_BASED_OUTPATIENT_CLINIC_OR_DEPARTMENT_OTHER): Payer: Self-pay | Admitting: Physical Therapy

## 2024-05-05 DIAGNOSIS — D242 Benign neoplasm of left breast: Secondary | ICD-10-CM | POA: Diagnosis not present

## 2024-05-09 ENCOUNTER — Ambulatory Visit (HOSPITAL_BASED_OUTPATIENT_CLINIC_OR_DEPARTMENT_OTHER): Admitting: Physical Therapy

## 2024-05-09 ENCOUNTER — Encounter (HOSPITAL_BASED_OUTPATIENT_CLINIC_OR_DEPARTMENT_OTHER): Payer: Self-pay | Admitting: Physical Therapy

## 2024-05-09 DIAGNOSIS — M6281 Muscle weakness (generalized): Secondary | ICD-10-CM | POA: Diagnosis not present

## 2024-05-09 DIAGNOSIS — R262 Difficulty in walking, not elsewhere classified: Secondary | ICD-10-CM | POA: Diagnosis not present

## 2024-05-09 DIAGNOSIS — M25561 Pain in right knee: Secondary | ICD-10-CM

## 2024-05-09 NOTE — Therapy (Signed)
 OUTPATIENT PHYSICAL THERAPY TREATMENT Progress Note Reporting Period 01/01/2024 to 03/26/2024  See note below for Objective Data and Assessment of Progress/Goals.      Patient Name: Betty Long ZOXWRUE MRN: [id number] DOB:29-Jan-1986, 38 y.o., female Today's Date: 05/09/2024  END OF SESSION:  PT End of Session - 05/09/24 0945     Visit Number 25    Number of Visits 41    Date for PT Re-Evaluation 06/03/24    PT Start Time 0932    PT Stop Time 1013    PT Time Calculation (min) 41 min    Activity Tolerance Patient tolerated treatment well;No increased pain    Behavior During Therapy WFL for tasks assessed/performed                     Past Medical History:  Diagnosis Date   Family history of breast cancer    Family history of melanoma    History of abnormal cervical Pap smear    ASCUS  ---  NEGATIVE HPV   Mass of left knee    Vaginal Pap smear, abnormal    Past Surgical History:  Procedure Laterality Date   ANTERIOR CRUCIATE LIGAMENT REPAIR Left 2010   BREAST BIOPSY Left 03/23/2024   US  LT BREAST BX W LOC DEV 1ST LESION IMG BX SPEC US  GUIDE 03/23/2024 [facility] MAMMOGRAP[facility]   COSMETIC SURGERY     laceration repair   KNEE ARTHROSCOPY Left 02/09/2014   Procedure: LEFT KNEE EXCISIONAL BIOPSY;  Surgeon: Genevie Kerns, MD;  Location: [facility] [place];  Service: Orthopedics;  Laterality: Left;   KNEE ARTHROSCOPY W/ LATERAL RELEASE Left 09-16-2011   PATELLA MALTRACKING   KNEE ARTHROSCOPY WITH MEDIAL MENISECTOMY Right 12/24/2023   Procedure: KNEE ARTHROSCOPY WITH MEDIAL MENISECTOMY;  Surgeon: Micheline Ahr, MD;  Location: [facility];  Service: Orthopedics;  Laterality: Right;   MYRINGOTOMY     3mos 6 mos   TONSILLECTOMY  1993   Patient Active Problem List   Diagnosis Date Noted   Genetic testing 07/07/2021   Family history of breast cancer 06/21/2021   Family history of melanoma 06/21/2021   Indication for care in labor or delivery  10/22/2017   Indication for care in labor and delivery, antepartum 01/26/2015   NSVD (normal spontaneous vaginal delivery) 01/26/2015   S/P knee surgery 02/09/2014   Contraception management 05/12/2012   ASCUS on Pap smear 05/12/2012    REFERRING PROVIDER:  Micheline Ahr, MD     REFERRING DIAG: S/P right medial and lateral meniscus repair,ACL reconstruction   Rationale for Evaluation and Treatment: Rehabilitation  THERAPY DIAG:  Acute pain of right knee  Muscle weakness (generalized)  Difficulty in walking, not elsewhere classified  ONSET DATE: DOS 12/24/23   SUBJECTIVE:  SUBJECTIVE STATEMENT: The patient has stiffness when she sits. She is otherwise doing well.   PERTINENT HISTORY:  Lt ACLR and lateral release  PAIN:  Are you having pain? Yes: NPRS scale: 4/10 Pain location: Rt knee Pain description: uncomfortable, tight Aggravating factors: constant Relieving factors: ice  PRECAUTIONS:  Other: see WB precautions  RED FLAGS: None   WEIGHT BEARING RESTRICTIONS:  Yes through 2/20: WBAT 0-90, ROM 0-90, no tibial rotation   FALLS:  Has patient fallen in last 6 months? 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  PATIENT SURVEYS:  LEFS 15  5/2: LEFS 50 COGNITIVE STATUS: Within functional limits for tasks assessed   SENSATION: WFL  EDEMA:  Yes: moderate  GAIT: EVAL: with bil axillary crutches   Body Part #1 Knee  PALPATION: EVAL: Good patellar mobility, no s/s of infection  LOWER EXTREMITY ROM:     Passive  Right eval  Right  2/4  3/3 3/6 3/10 3/27 4/7 R 5/2 L 5/2  Hip flexion            Hip extension            Hip abduction            Hip adduction            Hip internal rotation            Hip external rotation            Knee flexion 40 58 74 100 106 103  107 110.5 124 143  Knee extension -3 -2 -4  3 3   0  -3 0   (Blank rows = not tested)   LOWER EXTREMITY MMT:    MMT Right 5/2 Left 5/2  Hip flexion    Hip extension    Hip abduction    Hip adduction    Hip internal rotation    Hip external rotation    Knee flexion 33.2 32.0  Knee extension 37.3 39.1  Ankle dorsiflexion    Ankle plantarflexion    Ankle inversion    Ankle eversion     (Blank rows = not tested)                                                    Tinq  L 90: 63.2 L 45   63.0  R: 90 64.9 R 45: 68.6                                                                              TREATMENT DATE:  6/9 There-act" Side shuffle 20'x4 Side shuffle with direction change 20'x4 Change of speed with running 4 laps  Neuro-re-ed  Hop over hurdle 2x10  Side to side hop x20  Fwd back hop on air-ex x20   There-ex:  Cybex leg press 90, 110 ,  130 x12 each   LF leg extension 25 lbs 3x12     6/3  There-ex:  Elliptical L3 5 min   There-act" Side shuffle 20'x4 Side shuffle with direction change 20'x4 Low skip 20'x4 [place] step 20'x4  Neuro-re-ed  Cable walk fwd and back 15 lbs 10x each   TRX squats 3x10   Goblet squats 2x15 15 lbs in the mirror.    5/28 Manual:  PROM ext mobs grade 3 PROM flex gapping grade 3 Trigger point release to the anterior hip    There-act" Side shuffle 20'x4 Side shuffle with direction change 20'x4 Low skip 20'x4 [place] step 20'x4  Neuro-re-ed  Quick step  Side to side hop  Fwd back hop   Squat jump  Bounding   There-ex:   SLR 3x10 2.5 lbs Ex bike 5 min L8    5/22  There-ex: strength testing with tindq . Reviewed goals   Upright bike 5 min   Cybex leg  press 90, 110 ,130 x12 each   LF leg extension 25 lbs 3x12   Gait: reviewed return to running ideas   5/16 Manual:  PROM ext mobs grade 3 PROM flex gapping grade 3 Trigger point release to the anterior hip   There-ex Dead left 26 lbs KB from 4 inch  Upright bike 5 min    Neuro re-ed:  TRX squat 3x10  Goblet squat watching posture in mirror 3x12 15 lbs  Eccentric strep down 4 inch 3x10  Step onto and off air-ex 2x10 fwd and lateral       5/9 Manual: All PROM performed with distraction to reduce pain and improve movement PROM ext mobs grade 3 PROM flex gapping grade 3 There-ex: Nustep lvl 5 SLR 2x10 4lbs Leg press 130lbs (next time do 150)    PATIENT EDUCATION:  Education details: Anatomy of condition, POC, HEP, exercise form/rationale Person educated: Patient Education method: Explanation, Demonstration, Tactile cues, Verbal cues,  Education comprehension: verbalized understanding, returned demonstration, verbal cues required, tactile cues required, and needs further education  HOME EXERCISE PROGRAM: Access Code: [email or web address] URL: [email or web address]    ASSESSMENT:  CLINICAL IMPRESSION: The patient had soreness a few days ago but showed no sign at this time. We reviewed exercise concepts for the next 4-6 weeks. She will train her movements drills and fillow up if needed. We will re-assess plan in 4-6 weeks otherwise.  REHAB POTENTIAL: Good  CLINICAL DECISION MAKING: Stable/uncomplicated  EVALUATION COMPLEXITY: Low   GOALS: Goals reviewed with patient? Yes     SHORT TERM GOALS: goal date 4 wks post op  FWB without AD, able to ambulate household distances pain <=3/10 Baseline: see obj Goal status: Still having pain with walking 5/2 2. ROM 0-90 without discomfort Baseline: Continues to have extension limitations 3/14 Goal status: achieved 4/7 3. Able to demo SLR without quad lag Baseline: see obj Goal status:  No quad lag 4/7  LONG TERM GOALS: POC DATE   LEFS to at least 36/40 Baseline: see obj Goal status: MET 5/2   2.  Able to navigate stairs with proper form Baseline: unable at eval Goal status: IN PROGRESS 5/2   3.  MMT hip & knee to age-appropriate levels with hand held dyno Baseline: not appropriate to test at eval Goal status: achieved 6/9 4.  Single leg balance control, pain <2/10, on stable and unstable surfaces Baseline: unable at eval Goal status: IN PROGRESS (mild instability 5/2)  5.  Prepared to return to  running & begin gentle plyometric program Baseline: will progress as appropraite Goal status: NOT MET 5/2  6.  Able to participate in play with kids with recognition of precautions Baseline:  Goal status: In progressing 6/9    PLAN:  PT FREQUENCY: 1-2x/week  PT DURATION: 12 weeks  PLANNED INTERVENTIONS: 97164- PT Re-evaluation, 97110-Therapeutic exercises, 97530- Therapeutic activity, 97112- Neuromuscular re-education, 97535- Self Care, 52841- Manual therapy, [phone]- Gait training, [phone]- Aquatic Therapy, Patient/Family education, Balance training, Stair training, Taping, Dry Needling, Joint mobilization, Spinal mobilization, Scar mobilization, Cryotherapy, and Moist heat.  PLAN FOR NEXT SESSION: per Yvonne Hering Protocol (on Jess's desk)    Kitty Perkins, PT 05/09/2024 9:50 AM  05/09/24 9:50 AM [facility] [address] [place], [place], [address] Phone: [phone]   Fax:  [phone]  `

## 2024-05-20 ENCOUNTER — Telehealth: Payer: Self-pay

## 2024-05-20 NOTE — Telephone Encounter (Signed)
 Verbally confirmed appt for 6/23

## 2024-05-23 ENCOUNTER — Encounter: Payer: Self-pay | Admitting: Hematology and Oncology

## 2024-05-23 ENCOUNTER — Inpatient Hospital Stay: Attending: Hematology and Oncology | Admitting: Hematology and Oncology

## 2024-05-23 VITALS — BP 118/66 | HR 81 | Temp 98.7°F | Resp 16 | Ht 68.0 in | Wt 146.2 lb

## 2024-05-23 DIAGNOSIS — Z9189 Other specified personal risk factors, not elsewhere classified: Secondary | ICD-10-CM | POA: Insufficient documentation

## 2024-05-23 DIAGNOSIS — N6009 Solitary cyst of unspecified breast: Secondary | ICD-10-CM | POA: Diagnosis not present

## 2024-05-23 DIAGNOSIS — Z803 Family history of malignant neoplasm of breast: Secondary | ICD-10-CM | POA: Diagnosis not present

## 2024-05-23 DIAGNOSIS — R9230 Dense breasts, unspecified: Secondary | ICD-10-CM | POA: Diagnosis not present

## 2024-05-23 DIAGNOSIS — Z808 Family history of malignant neoplasm of other organs or systems: Secondary | ICD-10-CM | POA: Insufficient documentation

## 2024-05-23 NOTE — Progress Notes (Signed)
  Cancer Center CONSULT NOTE  Patient Care Team: Jason Leita Repine, FNP as PCP - General (Internal Medicine)  CHIEF COMPLAINTS/PURPOSE OF CONSULTATION:  Newly diagnosed breast cancer  HISTORY OF PRESENTING ILLNESS:  Betty Long 38 y.o. female is here because of high risk for breast cancer.  I reviewed her records extensively and collaborated the history with the patient.  SUMMARY OF ONCOLOGIC HISTORY: Oncology History   No history exists.   Discussed the use of AI scribe software for clinical note transcription with the patient, who gave verbal consent to proceed.  History of Present Illness Betty Long is a 38 year old female who presents for evaluation at the high risk breast clinic. She was referred by a breast surgeon for evaluation due to family history and dense breast tissue.  She has a significant family history of breast cancer, with her maternal grandmother diagnosed in her thirties. Her maternal grandfather had melanoma, but there are no other known cases of breast cancer in her immediate family, including her mother and maternal aunts. Genetic testing was negative for BRCA mutations.  She has undergone a prior biopsy which revealed benign results, although she has multiple cysts in her breast. She feels another lump similar to the one previously biopsied, located in a different area of the breast.  She has a history of knee surgeries due to sports-related injuries, including ACL tears from activities such as racquetball, basketball, and softball. Her most recent ACL surgery was in January.  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 She breastfed her two older children for extended periods. She does not take any regular medications except for PRN Tylenol  and has no history of hormone replacement therapy or birth control use. No Ashkenazi Jewish  heritage.   MEDICAL HISTORY:  Past Medical History:  Diagnosis Date   Family history of breast cancer    Family history of melanoma    History of abnormal cervical Pap smear    ASCUS  ---  NEGATIVE HPV   Mass of left knee    Vaginal Pap smear, abnormal     SURGICAL HISTORY: Past Surgical History:  Procedure Laterality Date   ANTERIOR CRUCIATE LIGAMENT REPAIR Left 2010   BREAST BIOPSY Left 03/23/2024   US  LT BREAST BX W LOC DEV 1ST LESION IMG BX SPEC US  GUIDE 03/23/2024 [facility]AMMOGRAP[facility]   COSMETIC SURGERY     laceration repair   KNEE ARTHROSCOPY Left 02/09/2014   Procedure: LEFT KNEE EXCISIONAL BIOPSY;  Surgeon: Lamar Collet, MD;  Location: [facility];  Service: Orthopedics;  Laterality: Left;   KNEE ARTHROSCOPY W/ LATERAL RELEASE Left 09-16-2011   PATELLA MALTRACKING   KNEE ARTHROSCOPY WITH MEDIAL MENISECTOMY Right 12/24/2023   Procedure: KNEE ARTHROSCOPY WITH MEDIAL MENISECTOMY;  Surgeon: Cristy Bonner DASEN, MD;  Location: [facility];  Service: Orthopedics;  Laterality: Right;   MYRINGOTOMY     3mos 6 mos   TONSILLECTOMY  1993    SOCIAL HISTORY: Social History   Socioeconomic History   Marital status: Married    Spouse name: Toribio   Number of children: Not on file   Years of education: Not on file   Highest education level: Not on file  Occupational History   Not on file  Tobacco Use   Smoking status: Never   Smokeless tobacco: Never  Vaping Use   Vaping status: Never Used  Substance and Sexual  Activity   Alcohol use: No   Drug use: No   Sexual activity: Not on file  Other Topics Concern   Not on file  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[employer]   Social Drivers of Health   Financial Resource Strain: Not on file  Food Insecurity: No Food Insecurity (05/23/2024)   Hunger Vital Sign    Worried About Running Out of Food in the Last Year: Never true    Ran Out of Food in the Last Year: Never true  Transportation Needs: No  Transportation Needs (05/23/2024)   PRAPARE - Administrator, Civil Service (Medical): No    Lack of Transportation (Non-Medical): No  Physical Activity: Not on file  Stress: Not on file  Social Connections: Unknown (04/15/2022)   Received from [facility]   Social Network    Social Network: Not on file  Intimate Partner Violence: Not At Risk (05/23/2024)   Humiliation, Afraid, Rape, and Kick questionnaire    Fear of Current or Ex-Partner: No    Emotionally Abused: No    Physically Abused: No    Sexually Abused: No    FAMILY HISTORY: Family History  Problem Relation Age of Onset   Anemia Mother    Hypertension Father    Melanoma Father 45       ear   Hypertension Brother    Melanoma Maternal Aunt    Breast cancer Maternal Grandmother 57   Melanoma Maternal Grandfather        dx 50s/60s, metastatic    ALLERGIES:  is allergic to percocet [oxycodone -acetaminophen ].  MEDICATIONS:  No current outpatient medications on file.   No current facility-administered medications for this visit.    All other systems were reviewed with the patient and are negative.  PHYSICAL EXAMINATION: ECOG PERFORMANCE STATUS: 0 - Asymptomatic  Vitals:   05/23/24 1050  BP: 118/66  Pulse: 81  Resp: 16  Temp: 98.7 F (37.1 C)  SpO2: 100%   Filed Weights   05/23/24 1050  Weight: 146 lb 3.2 oz (66.3 kg)    GENERAL:alert, no distress and comfortable Neck: No cervical adenopathy Breasts: Bilateral breasts inspected and palpated, In left breast, there is a palpable area, sub cm adjacent to nipple at 3 0 clock position. Pt is not sure if this is something she already mentioned to her breast surgeon. Otherwise very dense breasts. No regional adenopathy Area which was biopsied is left breast 10 0 clock 2cmfn  LABORATORY DATA:  I have reviewed the data as listed Lab Results  Component Value Date   WBC 7.7 04/09/2023   HGB 14.0 04/09/2023   HCT 41.7 04/09/2023   MCV 89.7  04/09/2023   PLT 261.0 04/09/2023   Lab Results  Component Value Date   NA 138 04/09/2023   K 4.4 04/09/2023   CL 104 04/09/2023   CO2 24 04/09/2023    RADIOGRAPHIC STUDIES: I have personally reviewed the radiological reports and agreed with the findings in the report.  ASSESSMENT AND PLAN:  Assessment & Plan  At high risk of breast cancer Dense breast tissue obscures mammogram results. Family history includes maternal grandmother with breast cancer. Tyrer-Cuzick model shows 17.8% lifetime risk, below MRI insurance threshold, however since its almost 20 % and she has extremely dense breasts, we recommended MRI screening for better visualization. Risk-reducing mastectomy not indicated due to negative genetic testing. - Order breast MRI , expected date about 2 weeks. - Stagger MRI and mammogram screenings, MRI now,  mammogram in winter. - Provide lifestyle counseling on diet, exercise, stress management, and sleep.  Breast cysts Multiple breast cysts with benign biopsy findings. New palpable firm area post-biopsy requires further investigation. MRI will assess this area, biopsy if suspicious. - Evaluate new palpable area with upcoming MRI. - Consider biopsy if MRI indicates suspicious findings.  Pt was instructed to call us  if she doesn't hear back from the MRI scheduling. All questions were answered. The patient knows to call the clinic with any problems, questions or concerns.    Amber Stalls, MD 05/25/24

## 2024-06-06 ENCOUNTER — Other Ambulatory Visit: Payer: Self-pay | Admitting: Hematology and Oncology

## 2024-06-06 ENCOUNTER — Ambulatory Visit
Admission: RE | Admit: 2024-06-06 | Discharge: 2024-06-06 | Disposition: A | Discharge status: Home / Self Care | Source: Ambulatory Visit | Attending: Hematology and Oncology | Admitting: Hematology and Oncology

## 2024-06-06 DIAGNOSIS — N6012 Diffuse cystic mastopathy of left breast: Secondary | ICD-10-CM | POA: Diagnosis not present

## 2024-06-06 DIAGNOSIS — N6011 Diffuse cystic mastopathy of right breast: Secondary | ICD-10-CM | POA: Diagnosis not present

## 2024-06-06 DIAGNOSIS — N6320 Unspecified lump in the left breast, unspecified quadrant: Secondary | ICD-10-CM

## 2024-06-06 DIAGNOSIS — R928 Other abnormal and inconclusive findings on diagnostic imaging of breast: Secondary | ICD-10-CM | POA: Diagnosis not present

## 2024-06-06 DIAGNOSIS — Z9189 Other specified personal risk factors, not elsewhere classified: Secondary | ICD-10-CM

## 2024-06-06 MED ORDER — GADOPICLENOL 0.5 MMOL/ML IV SOLN
6.0000 mL | Freq: Once | INTRAVENOUS | Status: AC | PRN
  Administered 2024-06-06: 6 mL via INTRAVENOUS

## 2024-06-07 ENCOUNTER — Other Ambulatory Visit: Payer: Self-pay | Admitting: *Deleted

## 2024-06-07 ENCOUNTER — Encounter: Payer: Self-pay | Admitting: *Deleted

## 2024-06-07 DIAGNOSIS — R9389 Abnormal findings on diagnostic imaging of other specified body structures: Secondary | ICD-10-CM

## 2024-06-07 DIAGNOSIS — Z9189 Other specified personal risk factors, not elsewhere classified: Secondary | ICD-10-CM

## 2024-06-08 ENCOUNTER — Ambulatory Visit (HOSPITAL_BASED_OUTPATIENT_CLINIC_OR_DEPARTMENT_OTHER): Attending: Family | Admitting: Physical Therapy

## 2024-06-08 ENCOUNTER — Emergency Department (HOSPITAL_COMMUNITY)

## 2024-06-08 ENCOUNTER — Other Ambulatory Visit: Payer: Self-pay

## 2024-06-08 ENCOUNTER — Encounter (HOSPITAL_BASED_OUTPATIENT_CLINIC_OR_DEPARTMENT_OTHER): Payer: Self-pay

## 2024-06-08 ENCOUNTER — Emergency Department (HOSPITAL_COMMUNITY): Admission: EM | Admit: 2024-06-08 | Discharge: 2024-06-08 | Disposition: A | Discharge status: Home / Self Care

## 2024-06-08 DIAGNOSIS — R002 Palpitations: Secondary | ICD-10-CM | POA: Diagnosis not present

## 2024-06-08 DIAGNOSIS — R059 Cough, unspecified: Secondary | ICD-10-CM | POA: Insufficient documentation

## 2024-06-08 DIAGNOSIS — M6281 Muscle weakness (generalized): Secondary | ICD-10-CM | POA: Insufficient documentation

## 2024-06-08 DIAGNOSIS — I498 Other specified cardiac arrhythmias: Secondary | ICD-10-CM | POA: Diagnosis not present

## 2024-06-08 DIAGNOSIS — M25561 Pain in right knee: Secondary | ICD-10-CM | POA: Insufficient documentation

## 2024-06-08 DIAGNOSIS — R262 Difficulty in walking, not elsewhere classified: Secondary | ICD-10-CM | POA: Insufficient documentation

## 2024-06-08 DIAGNOSIS — R0789 Other chest pain: Secondary | ICD-10-CM | POA: Diagnosis not present

## 2024-06-08 LAB — BASIC METABOLIC PANEL WITH GFR
Anion gap: 11 (ref 5–15)
BUN: 11 mg/dL (ref 6–20)
CO2: 20 mmol/L — ABNORMAL LOW (ref 22–32)
Calcium: 9.7 mg/dL (ref 8.9–10.3)
Chloride: 107 mmol/L (ref 98–111)
Creatinine, Ser: 0.76 mg/dL (ref 0.44–1.00)
GFR, Estimated: >60 mL/min (ref >60)
Glucose, Bld: 102 mg/dL — ABNORMAL HIGH (ref 70–99)
Potassium: 4 mmol/L (ref 3.5–5.1)
Sodium: 138 mmol/L (ref 135–145)

## 2024-06-08 LAB — CBC
HCT: 42.5 % (ref 36.0–46.0)
Hemoglobin: 14.1 g/dL (ref 12.0–15.0)
MCH: 30.1 pg (ref 26.0–34.0)
MCHC: 33.2 g/dL (ref 30.0–36.0)
MCV: 90.8 fL (ref 80.0–100.0)
Platelets: 237 K/uL (ref 150–400)
RBC: 4.68 MIL/uL (ref 3.87–5.11)
RDW: 13.3 % (ref 11.5–15.5)
WBC: 6.7 K/uL (ref 4.0–10.5)
nRBC: 0 % (ref 0.0–0.2)

## 2024-06-08 LAB — TSH: TSH: 1.508 u[IU]/mL (ref 0.350–4.500)

## 2024-06-08 LAB — TROPONIN I (HIGH SENSITIVITY): Troponin I (High Sensitivity): <2 ng/L (ref <18)

## 2024-06-08 LAB — HCG, SERUM, QUALITATIVE: Preg, Serum: NEGATIVE

## 2024-06-08 LAB — MAGNESIUM: Magnesium: 2.1 mg/dL (ref 1.7–2.4)

## 2024-06-08 NOTE — ED Provider Notes (Signed)
 Betty Long, Betty Long, Betty Chest Pain   Betty Long is a 38 y.o. female.   38 year old female presents for evaluation of heart fluttering.  States when it occurs it comes in clusters Betty comes Betty goes.  States it is stopped at this time.  She states what happens it makes her feel like she has to Betty Long.  Also causes some discomfort in her chest.  She states she has been more stressed lately Betty does work night shifts Betty has been drinking an increased amount of coffee.  Denies any other symptoms or concerns at this time.   Betty Long Associated symptoms: chest pain   Associated symptoms: no chills, no ear pain, no fever, no rash, no shortness of breath Betty no sore throat   Chest Pain Associated symptoms: Betty Long   Associated symptoms: no abdominal pain, no back pain, no fever, no palpitations, no shortness of breath Betty no vomiting        Prior to Admission medications   Not on File    Allergies: Percocet [oxycodone -acetaminophen ]    Review of Systems  Constitutional:  Negative for chills Betty fever.  HENT:  Negative for ear pain Betty sore throat.   Eyes:  Negative for pain Betty visual disturbance.  Respiratory:  Positive for Betty Long. Negative for shortness of breath.   Cardiovascular:  Positive for chest pain. Negative for palpitations.  Gastrointestinal:  Negative for abdominal pain Betty vomiting.  Genitourinary:  Negative for dysuria Betty hematuria.  Musculoskeletal:  Negative for arthralgias Betty back pain.  Skin:  Negative for color change Betty rash.  Neurological:  Negative for seizures Betty syncope.  All other systems reviewed Betty are negative.   Updated Vital Signs BP 114/67   Pulse 68   Temp 97.7 F (36.5 C) (Oral)   Resp 17   SpO2 98%   Physical Exam Vitals Betty nursing note reviewed.  Constitutional:       General: She is not in acute distress.    Appearance: She is well-developed.  HENT:     Head: Normocephalic Betty atraumatic.  Eyes:     Conjunctiva/sclera: Conjunctivae normal.  Cardiovascular:     Rate Betty Rhythm: Normal rate Betty regular rhythm.     Heart sounds: Normal heart sounds. No murmur heard. Pulmonary:     Effort: Pulmonary effort is normal. No respiratory distress.     Breath sounds: Normal breath sounds. No decreased breath sounds or wheezing.  Abdominal:     Palpations: Abdomen is soft.     Tenderness: There is no abdominal tenderness.  Musculoskeletal:        General: No swelling.     Cervical back: Neck supple.  Skin:    General: Skin is warm Betty dry.     Capillary Refill: Capillary refill takes less than 2 seconds.  Neurological:     Mental Status: She is alert.  Psychiatric:        Mood Betty Affect: Mood normal.     (all labs ordered are listed, but only abnormal results are displayed) Labs Reviewed  BASIC METABOLIC PANEL WITH GFR - Abnormal; Notable for the following components:      Result Value   CO2 20 (*)    Glucose, Bld 102 (*)    All other components within normal limits  CBC  HCG, SERUM, QUALITATIVE  MAGNESIUM  TSH  TROPONIN I (HIGH SENSITIVITY)  TROPONIN I (HIGH SENSITIVITY)    EKG: EKG Interpretation Date/Time:  Wednesday June 08 2024 08:21:44 EDT Ventricular Rate:  84 PR Interval:  135 QRS Duration:  74 QT Interval:  364 QTC Calculation: 431 R Axis:   82  Text Interpretation: Sinus rhythm Left ventricular hypertrophy No significant change when compared to prior EKG from 03/09/2022 Confirmed by Gennaro Bouchard ([id number]) on 06/08/2024 8:25:57 AM  Radiology: [facility] Chest 2 View Result Date: 06/08/2024 CLINICAL DATA:  heart Long EXAM: CHEST - 2 VIEW COMPARISON:  None available. FINDINGS: No focal airspace consolidation, pleural effusion, or pneumothorax. No cardiomegaly.No acute fracture or destructive lesion. Multilevel thoracic osteophytosis.  IMPRESSION: No acute cardiopulmonary abnormality. Electronically Signed   By: Rogelia Myers M.D.   On: 06/08/2024 08:59     Procedures   Medications Ordered in the ED - No data to display                                  Medical Decision Making Medical Decision Making Nursing notes are reviewed. Differential diagnosis for this patient would include but not limited to: PVCs, arrhythmia, electrolyte abnormality, anxiety, other  Cardiac monitor interpretation: Sinus rhythm, occasional PVCs  Emergency Department Course:  Vital signs Betty pulse oximetry are reviewed, evaluated by myself Betty found to be within normal limits prior to final disposition. Findings of laboratory testing Betty medical imaging are discussed with patient Betty family that is available. Patient agrees with the medical care plan as follows:  Patient here for palpitations she is likely having ectopic atrial beats or occasional PVCs.  She otherwise appears well with stable vital signs.  Discussed results with her.  She will plan to follow-up with primary care doctor Betty discuss with them a Holter monitor.  Advise return for new or worsening symptoms.  Feels comfortable with the plan be discharged home.  Was offered fluids Betty medication in the ER Betty she declines.  Problems Addressed: Palpitations: undiagnosed new problem with uncertain prognosis  Amount Betty/or Complexity of Data Reviewed External Data Reviewed: notes.    Details: Outpatient records reviewed Betty patient is being worked up for breast abnormality with MRI Labs: ordered. Decision-making details documented in ED Course.    Details: Ordered Betty reviewed by me Betty show no acute abnormality, troponin is negative magnesium TSH CBC Betty BMP Betty are within normal limits Radiology: ordered Betty independent interpretation performed. Decision-making details documented in ED Course.    Details: Ordered Betty interpreted by me independently of radiology Chest x-ray  shows no acute disease process in the chest ECG/medicine tests: ordered Betty independent interpretation performed. Decision-making details documented in ED Course.    Details: Ordered Betty interpreted by me in the absence of cardiology Betty shows sinus rhythm, no obesity or PVCs or arrhythmia, no acute abnormality Betty no previous for comparison  Risk OTC drugs.    Final diagnoses:  Palpitations    ED Discharge Orders     None          Gennaro Bouchard CROME, DO 06/08/24 1456

## 2024-06-08 NOTE — Discharge Instructions (Addendum)
 Call and follow-up with your primary care doctor and discussed with them a Holter monitor and cardiology referral.  Return to the ER for new or worsening symptoms.

## 2024-06-08 NOTE — ED Triage Notes (Signed)
 Pt. Stated, since Sunday, started having heart beat with a pause. It happens especially if Im upright standing.  Pt is the NICU pharmacist. When it happens it causes me to cough. Denies any other symptoms

## 2024-06-09 ENCOUNTER — Other Ambulatory Visit (HOSPITAL_BASED_OUTPATIENT_CLINIC_OR_DEPARTMENT_OTHER): Payer: Self-pay

## 2024-06-09 ENCOUNTER — Other Ambulatory Visit: Payer: Self-pay | Admitting: Family

## 2024-06-09 MED ORDER — METOPROLOL SUCCINATE ER 25 MG PO TB24
25.0000 mg | ORAL_TABLET | Freq: Every day | ORAL | 0 refills | Status: DC
  Filled 2024-06-09: qty 90, 90d supply, fill #0

## 2024-06-15 ENCOUNTER — Ambulatory Visit
Admission: RE | Admit: 2024-06-15 | Discharge: 2024-06-15 | Disposition: A | Discharge status: Home / Self Care | Source: Ambulatory Visit | Attending: Hematology and Oncology | Admitting: Hematology and Oncology

## 2024-06-15 ENCOUNTER — Other Ambulatory Visit: Payer: Self-pay | Admitting: Hematology and Oncology

## 2024-06-15 ENCOUNTER — Ambulatory Visit
Admission: RE | Admit: 2024-06-15 | Discharge: 2024-06-15 | Disposition: A | Discharge status: Home / Self Care | Source: Ambulatory Visit | Attending: Hematology and Oncology

## 2024-06-15 DIAGNOSIS — N6320 Unspecified lump in the left breast, unspecified quadrant: Secondary | ICD-10-CM

## 2024-06-15 DIAGNOSIS — D242 Benign neoplasm of left breast: Secondary | ICD-10-CM | POA: Diagnosis not present

## 2024-06-15 DIAGNOSIS — N6315 Unspecified lump in the right breast, overlapping quadrants: Secondary | ICD-10-CM | POA: Diagnosis not present

## 2024-06-15 DIAGNOSIS — N6012 Diffuse cystic mastopathy of left breast: Secondary | ICD-10-CM | POA: Diagnosis not present

## 2024-06-15 DIAGNOSIS — N6325 Unspecified lump in the left breast, overlapping quadrants: Secondary | ICD-10-CM | POA: Diagnosis not present

## 2024-06-15 HISTORY — PX: BREAST BIOPSY: SHX20

## 2024-06-16 ENCOUNTER — Other Ambulatory Visit (HOSPITAL_BASED_OUTPATIENT_CLINIC_OR_DEPARTMENT_OTHER): Payer: Self-pay

## 2024-06-16 ENCOUNTER — Other Ambulatory Visit: Payer: Self-pay

## 2024-06-16 ENCOUNTER — Encounter: Payer: Self-pay | Admitting: Family

## 2024-06-16 ENCOUNTER — Ambulatory Visit (INDEPENDENT_AMBULATORY_CARE_PROVIDER_SITE_OTHER): Admitting: Family

## 2024-06-16 VITALS — BP 118/76 | HR 70 | Ht 68.0 in | Wt 144.2 lb

## 2024-06-16 DIAGNOSIS — R002 Palpitations: Secondary | ICD-10-CM

## 2024-06-16 LAB — SURGICAL PATHOLOGY

## 2024-06-16 MED ORDER — ALPRAZOLAM 0.25 MG PO TABS
0.2500 mg | ORAL_TABLET | Freq: Two times a day (BID) | ORAL | 0 refills | Status: DC | PRN
  Filled 2024-06-16 (×2): qty 20, 10d supply, fill #0

## 2024-06-16 MED ORDER — ALPRAZOLAM 0.25 MG PO TABS
0.2500 mg | ORAL_TABLET | Freq: Two times a day (BID) | ORAL | 0 refills | Status: DC | PRN
  Filled 2024-06-16: qty 20, 10d supply, fill #0

## 2024-06-16 NOTE — Progress Notes (Signed)
 Betty Long is a 38 y.o. female with the following history as recorded in EpicCare:  Patient Active Problem List   Diagnosis Date Noted   At increased risk of breast cancer 05/23/2024   Genetic testing 07/07/2021   Family history of breast cancer 06/21/2021   Family history of melanoma 06/21/2021   Indication for care in labor or delivery 10/22/2017   Indication for care in labor and delivery, antepartum 01/26/2015   NSVD (normal spontaneous vaginal delivery) 01/26/2015   S/P knee surgery 02/09/2014   Contraception management 05/12/2012   ASCUS on Pap smear 05/12/2012    Current Outpatient Medications  Medication Sig Dispense Refill   ALPRAZolam  (XANAX ) 0.25 MG tablet Take 1 tablet (0.25 mg total) by mouth 2 (two) times daily as needed for anxiety. 20 tablet 0   metoprolol  succinate (TOPROL -XL) 25 MG 24 hr tablet Take 1 tablet (25 mg total) by mouth daily. (Patient not taking: Reported on 06/16/2024) 90 tablet 0   No current facility-administered medications for this visit.    Allergies: Percocet [oxycodone -acetaminophen ]  Past Medical History:  Diagnosis Date   Family history of breast cancer    Family history of melanoma    History of abnormal cervical Pap smear    ASCUS  ---  NEGATIVE HPV   Mass of left knee    Vaginal Pap smear, abnormal     Past Surgical History:  Procedure Laterality Date   ANTERIOR CRUCIATE LIGAMENT REPAIR Left 2010   BREAST BIOPSY Left 03/23/2024   US  LT BREAST BX W LOC DEV 1ST LESION IMG BX SPEC US  GUIDE 03/23/2024 [facility]   BREAST BIOPSY Left 06/15/2024   US  LT BREAST BX W LOC DEV 1ST LESION IMG BX SPEC US  GUIDE 06/15/2024 [facility]   BREAST BIOPSY Left 06/15/2024   MM LT BREAST BX W LOC DEV 1ST LESION IMAGE BX SPEC STEREO GUIDE 06/15/2024 [facility]   COSMETIC SURGERY     laceration repair   KNEE ARTHROSCOPY Left 02/09/2014   Procedure: LEFT KNEE EXCISIONAL BIOPSY;  Surgeon: Lamar Collet, MD;  Location: [facility];  Service: Orthopedics;  Laterality: Left;   KNEE ARTHROSCOPY W/ LATERAL RELEASE Left 09-16-2011   PATELLA MALTRACKING   KNEE ARTHROSCOPY WITH MEDIAL MENISECTOMY Right 12/24/2023   Procedure: KNEE ARTHROSCOPY WITH MEDIAL MENISECTOMY;  Surgeon: Cristy Bonner DASEN, MD;  Location: [facility];  Service: Orthopedics;  Laterality: Right;   MYRINGOTOMY     3mos 6 mos   TONSILLECTOMY  1993    Family History  Problem Relation Age of Onset   Anemia Mother    Hypertension Father    Melanoma Father 102       ear   Hypertension Brother    Melanoma Maternal Aunt    Breast cancer Maternal Grandmother 44   Melanoma Maternal Grandfather        dx 50s/60s, metastatic    Social History   Tobacco Use   Smoking status: Never   Smokeless tobacco: Never  Substance Use Topics   Alcohol use: No    Subjective:   Patient was seen in ER last week with episode of palpitations- thought to be PVCs/ holter monitor discussed/ ? Situational anxiety; Has been struggling with recurrent breast cysts- will be reaching out to her breast surgeon to discuss possible mastectomy; did have pending breast biopsy at time of ER visit;    Objective:  Vitals:   06/16/24 0907  BP: 118/76  Pulse: 70  SpO2:  100%  Weight: 144 lb 3.2 oz (65.4 kg)  Height: 5' 8 (1.727 m)    General: Well developed, well nourished, in no acute distress  Skin : Warm and dry.  Head: Normocephalic and atraumatic  Lungs: Respirations unlabored; clear to auscultation bilaterally without wheeze, rales, rhonchi  CVS exam: normal rate and regular rhythm.  Neurologic: Alert and oriented; speech intact; face symmetrical; moves all extremities well; CNII-XII intact without focal deficit   Assessment:  1. Palpitations     Plan:  Refer to cardiology to discuss holter monitor and echocardiogram;   Patient will also call her breast surgeon to schedule follow up sooner than planned to discuss possible mastectomy;   No  follow-ups on file.  Orders Placed This Encounter  Procedures   Ambulatory referral to Cardiology    Referral Priority:   Routine    Referral Type:   Consultation    Referral Reason:   Specialty Services Required    Referred to Provider:   Bernie Lamar PARAS, MD    Number of Visits Requested:   1    Requested Prescriptions   Signed Prescriptions Disp Refills   ALPRAZolam  (XANAX ) 0.25 MG tablet 20 tablet 0    Sig: Take 1 tablet (0.25 mg total) by mouth 2 (two) times daily as needed for anxiety.

## 2024-06-21 ENCOUNTER — Inpatient Hospital Stay: Attending: Hematology and Oncology | Admitting: Hematology and Oncology

## 2024-06-21 DIAGNOSIS — D249 Benign neoplasm of unspecified breast: Secondary | ICD-10-CM | POA: Diagnosis not present

## 2024-06-21 DIAGNOSIS — N62 Hypertrophy of breast: Secondary | ICD-10-CM | POA: Diagnosis not present

## 2024-06-21 DIAGNOSIS — R9230 Dense breasts, unspecified: Secondary | ICD-10-CM | POA: Diagnosis not present

## 2024-06-21 DIAGNOSIS — Z9189 Other specified personal risk factors, not elsewhere classified: Secondary | ICD-10-CM | POA: Diagnosis not present

## 2024-06-21 DIAGNOSIS — R9389 Abnormal findings on diagnostic imaging of other specified body structures: Secondary | ICD-10-CM

## 2024-06-21 NOTE — Progress Notes (Signed)
 [facility] CONSULT NOTE  Patient Care Team: Jason Leita Repine, FNP as PCP - General (Internal Medicine)  CHIEF COMPLAINTS/PURPOSE OF CONSULTATION:  Newly diagnosed breast cancer  HISTORY OF PRESENTING ILLNESS:  Betty Long 38 y.o. female is here because of high risk for breast cancer.  I reviewed her records extensively and collaborated the history with the patient.  SUMMARY OF ONCOLOGIC HISTORY: Oncology History   No history exists.   Discussed the use of AI scribe software for clinical note transcription with the patient, who gave verbal consent to proceed.  She is here for a telephone visit.  Since her last visit here she had a mammogram and ultrasound as well as a biopsy which showed tubular adenoma and usual ductal hyperplasia.  She expresses worry about having to review the imaging every 6 months and how things could be still missed.  She is wondering if she needs to be considered for prophylactic bilateral mastectomy.  She is agreeable to doing intensive screening in the meanwhile.  No other concerning symptoms today.  MEDICAL HISTORY:  Past Medical History:  Diagnosis Date   Family history of breast cancer    Family history of melanoma    History of abnormal cervical Pap smear    ASCUS  ---  NEGATIVE HPV   Mass of left knee    Vaginal Pap smear, abnormal     SURGICAL HISTORY: Past Surgical History:  Procedure Laterality Date   ANTERIOR CRUCIATE LIGAMENT REPAIR Left 2010   BREAST BIOPSY Left 03/23/2024   US  LT BREAST BX W LOC DEV 1ST LESION IMG BX SPEC US  GUIDE 03/23/2024 [facility]   BREAST BIOPSY Left 06/15/2024   US  LT BREAST BX W LOC DEV 1ST LESION IMG BX SPEC US  GUIDE 06/15/2024 [facility]   BREAST BIOPSY Left 06/15/2024   MM LT BREAST BX W LOC DEV 1ST LESION IMAGE BX SPEC STEREO GUIDE 06/15/2024 [facility]   COSMETIC SURGERY     laceration repair   KNEE ARTHROSCOPY Left 02/09/2014   Procedure: LEFT KNEE  EXCISIONAL BIOPSY;  Surgeon: Lamar Collet, MD;  Location: [facility] [place];  Service: Orthopedics;  Laterality: Left;   KNEE ARTHROSCOPY W/ LATERAL RELEASE Left 09-16-2011   PATELLA MALTRACKING   KNEE ARTHROSCOPY WITH MEDIAL MENISECTOMY Right 12/24/2023   Procedure: KNEE ARTHROSCOPY WITH MEDIAL MENISECTOMY;  Surgeon: Cristy Bonner DASEN, MD;  Location:  SUR[facility]ERY [facility];  Service: Orthopedics;  Laterality: Right;   MYRINGOTOMY     3mos 6 mos   TONSILLECTOMY  1993    SOCIAL HISTORY: Social History   Socioeconomic History   Marital status: Married    Spouse name: Toribio   Number of children: Not on file   Years of education: Not on file   Highest education level: Professional school degree (e.g., MD, DDS, DVM, JD)  Occupational History   Not on file  Tobacco Use   Smoking status: Never   Smokeless tobacco: Never  Vaping Use   Vaping status: Never Used  Substance and Sexual Activity   Alcohol use: No   Drug use: No   Sexual activity: Not on file  Other Topics Concern   Not on file  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[employer]   Social Drivers of Health   Financial Resource Strain: Low Risk  (06/15/2024)   Overall Financial Resource Strain (CARDIA)    Difficulty of Paying Living Expenses: Not hard at all  Food Insecurity: No Food Insecurity (06/15/2024)  Hunger Vital Sign    Worried About Running Out of Food in the Last Year: Never true    Ran Out of Food in the Last Year: Never true  Transportation Needs: No Transportation Needs (06/15/2024)   PRAPARE - Administrator, Civil Service (Medical): No    Lack of Transportation (Non-Medical): No  Physical Activity: Insufficiently Active (06/15/2024)   Exercise Vital Sign    Days of Exercise per Week: 1 day    Minutes of Exercise per Session: 30 min  Stress: Stress Concern Present (06/15/2024)   [employer] of Occupational Health - Occupational Stress Questionnaire    Feeling of Stress: Rather  much  Social Connections: Socially Integrated (06/15/2024)   Social Connection and Isolation Panel    Frequency of Communication with Friends and Family: More than three times a week    Frequency of Social Gatherings with Friends and Family: Patient declined    Attends Religious Services: More than 4 times per year    Active Member of [employer] or Organizations: Yes    Attends Engineer, structural: More than 4 times per year    Marital Status: Married  Catering manager Violence: Not At Risk (05/23/2024)   Humiliation, Afraid, Rape, and Kick questionnaire    Fear of Current or Ex-Partner: No    Emotionally Abused: No    Physically Abused: No    Sexually Abused: No    FAMILY HISTORY: Family History  Problem Relation Age of Onset   Anemia Mother    Hypertension Father    Melanoma Father 96       ear   Hypertension Brother    Melanoma Maternal Aunt    Breast cancer Maternal Grandmother 70   Melanoma Maternal Grandfather        dx 50s/60s, metastatic    ALLERGIES:  is allergic to percocet [oxycodone -acetaminophen ].  MEDICATIONS:  Current Outpatient Medications  Medication Sig Dispense Refill   ALPRAZolam  (XANAX ) 0.25 MG tablet Take 1 tablet (0.25 mg total) by mouth 2 (two) times daily as needed for anxiety. 20 tablet 0   metoprolol  succinate (TOPROL -XL) 25 MG 24 hr tablet Take 1 tablet (25 mg total) by mouth daily. (Patient not taking: Reported on 06/16/2024) 90 tablet 0   No current facility-administered medications for this visit.    All other systems were reviewed with the patient and are negative.  PHYSICAL EXAMINATION: ECOG PERFORMANCE STATUS: 0 - Asymptomatic  There were no vitals filed for this visit.  There were no vitals filed for this visit.  V/S and PE deferred, telephone visit.  LABORATORY DATA:  I have reviewed the data as listed Lab Results  Component Value Date   WBC 6.7 06/08/2024   HGB 14.1 06/08/2024   HCT 42.5 06/08/2024   MCV 90.8  06/08/2024   PLT 237 06/08/2024   Lab Results  Component Value Date   NA 138 06/08/2024   K 4.0 06/08/2024   CL 107 06/08/2024   CO2 20 (L) 06/08/2024    RADIOGRAPHIC STUDIES: I have personally reviewed the radiological reports and agreed with the findings in the report.  ASSESSMENT AND PLAN:  Assessment & Plan  At high risk of breast cancer Dense breast tissue obscures mammogram results. Family history includes maternal grandmother with breast cancer. Tyrer-Cuzick model shows 17.8% lifetime risk, below MRI insurance threshold, however since its almost 20 % and she has extremely dense breasts, we recommended MRI screening for better visualization. Risk-reducing mastectomy not indicated due to negative  genetic testing.  Assessment and Plan Assessment & Plan  She is here for telephone follow-up after mammogram and ultrasound as well as a biopsy today which showed tubular adenoma and usual ductal hyperplasia.  No evidence of malignancy.  She is very concerned about having to go to these biopsies every time she has intensity screen.  She is also worried that things Because of her dense breasts despite doing intensive screening.  She is considering prophylactic bilateral mastectomy and would like to have another conversation with Dr. Curvin.  I have sent him in basket message about this. She will continue intensive screening with MRI alternating with mammogram in the interim.  I encouraged her to contact us  with any issues in the meantime  All questions were answered. The patient knows to call the clinic with any problems, questions or concerns.    Amber Stalls, MD 06/21/24

## 2024-06-22 ENCOUNTER — Telehealth: Payer: Self-pay | Admitting: Hematology and Oncology

## 2024-06-22 NOTE — Telephone Encounter (Signed)
 Spoke with patient confirming upcoming appointment

## 2024-06-28 DIAGNOSIS — R9230 Dense breasts, unspecified: Secondary | ICD-10-CM | POA: Insufficient documentation

## 2024-06-28 DIAGNOSIS — Z01419 Encounter for gynecological examination (general) (routine) without abnormal findings: Secondary | ICD-10-CM | POA: Diagnosis not present

## 2024-06-28 DIAGNOSIS — Z1389 Encounter for screening for other disorder: Secondary | ICD-10-CM | POA: Diagnosis not present

## 2024-06-28 DIAGNOSIS — Z124 Encounter for screening for malignant neoplasm of cervix: Secondary | ICD-10-CM | POA: Diagnosis not present

## 2024-06-28 DIAGNOSIS — Z130 Encounter for screening for diseases of the blood and blood-forming organs and certain disorders involving the immune mechanism: Secondary | ICD-10-CM | POA: Diagnosis not present

## 2024-06-28 DIAGNOSIS — Z809 Family history of malignant neoplasm, unspecified: Secondary | ICD-10-CM | POA: Diagnosis not present

## 2024-06-28 DIAGNOSIS — Z1151 Encounter for screening for human papillomavirus (HPV): Secondary | ICD-10-CM | POA: Diagnosis not present

## 2024-07-22 DIAGNOSIS — S83241D Other tear of medial meniscus, current injury, right knee, subsequent encounter: Secondary | ICD-10-CM | POA: Diagnosis not present

## 2024-08-18 ENCOUNTER — Ambulatory Visit: Admitting: Cardiology

## 2024-08-22 DIAGNOSIS — D242 Benign neoplasm of left breast: Secondary | ICD-10-CM | POA: Diagnosis not present

## 2024-09-20 ENCOUNTER — Other Ambulatory Visit (HOSPITAL_BASED_OUTPATIENT_CLINIC_OR_DEPARTMENT_OTHER): Payer: Self-pay

## 2024-09-20 ENCOUNTER — Ambulatory Visit: Attending: Cardiology

## 2024-09-20 ENCOUNTER — Ambulatory Visit: Attending: Cardiology | Admitting: Cardiology

## 2024-09-20 ENCOUNTER — Encounter: Payer: Self-pay | Admitting: Cardiology

## 2024-09-20 ENCOUNTER — Other Ambulatory Visit: Payer: Self-pay

## 2024-09-20 VITALS — BP 90/68 | Ht 68.0 in | Wt 152.0 lb

## 2024-09-20 DIAGNOSIS — R002 Palpitations: Secondary | ICD-10-CM | POA: Insufficient documentation

## 2024-09-20 DIAGNOSIS — B009 Herpesviral infection, unspecified: Secondary | ICD-10-CM | POA: Insufficient documentation

## 2024-09-20 DIAGNOSIS — R0609 Other forms of dyspnea: Secondary | ICD-10-CM | POA: Diagnosis not present

## 2024-09-20 DIAGNOSIS — R21 Rash and other nonspecific skin eruption: Secondary | ICD-10-CM | POA: Insufficient documentation

## 2024-09-20 MED ORDER — METOPROLOL TARTRATE 25 MG PO TABS
25.0000 mg | ORAL_TABLET | Freq: Two times a day (BID) | ORAL | 3 refills | Status: DC
  Filled 2024-09-20: qty 180, 90d supply, fill #0

## 2024-09-20 MED ORDER — METOPROLOL TARTRATE 25 MG PO TABS
12.5000 mg | ORAL_TABLET | Freq: Two times a day (BID) | ORAL | 3 refills | Status: DC
  Filled 2024-09-20 – 2024-10-03 (×2): qty 90, 90d supply, fill #0

## 2024-09-20 NOTE — Addendum Note (Signed)
 Addended by: ARLOA PLANAS D on: 09/20/2024 10:39 AM   Modules accepted: Orders

## 2024-09-20 NOTE — Addendum Note (Signed)
 Addended by: ARLOA PLANAS D on: 09/20/2024 10:52 AM   Modules accepted: Orders

## 2024-09-20 NOTE — Patient Instructions (Signed)
 Medication Instructions:   START: Metoprolol  Tartrate 12.5mg  twice daily   Lab Work: None Ordered If you have labs (blood work) drawn today and your tests are completely normal, you will receive your results only by: MyChart Message (if you have MyChart) OR A paper copy in the mail If you have any lab test that is abnormal or we need to change your treatment, we will call you to review the results.   Testing/Procedures:  Your physician has requested that you have an echocardiogram. Echocardiography is a painless test that uses sound waves to create images of your heart. It provides your doctor with information about the size and shape of your heart and how well your heart's chambers and valves are working. This procedure takes approximately one hour. There are no restrictions for this procedure. Please do NOT wear cologne, perfume, aftershave, or lotions (deodorant is allowed). Please arrive 15 minutes prior to your appointment time.  Please note: We ask at that you not bring children with you during ultrasound (echo/ vascular) testing. Due to room size and safety concerns, children are not allowed in the ultrasound rooms during exams. Our front office staff cannot provide observation of children in our lobby area while testing is being conducted. An adult accompanying a patient to their appointment will only be allowed in the ultrasound room at the discretion of the ultrasound technician under special circumstances. We apologize for any inconvenience.   WHY IS MY DOCTOR PRESCRIBING ZIO? The Zio system is proven and trusted by physicians to detect and diagnose irregular heart rhythms -- and has been prescribed to hundreds of thousands of patients.  The FDA has cleared the Zio system to monitor for many different kinds of irregular heart rhythms. In a study, physicians were able to reach a diagnosis 90% of the time with the Zio system1.  You can wear the Zio monitor -- a small, discreet,  comfortable patch -- during your normal day-to-day activity, including while you sleep, shower, and exercise, while it records every single heartbeat for analysis.  1Barrett, P., et al. Comparison of 24 Hour Holter Monitoring Versus 14 Day Novel Adhesive Patch Electrocardiographic Monitoring. American Journal of Medicine, 2014.  ZIO VS. HOLTER MONITORING The Zio monitor can be comfortably worn for up to 14 days. Holter monitors can be worn for 24 to 48 hours, limiting the time to record any irregular heart rhythms you may have. Zio is able to capture data for the 51% of patients who have their first symptom-triggered arrhythmia after 48 hours.1  LIVE WITHOUT RESTRICTIONS The Zio ambulatory cardiac monitor is a small, unobtrusive, and water-resistant patch--you might even forget you're wearing it. The Zio monitor records and stores every beat of your heart, whether you're sleeping, working out, or showering.     Follow-Up: At [facility], you and your health needs are our priority.  As part of our continuing mission to provide you with exceptional heart care, we have created designated Provider Care Teams.  These Care Teams include your primary Cardiologist (physician) and Advanced Practice Providers (APPs -  Physician Assistants and Nurse Practitioners) who all work together to provide you with the care you need, when you need it.  We recommend signing up for the patient portal called MyChart.  Sign up information is provided on this After Visit Summary.  MyChart is used to connect with patients for Virtual Visits (Telemedicine).  Patients are able to view lab/test results, encounter notes, upcoming appointments, etc.  Non-urgent messages can be sent  to your provider as well.   To learn more about what you can do with MyChart, go to [email or web address].    Your next appointment:   3 month(s)  The format for your next appointment:   In Person  Provider:   Lamar Fitch, MD     Other Instructions NA

## 2024-09-20 NOTE — Progress Notes (Signed)
 Cardiology Consultation:    Date:  09/20/2024   ID:  Betty Long, DOB [birth year], MRN [id number]  PCP:  Betty Leita Repine, FNP (Inactive)  Cardiologist:  Lamar Fitch, MD   Referring MD: Betty Long,*   Chief Complaint  Patient presents with   Palpitations    History of Present Illness:    Betty Long is a 38 y.o. female who is being seen today for the evaluation of palpitations at the request of Betty Long,*.  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  She feel her heart stopping skipping and beating somewhat irregular.  No sustained arrhythmia no dizziness no shortness of breath no sweating no chest pain associated with this sensation.  Couple months ago it really became more frequent to start bothering her a lot but now things get a little bit more quiet.  She does have family of breast cancer and she got some scare at the beginning of this year she required multiple biopsy of the breast likely everything came back negative.  She does not exercise on the regular basis but had 3 children that she plays with and she had no difficulty doing it.  She works night shift she works 7 days straight and that she gets 7 days off.  Does not have family history of premature coronary artery disease  Past Medical History:  Diagnosis Date   Arrhythmia 05/2024   PVC   Family history of breast cancer    Family history of melanoma    History of abnormal cervical Pap smear    ASCUS  ---  NEGATIVE HPV   Mass of left knee    Vaginal Pap smear, abnormal     Past Surgical History:  Procedure Laterality Date   ANTERIOR CRUCIATE LIGAMENT REPAIR Left 2010   BREAST BIOPSY Left 03/23/2024   US  LT BREAST BX W LOC DEV 1ST LESION IMG BX SPEC US  GUIDE 03/23/2024 [facility]   BREAST BIOPSY Left 06/15/2024   US  LT BREAST BX W LOC DEV 1ST LESION IMG BX SPEC US  GUIDE 06/15/2024 [facility]   BREAST BIOPSY Left 06/15/2024   MM LT BREAST BX W LOC DEV 1ST LESION IMAGE BX SPEC STEREO GUIDE 06/15/2024 [facility]   COSMETIC SURGERY     laceration repair   KNEE ARTHROSCOPY Left 02/09/2014   Procedure: LEFT KNEE EXCISIONAL BIOPSY;  Surgeon: Lamar Collet, MD;  Location: [facility];  Service: Orthopedics;  Laterality: Left;   KNEE ARTHROSCOPY W/ LATERAL RELEASE Left 09-16-2011   PATELLA MALTRACKING   KNEE ARTHROSCOPY WITH MEDIAL MENISECTOMY Right 12/24/2023   Procedure: KNEE ARTHROSCOPY WITH MEDIAL MENISECTOMY;  Surgeon: Cristy Bonner DASEN, MD;  Location: [facility];  Service: Orthopedics;  Laterality: Right;   MYRINGOTOMY     3mos 6 mos   TONSILLECTOMY  1993    Current Medications: No outpatient medications have been marked as taking for the 09/20/24 encounter (Office Visit) with Kaho Selle J, MD.     Allergies:   Patient has no known allergies.   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 :)  Other Topics Concern   Not on file  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[employer]   Social Drivers of Health   Financial Resource Strain: Low Risk  (06/15/2024)   Overall Financial Resource Strain (CARDIA)    Difficulty of Paying Living Expenses: Not hard at all  Food Insecurity: No Food Insecurity (06/15/2024)   Hunger Vital Sign    Worried About Running Out of Food in the Last Year: Never true    Ran Out of  Food in the Last Year: Never true  Transportation Needs: No Transportation Needs (06/15/2024)   PRAPARE - Administrator, Civil Service (Medical): No    Lack of Transportation (Non-Medical): No  Physical Activity: Insufficiently Active (06/15/2024)   Exercise Vital Sign    Days of Exercise per Week: 1 day    Minutes of Exercise per Session: 30 min  Stress: Stress Concern Present (08/09/2024)   Received from [facility]   [employer] of Occupational Health - Occupational Stress Questionnaire    Feeling of Stress : To some extent  Social Connections: Socially Integrated (06/15/2024)   Social Connection and Isolation Panel    Frequency of Communication with Friends and Family: More than three times a week    Frequency of Social Gatherings with Friends and Family: Patient declined    Attends Religious Services: More than 4 times per year    Active Member of [employer] or Organizations: Yes    Attends Engineer, structural: More than 4 times per year    Marital Status: Married     Family History: The patient's family history includes Anemia in her mother; Breast cancer (age of onset: 49) in her maternal grandmother; Cancer in her maternal aunt and maternal grandfather; Hearing loss in her maternal grandfather; Hyperlipidemia in her father and maternal grandmother; Hypertension in her brother and father; Melanoma in her maternal aunt and maternal grandfather; Melanoma (age of onset: 34) in her father. ROS:   Please see the history of present illness.    All 14 point review of systems negative except as described per history of present illness.  EKGs/Labs/Other Studies Reviewed:    The following studies were reviewed today:   EKG:  EKG Interpretation Date/Time:  Tuesday September 20 2024 10:09:06 EDT Ventricular Rate:  88 PR Interval:  134 QRS Duration:  76 QT Interval:  364 QTC Calculation: 440 R Axis:   84  Text Interpretation: Normal sinus rhythm  Normal ECG When compared with ECG of 08-Jun-2024 08:21, PREVIOUS ECG IS PRESENT Confirmed by Bernie Charleston [phone]) on 09/20/2024 10:13:14 AM    Recent Labs: 06/08/2024: BUN 11; Creatinine, Ser 0.76; Hemoglobin 14.1; Magnesium 2.1; Platelets 237; Potassium 4.0; Sodium 138; TSH 1.508  Recent Lipid Panel No results found for: CHOL, TRIG, HDL, CHOLHDL, VLDL, LDLCALC, LDLDIRECT  Physical Exam:    VS:  BP 90/68   Ht 5' 8 (1.727 m)   Wt 152 lb (68.9 kg)   SpO2 99%   BMI 23.11 kg/m     Wt Readings from Last 3 Encounters:  09/20/24 152 lb (68.9 kg)  06/16/24 144 lb 3.2 oz (65.4 kg)  05/23/24 146 lb 3.2 oz (66.3  kg)     GEN:  Well nourished, well developed in no acute distress HEENT: Normal NECK: No JVD; No carotid bruits LYMPHATICS: No lymphadenopathy CARDIAC: RRR, no murmurs, no rubs, no gallops RESPIRATORY:  Clear to auscultation without rales, wheezing or rhonchi  ABDOMEN: Soft, non-tender, non-distended MUSCULOSKELETAL:  No edema; No deformity  SKIN: Warm and dry NEUROLOGIC:  Alert and oriented x 3 PSYCHIATRIC:  Normal affect   ASSESSMENT:    1. Palpitations   2. Dyspnea on exertion    PLAN:    In order of problems listed above:  Palpitations.  She was in the emergency room she was diagnosed with PVCs.  I have asked her to have echocardiogram to assess left ventricular ejection fraction, she also will have Zio patch to see the burden of PVCs.  She does drink a lot of coffee 3 cups a day usually.  Ask her to cut down coffee.  She was given metoprolol  succinate 25 daily I asked her to switch to Metropol tartrate only 12.5 twice a day.  She did not have a chance to take her medication yet but I asked her to try 1 day when everything is fine to make sure she tolerated that medication well especially in view of the fact that her blood pressure is low.  And then she need to take it on the days that she get a lot of PVCs.  In the meantime we will do investigation in  terms of significance of this arrhythmia. Dyspnea on exertion again echocardiogram will be done   Medication Adjustments/Labs and Tests Ordered: Current medicines are reviewed at length with the patient today.  Concerns regarding medicines are outlined above.  Orders Placed This Encounter  Procedures   EKG 12-Lead   No orders of the defined types were placed in this encounter.   Signed, Lamar DOROTHA Fitch, MD, [facility]. 09/20/2024 10:33 AM    [facility]

## 2024-09-29 ENCOUNTER — Other Ambulatory Visit: Payer: Self-pay | Admitting: General Surgery

## 2024-09-29 DIAGNOSIS — N6310 Unspecified lump in the right breast, unspecified quadrant: Secondary | ICD-10-CM

## 2024-10-01 ENCOUNTER — Other Ambulatory Visit (HOSPITAL_BASED_OUTPATIENT_CLINIC_OR_DEPARTMENT_OTHER): Payer: Self-pay

## 2024-10-03 ENCOUNTER — Other Ambulatory Visit (HOSPITAL_BASED_OUTPATIENT_CLINIC_OR_DEPARTMENT_OTHER): Payer: Self-pay

## 2024-10-04 ENCOUNTER — Ambulatory Visit (HOSPITAL_BASED_OUTPATIENT_CLINIC_OR_DEPARTMENT_OTHER)
Admission: RE | Admit: 2024-10-04 | Discharge: 2024-10-04 | Disposition: A | Discharge status: Home / Self Care | Source: Ambulatory Visit | Attending: Cardiology | Admitting: Cardiology

## 2024-10-04 DIAGNOSIS — R0609 Other forms of dyspnea: Secondary | ICD-10-CM | POA: Insufficient documentation

## 2024-10-04 LAB — ECHOCARDIOGRAM COMPLETE
AR max vel: 2.94 cm2
AV Area VTI: 3.17 cm2
AV Area mean vel: 3.04 cm2
AV Mean grad: 3 mmHg
AV Peak grad: 6.2 mmHg
Ao pk vel: 1.24 m/s
Area-P 1/2: 3.27 cm2
Calc EF: 60 %
MV M vel: 3.19 m/s
MV Peak grad: 40.7 mmHg
S' Lateral: 2.8 cm
Single Plane A2C EF: 61.2 %
Single Plane A4C EF: 61.2 %

## 2024-10-06 ENCOUNTER — Ambulatory Visit (HOSPITAL_BASED_OUTPATIENT_CLINIC_OR_DEPARTMENT_OTHER): Attending: Physician Assistant | Admitting: Physical Therapy

## 2024-10-06 DIAGNOSIS — R262 Difficulty in walking, not elsewhere classified: Secondary | ICD-10-CM | POA: Diagnosis not present

## 2024-10-06 DIAGNOSIS — M6281 Muscle weakness (generalized): Secondary | ICD-10-CM | POA: Diagnosis not present

## 2024-10-06 DIAGNOSIS — M25561 Pain in right knee: Secondary | ICD-10-CM | POA: Diagnosis not present

## 2024-10-06 NOTE — Therapy (Signed)
 OUTPATIENT PHYSICAL THERAPY TREATMENT Progress Note Reporting Period 01/01/2024 to 03/26/2024  See note below for Objective Data and Assessment of Progress/Goals.      Patient Name: Betty Long MRN: [id number] DOB:[birth year], 38 y.o., female Today's Date: 10/07/2024  END OF SESSION:  PT End of Session - 10/07/24 0820     Visit Number 26    Number of Visits 41    Date for Recertification  06/03/24    Authorization Type MC    PT Start Time 1230    PT Stop Time 1312    PT Time Calculation (min) 42 min    Activity Tolerance Patient tolerated treatment well;No increased pain    Behavior During Therapy WFL for tasks assessed/performed                   Past Medical History:  Diagnosis Date   Arrhythmia 05/2024   PVC   Family history of breast cancer    Family history of melanoma    History of abnormal cervical Pap smear    ASCUS  ---  NEGATIVE HPV   Mass of left knee    Vaginal Pap smear, abnormal    Past Surgical History:  Procedure Laterality Date   ANTERIOR CRUCIATE LIGAMENT REPAIR Left 2010   BREAST BIOPSY Left 03/23/2024   US  LT BREAST BX W LOC DEV 1ST LESION IMG BX SPEC US  GUIDE 03/23/2024 [facility]   BREAST BIOPSY Left 06/15/2024   US  LT BREAST BX W LOC DEV 1ST LESION IMG BX SPEC US  GUIDE 06/15/2024 [facility]   BREAST BIOPSY Left 06/15/2024   MM LT BREAST BX W LOC DEV 1ST LESION IMAGE BX SPEC STEREO GUIDE 06/15/2024 [facility]   COSMETIC SURGERY     laceration repair   KNEE ARTHROSCOPY Left 02/09/2014   Procedure: LEFT KNEE EXCISIONAL BIOPSY;  Surgeon: Lamar Collet, MD;  Location: [facility];  Service: Orthopedics;  Laterality: Left;   KNEE ARTHROSCOPY W/ LATERAL RELEASE Left 09-16-2011   PATELLA MALTRACKING   KNEE ARTHROSCOPY WITH MEDIAL MENISECTOMY Right 12/24/2023   Procedure: KNEE ARTHROSCOPY WITH MEDIAL MENISECTOMY;  Surgeon: Cristy Bonner DASEN, MD;  Location: [facility];  Service:  Orthopedics;  Laterality: Right;   MYRINGOTOMY     3mos 6 mos   TONSILLECTOMY  1993   Patient Active Problem List   Diagnosis Date Noted   Macular eruption 09/20/2024   Herpes simplex without complication 09/20/2024   Palpitations 09/20/2024   Dyspnea on exertion 09/20/2024   Dense breast tissue 06/28/2024   At increased risk of breast cancer 05/23/2024   Tubular adenoma of left female breast 05/05/2024   Genetic testing 07/07/2021   Family history of breast cancer 06/21/2021   Family history of melanoma 06/21/2021   Indication for care in labor or delivery 10/22/2017   Indication for care in labor and delivery, antepartum 01/26/2015   NSVD (normal spontaneous vaginal delivery) 01/26/2015   S/P knee surgery 02/09/2014   Contraception management 05/12/2012   ASCUS on Pap smear 05/12/2012    REFERRING PROVIDER:  Cristy Bonner DASEN, MD     REFERRING DIAG: S/P right medial and lateral meniscus repair,ACL reconstruction   Rationale for Evaluation and Treatment: Rehabilitation  THERAPY DIAG:  Acute pain of right knee  Muscle weakness (generalized)  Difficulty in walking, not elsewhere classified  ONSET DATE: DOS 12/24/23   SUBJECTIVE:  SUBJECTIVE STATEMENT: The patient returns to physical therapy after several months.  She has been working on her own.  She feels like she has had a increase in lateral inferior knee pain.  She also feels like she is lacking full extension.  She has returned to straight line running.  She has been able to do some things with her kids but has not returned to full jumping.  She would like to work on a program to return to full jumping and full sporting activities.  PERTINENT HISTORY:  Lt ACLR and lateral release  PAIN:  Are you having pain? Yes: NPRS scale:  4/10 Pain location: Rt knee Pain description: uncomfortable, tight Aggravating factors: constant Relieving factors: ice  PRECAUTIONS:  Other: see WB precautions  RED FLAGS: None   WEIGHT BEARING RESTRICTIONS:  Yes through 2/20: WBAT 0-90, ROM 0-90, no tibial rotation   FALLS:  Has patient fallen in last 6 months? 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  PATIENT SURVEYS:  LEFS 15  5/2: LEFS 50 COGNITIVE STATUS: Within functional limits for tasks assessed   SENSATION: WFL  EDEMA:  Yes: moderate  GAIT: EVAL: with bil axillary crutches   Body Part #1 Knee  PALPATION: EVAL: Good patellar mobility, no s/s of infection  LOWER EXTREMITY ROM:     Passive  Right eval Right  2/4  3/3 3/6 3/10 3/27 4/7 R 5/2 L 5/2 R  Hip flexion             Hip extension             Hip abduction             Hip adduction             Hip internal rotation             Hip external rotation             Knee flexion 40 58 74 100 106 103  107 110.5 124 143 130  Knee extension -3 -2 -4  3 3   0  -3 0 Started with 3 degrees away from full extension.  With manual therapy was able to get the patient to full extension.   (Blank rows = not tested)   LOWER EXTREMITY MMT:    MMT Right 5/2 Left 5/2  Hip flexion    Hip extension    Hip abduction    Hip adduction    Hip internal rotation    Hip external rotation    Knee flexion 33.2 32.0  Knee extension 37.3 39.1  Ankle dorsiflexion    Ankle plantarflexion    Ankle inversion    Ankle eversion     (Blank rows = not tested)                                                    Tinq Last eval  L 90: 63.2 L 45   63.0  R: 90 64.9 R 45: 68.6  11/6 L 90: 59.3 L 45   56.7  R: 90 69.9 R  45: 58.5  TREATMENT DATE:  11/7 Therapeutic exercise: Tindq testing   Leg press Cybex:  90 pounds x 12 RPE of 3 110 pounds x 12 RPE of 4 130 pounds RPE of 5  [employer] fitness knee extension machine 15 pounds can feel minor pain in the area that she has pain. 3 x 12 no increase in pain as she perform the activity.  Manual: Extension stretch with distraction  Hamstring release  Grade II and III Posterior glides with rotation     6/9 There-act Side shuffle 20'x4 Side shuffle with direction change 20'x4 Change of speed with running 4 laps  Neuro-re-ed  Hop over hurdle 2x10  Side to side hop x20  Fwd back hop on air-ex x20   There-ex:  Cybex leg press 90, 110 ,130 x12 each   LF leg extension 25 lbs 3x12     6/3  There-ex:  Elliptical L3 5 min   There-act Side shuffle 20'x4 Side shuffle with direction change 20'x4 Low skip 20'x4 [place] step 20'x4  Neuro-re-ed  Cable walk fwd and back 15 lbs 10x each   TRX squats 3x10   Goblet squats 2x15 15 lbs in the mirror.    5/28 Manual:  PROM ext mobs grade 3 PROM flex gapping grade 3 Trigger point release to the anterior hip    There-act Side shuffle 20'x4 Side shuffle with direction change 20'x4 Low skip 20'x4 [place] step 20'x4  Neuro-re-ed  Quick step  Side to side hop  Fwd back hop   Squat jump  Bounding   There-ex:   SLR 3x10 2.5 lbs Ex bike 5 min L8    5/22  There-ex: strength testing with tindq . Reviewed goals   Upright bike 5 min   Cybex leg press 90, 110 ,130 x12 each   LF leg extension 25 lbs 3x12   Gait: reviewed return to running ideas   5/16 Manual:  PROM ext mobs grade 3 PROM flex gapping grade 3 Trigger point release to the anterior hip   There-ex Dead left 26 lbs KB from 4 inch  Upright bike 5 min    Neuro re-ed:  TRX squat 3x10  Goblet squat watching posture in mirror 3x12 15  lbs  Eccentric strep down 4 inch 3x10  Step onto and off air-ex 2x10 fwd and lateral       5/9 Manual: All PROM performed with distraction to reduce pain and improve movement PROM ext mobs grade 3 PROM flex gapping grade 3 There-ex: Nustep lvl 5 SLR 2x10 4lbs Leg press 130lbs (next time do 150)    PATIENT EDUCATION:  Education details: Anatomy of condition, POC, HEP, exercise form/rationale Person educated: Patient Education method: Explanation, Demonstration, Tactile cues, Verbal cues,  Education comprehension: verbalized understanding, returned demonstration, verbal cues required, tactile cues required, and needs further education  HOME EXERCISE PROGRAM: Access Code: [email or web address] URL: [email or web address]    ASSESSMENT:  CLINICAL IMPRESSION: Therapy perform reassessment on the patient today.  She is lacking terminal knee extension.  She had improved knee extension with manual therapy.  She had reproduction of anterior pain with full knee extension.  Will continue to work on knee extension.  We tested her for her leg strength.  She is about the same as she was when she left.  Her left and right percentages are good but her numbers have not improved since her last physical therapy visit.  She been very busy with the kids and other medical procedures.  We discussed  return to a more consistent exercise program.  Will also work on a return to jumping program.  Will work on technique with jumps.  She would benefit from further skilled therapy 1W8 to return to full sporting activity.   REHAB POTENTIAL: Good  CLINICAL DECISION MAKING: Stable/uncomplicated  EVALUATION COMPLEXITY: Low   GOALS: Goals reviewed with patient? 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  PLAN FOR NEXT SESSION:  Begin  sportmetric jumping program. Review running/cutting training  Alm JINNY Don, PT 10/07/2024 8:40 AM  10/07/24 8:40 AM [facility] [address] [place], [place], [address] Phone: [phone]   Fax:  [phone]  `

## 2024-10-07 ENCOUNTER — Encounter (HOSPITAL_BASED_OUTPATIENT_CLINIC_OR_DEPARTMENT_OTHER): Payer: Self-pay | Admitting: Physical Therapy

## 2024-10-10 ENCOUNTER — Ambulatory Visit: Payer: Self-pay | Admitting: Cardiology

## 2024-10-13 ENCOUNTER — Ambulatory Visit
Admission: RE | Admit: 2024-10-13 | Discharge: 2024-10-13 | Disposition: A | Discharge status: Home / Self Care | Source: Ambulatory Visit | Attending: General Surgery | Admitting: General Surgery

## 2024-10-13 DIAGNOSIS — N6001 Solitary cyst of right breast: Secondary | ICD-10-CM | POA: Diagnosis not present

## 2024-10-13 DIAGNOSIS — N6310 Unspecified lump in the right breast, unspecified quadrant: Secondary | ICD-10-CM

## 2024-10-13 DIAGNOSIS — R928 Other abnormal and inconclusive findings on diagnostic imaging of breast: Secondary | ICD-10-CM | POA: Diagnosis not present

## 2024-10-14 DIAGNOSIS — H52223 Regular astigmatism, bilateral: Secondary | ICD-10-CM | POA: Diagnosis not present

## 2024-10-17 DIAGNOSIS — D4861 Neoplasm of uncertain behavior of right breast: Secondary | ICD-10-CM | POA: Diagnosis not present

## 2024-10-17 DIAGNOSIS — R002 Palpitations: Secondary | ICD-10-CM

## 2024-11-01 ENCOUNTER — Ambulatory Visit (HOSPITAL_BASED_OUTPATIENT_CLINIC_OR_DEPARTMENT_OTHER): Admitting: Physical Therapy

## 2024-11-01 ENCOUNTER — Encounter (HOSPITAL_BASED_OUTPATIENT_CLINIC_OR_DEPARTMENT_OTHER): Payer: Self-pay | Admitting: Physical Therapy

## 2024-11-01 DIAGNOSIS — M6281 Muscle weakness (generalized): Secondary | ICD-10-CM | POA: Diagnosis present

## 2024-11-01 DIAGNOSIS — M25561 Pain in right knee: Secondary | ICD-10-CM | POA: Diagnosis present

## 2024-11-01 DIAGNOSIS — R262 Difficulty in walking, not elsewhere classified: Secondary | ICD-10-CM | POA: Insufficient documentation

## 2024-11-01 NOTE — Therapy (Signed)
 OUTPATIENT PHYSICAL THERAPY TREATMENT Progress Note Reporting Period 01/01/2024 to 03/26/2024  See note below for Objective Data and Assessment of Progress/Goals.      Patient Name: Betty Long MRN: [id number] DOB:1986-10-03, 38 y.o., female Today's Date: 11/01/2024  END OF SESSION:  PT End of Session - 11/01/24 1305     Visit Number 27    Number of Visits 41    Date for Recertification  12/02/24    PT Start Time 1100    PT Stop Time 1143    PT Time Calculation (min) 43 min    Activity Tolerance Patient tolerated treatment well;No increased pain    Behavior During Therapy WFL for tasks assessed/performed                   Past Medical History:  Diagnosis Date   Arrhythmia 05/2024   PVC   Family history of breast cancer    Family history of melanoma    History of abnormal cervical Pap smear    ASCUS  ---  NEGATIVE HPV   Mass of left knee    Vaginal Pap smear, abnormal    Past Surgical History:  Procedure Laterality Date   ANTERIOR CRUCIATE LIGAMENT REPAIR Left 2010   BREAST BIOPSY Left 03/23/2024   US  LT BREAST BX W LOC DEV 1ST LESION IMG BX SPEC US  GUIDE 03/23/2024 [facility]   BREAST BIOPSY Left 06/15/2024   US  LT BREAST BX W LOC DEV 1ST LESION IMG BX SPEC US  GUIDE 06/15/2024 [facility]   BREAST BIOPSY Left 06/15/2024   MM LT BREAST BX W LOC DEV 1ST LESION IMAGE BX SPEC STEREO GUIDE 06/15/2024 [facility]   COSMETIC SURGERY     laceration repair   KNEE ARTHROSCOPY Left 02/09/2014   Procedure: LEFT KNEE EXCISIONAL BIOPSY;  Surgeon: Lamar Collet, MD;  Location: [facility];  Service: Orthopedics;  Laterality: Left;   KNEE ARTHROSCOPY W/ LATERAL RELEASE Left 09-16-2011   PATELLA MALTRACKING   KNEE ARTHROSCOPY WITH MEDIAL MENISECTOMY Right 12/24/2023   Procedure: KNEE ARTHROSCOPY WITH MEDIAL MENISECTOMY;  Surgeon: Cristy Bonner DASEN, MD;  Location: [facility];  Service: Orthopedics;  Laterality: Right;    MYRINGOTOMY     3mos 6 mos   TONSILLECTOMY  1993   Patient Active Problem List   Diagnosis Date Noted   Macular eruption 09/20/2024   Herpes simplex without complication 09/20/2024   Palpitations 09/20/2024   Dyspnea on exertion 09/20/2024   Dense breast tissue 06/28/2024   At increased risk of breast cancer 05/23/2024   Tubular adenoma of left female breast 05/05/2024   Genetic testing 07/07/2021   Family history of breast cancer 06/21/2021   Family history of melanoma 06/21/2021   Indication for care in labor or delivery 10/22/2017   Indication for care in labor and delivery, antepartum 01/26/2015   NSVD (normal spontaneous vaginal delivery) 01/26/2015   S/P knee surgery 02/09/2014   Contraception management 05/12/2012   ASCUS on Pap smear 05/12/2012    REFERRING PROVIDER:  Cristy Bonner DASEN, MD     REFERRING DIAG: S/P right medial and lateral meniscus repair,ACL reconstruction   Rationale for Evaluation and Treatment: Rehabilitation  THERAPY DIAG:  Acute pain of right knee  Muscle weakness (generalized)  Difficulty in walking, not elsewhere classified  ONSET DATE: DOS 12/24/23   SUBJECTIVE:  SUBJECTIVE STATEMENT: The patient has started playing basketball with her kids. She can notice it when she goes back into full extension.   PERTINENT HISTORY:  Lt ACLR and lateral release  PAIN:  Are you having pain? Yes: NPRS scale: 4/10 Pain location: Rt knee Pain description: uncomfortable, tight Aggravating factors: constant Relieving factors: ice  PRECAUTIONS:  Other: see WB precautions  RED FLAGS: None   WEIGHT BEARING RESTRICTIONS:  Yes through 2/20: WBAT 0-90, ROM 0-90, no tibial rotation   FALLS:  Has patient fallen in last 6 months? 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  PATIENT SURVEYS:  LEFS 15  5/2: LEFS 50 COGNITIVE STATUS: Within functional limits for tasks assessed   SENSATION: WFL  EDEMA:  Yes: moderate  GAIT: EVAL: with bil axillary crutches   Body Part #1 Knee  PALPATION: EVAL: Good patellar mobility, no s/s of infection  LOWER EXTREMITY ROM:     Passive  Right eval Right  2/4  3/3 3/6 3/10 3/27 4/7 R 5/2 L 5/2 R  Hip flexion             Hip extension             Hip abduction             Hip adduction             Hip internal rotation             Hip external rotation             Knee flexion 40 58 74 100 106 103  107 110.5 124 143 130  Knee extension -3 -2 -4  3 3   0  -3 0 Started with 3 degrees away from full extension.  With manual therapy was able to get the patient to full extension.   (Blank rows = not tested)   LOWER EXTREMITY MMT:    MMT Right 5/2 Left 5/2  Hip flexion    Hip extension    Hip abduction    Hip adduction    Hip internal rotation    Hip external rotation    Knee flexion 33.2 32.0  Knee extension 37.3 39.1  Ankle dorsiflexion    Ankle plantarflexion    Ankle inversion    Ankle eversion     (Blank rows = not tested)                                                    Tinq Last eval  L 90: 63.2 L 45   63.0  R: 90 64.9 R 45: 68.6  11/6 L 90: 59.3 L 45   56.7  R: 90 69.9 R 45: 58.5  TREATMENT DATE:  12/2 There-ex:  Elliptical 5 min   Manual: Manual: Extension stretch with distraction  Hamstring release  Grade II and III Posterior glides with rotation   Neuro-re-ed:  Drop jump 4 inch 3 RPE x10  8 inch 5 RPE 2x10  [facility] drill 2x10     Gait:   Cutting left and right  Box drill (shuffle -> back pedal -> shuffle -> sprint) 4x  Accelerate to decelerate 6x Side shuffle to push off  6 laps    11/7 Therapeutic exercise: Tindq testing   Leg press Cybex:  90 pounds x 12 RPE of 3 110 pounds x 12 RPE of 4 130 pounds RPE of 5  [employer] knee extension machine 15 pounds can feel minor pain in the area that she has pain. 3 x 12 no increase in pain as she perform the activity.  Manual: Extension stretch with distraction  Hamstring release  Grade II and III Posterior glides with rotation     6/9 There-act Side shuffle 20'x4 Side shuffle with direction change 20'x4 Change of speed with running 4 laps  Neuro-re-ed  Hop over hurdle 2x10  Side to side hop x20  Fwd back hop on air-ex x20   There-ex:  Cybex leg press 90, 110 ,130 x12 each   LF leg extension 25 lbs 3x12     6/3  There-ex:  Elliptical L3 5 min   There-act Side shuffle 20'x4 Side shuffle with direction change 20'x4 Low skip 20'x4 [place] step 20'x4  Neuro-re-ed  Cable walk fwd and back 15 lbs 10x each   TRX squats 3x10   Goblet squats 2x15 15 lbs in the mirror.    5/28 Manual:  PROM ext mobs grade 3 PROM flex gapping grade 3 Trigger point release to the anterior hip    There-act Side shuffle 20'x4 Side shuffle with direction change 20'x4 Low skip 20'x4 [place] step 20'x4  Neuro-re-ed  Quick step  Side to side hop  Fwd back hop   Squat jump  Bounding   There-ex:   SLR 3x10 2.5 lbs Ex bike 5 min L8    5/22  There-ex: strength testing with tindq . Reviewed goals   Upright bike 5 min   Cybex leg press 90, 110 ,130 x12 each   LF leg extension 25 lbs 3x12   Gait: reviewed return to running ideas   5/16 Manual:  PROM ext mobs grade 3 PROM flex gapping grade 3 Trigger point release to the anterior hip   There-ex Dead left 26 lbs KB from 4 inch  Upright bike 5 min    Neuro re-ed:  TRX squat 3x10   Goblet squat watching posture in mirror 3x12 15 lbs  Eccentric strep down 4 inch 3x10  Step onto and off air-ex 2x10 fwd and lateral       5/9 Manual: All PROM performed with distraction to reduce pain and improve movement PROM ext mobs grade 3 PROM flex gapping grade 3 There-ex: Nustep lvl 5 SLR 2x10 4lbs Leg press 130lbs (next time do 150)    PATIENT EDUCATION:  Education details: Anatomy of condition, POC, HEP, exercise form/rationale Person educated: Patient Education method: Explanation, Demonstration, Tactile cues, Verbal cues,  Education comprehension: verbalized understanding, returned demonstration, verbal cues required, tactile cues required, and needs further education  HOME EXERCISE PROGRAM: Access Code: [email or web address] URL: [email or web address]    ASSESSMENT:  CLINICAL IMPRESSION: Therapy performed baseline running and cutting drills with the patient today.  She reported decreased confidence but no pain. She was given for home. We continues to work on her extension. After manual therapy she was able to get extension to the table but she is still lacking knee hyper extension.  REHAB POTENTIAL: Good  CLINICAL DECISION MAKING: Stable/uncomplicated  EVALUATION COMPLEXITY: Low   GOALS: Goals reviewed with patient? 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  PLAN FOR NEXT SESSION:  Begin  sportmetric jumping program. Review running/cutting training  Alm JINNY Don, PT 11/01/2024 1:25 PM  11/01/24 1:25 PM [facility] [address] [place], [place], [address] Phone: [phone]   Fax:  [phone]  `

## 2024-11-05 ENCOUNTER — Ambulatory Visit
Admission: RE | Admit: 2024-11-05 | Discharge: 2024-11-05 | Disposition: A | Discharge status: Home / Self Care | Source: Ambulatory Visit | Attending: Family Medicine | Admitting: Family Medicine

## 2024-11-05 ENCOUNTER — Other Ambulatory Visit: Payer: Self-pay

## 2024-11-05 VITALS — BP 100/69 | HR 103 | Temp 99.2°F | Resp 20 | Ht 68.0 in | Wt 145.0 lb

## 2024-11-05 DIAGNOSIS — J111 Influenza due to unidentified influenza virus with other respiratory manifestations: Secondary | ICD-10-CM | POA: Diagnosis not present

## 2024-11-05 LAB — POC SOFIA SARS ANTIGEN FIA: SARS Coronavirus 2 Ag: NEGATIVE

## 2024-11-05 LAB — POCT INFLUENZA A/B
Influenza A, POC: NEGATIVE
Influenza B, POC: NEGATIVE

## 2024-11-05 NOTE — ED Provider Notes (Signed)
 [facility] Long [facility]    CSN: [id number] Arrival date & time: 11/05/24  1310      History   Chief Complaint Chief Complaint  Patient presents with   URI    101.4 Fever, bone aches, cough, chest burning - Entered by patient    HPI Betty Long is a 38 y.o. female.   Patient has been sick for the last 2 to 3 days.  Fever to over 101.  Headache body aches fatigue.  Joint pain.  Hurts all over.  Hoarse voice.  Nasal drip.  Some ear pressure.  Not much coughing.  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    Past Medical History:  Diagnosis Date   Arrhythmia 05/2024   PVC   Family history of breast cancer    Family history of melanoma    History of abnormal cervical Pap smear    ASCUS  ---  NEGATIVE HPV   Mass of left knee    Vaginal Pap smear, abnormal     Patient Active Problem List   Diagnosis Date Noted   Macular eruption 09/20/2024   Herpes simplex without complication 09/20/2024   Palpitations 09/20/2024   Dyspnea on exertion 09/20/2024   Dense breast tissue 06/28/2024   At increased risk of breast cancer 05/23/2024   Tubular adenoma of left female breast 05/05/2024   Genetic testing 07/07/2021   Family history of breast cancer 06/21/2021   Family history of melanoma 06/21/2021   Indication for care in labor or delivery 10/22/2017   Indication for care in labor and delivery, antepartum 01/26/2015   NSVD (normal spontaneous vaginal delivery) 01/26/2015   S/P knee surgery 02/09/2014   Contraception management 05/12/2012   ASCUS on Pap smear 05/12/2012    Past Surgical History:  Procedure Laterality Date   ANTERIOR CRUCIATE LIGAMENT REPAIR Left 2010   BREAST BIOPSY Left 03/23/2024   US  LT BREAST BX W LOC DEV 1ST LESION IMG BX SPEC US  GUIDE 03/23/2024 [facility]   BREAST BIOPSY Left 06/15/2024   US  LT BREAST BX W LOC DEV 1ST LESION IMG BX SPEC US  GUIDE 06/15/2024 [facility]   BREAST BIOPSY Left 06/15/2024   MM LT BREAST BX W LOC DEV 1ST  LESION IMAGE BX SPEC STEREO GUIDE 06/15/2024 [facility]   COSMETIC SURGERY     laceration repair   KNEE ARTHROSCOPY Left 02/09/2014   Procedure: LEFT KNEE EXCISIONAL BIOPSY;  Surgeon: Lamar Collet, MD;  Location: [facility];  Service: Orthopedics;  Laterality: Left;   KNEE ARTHROSCOPY W/ LATERAL RELEASE Left 09-16-2011   PATELLA MALTRACKING   KNEE ARTHROSCOPY WITH MEDIAL MENISECTOMY Right 12/24/2023   Procedure: KNEE ARTHROSCOPY WITH MEDIAL MENISECTOMY;  Surgeon: Cristy Bonner DASEN, MD;  Location: [facility];  Service: Orthopedics;  Laterality: Right;   MYRINGOTOMY     3mos 6 mos   TONSILLECTOMY  1993    OB History     Gravida  2   Para  2   Term  2   Preterm  0   AB  0   Living  2      SAB  0   IAB  0   Ectopic  0   Multiple  0   Live Births  2            Home Medications    Prior to Admission medications   Medication Sig Start Date End Date Taking? Authorizing Provider  metoprolol  tartrate (LOPRESSOR ) 25 MG tablet Take  ONE-HALF tablets (12.5 mg total) by mouth 2 (two) times daily. 09/20/24 01/01/25  Bernie Lamar PARAS, MD    Family History Family History  Problem Relation Age of Onset   Anemia Mother    Hypertension Father    Melanoma Father 71       ear   Hyperlipidemia Father    Hypertension Brother    Cancer Maternal Aunt    Melanoma Maternal Aunt    Breast cancer Maternal Grandmother 51   Hyperlipidemia Maternal Grandmother    Melanoma Maternal Grandfather        dx 50s/60s, metastatic   Cancer Maternal Grandfather    Hearing loss Maternal Grandfather     Social History Social History   Tobacco Use   Smoking status: Never   Smokeless tobacco: Never  Vaping Use   Vaping status: Never Used  Substance Use Topics   Alcohol use: No   Drug use: No     Allergies   Patient has no known allergies.   Review of Systems Review of Systems See HPI  Physical Exam Triage Vital Signs ED Triage Vitals   Encounter Vitals Group     BP 11/05/24 1324 100/69     Girls Systolic BP Percentile --      Girls Diastolic BP Percentile --      Boys Systolic BP Percentile --      Boys Diastolic BP Percentile --      Pulse Rate 11/05/24 1324 (!) 103     Resp 11/05/24 1324 20     Temp 11/05/24 1324 99.2 F (37.3 C)     Temp Source 11/05/24 1324 Oral     SpO2 11/05/24 1324 97 %     Weight 11/05/24 1327 145 lb (65.8 kg)     Height 11/05/24 1327 5' 8 (1.727 m)     Head Circumference --      Peak Flow --      Pain Score 11/05/24 1327 4     Pain Loc --      Pain Education --      Exclude from Growth Chart --    No data found.  Updated Vital Signs BP 100/69 (BP Location: Right Arm)   Pulse (!) 103   Temp 99.2 F (37.3 C) (Oral)   Resp 20   Ht 5' 8 (1.727 m)   Wt 65.8 kg   LMP 10/17/2024   SpO2 97%   BMI 22.05 kg/m       Physical Exam Constitutional:      General: She is not in acute distress.    Appearance: She is well-developed. She is ill-appearing.  HENT:     Head: Normocephalic and atraumatic.     Right Ear: Tympanic membrane normal.     Left Ear: Tympanic membrane normal.     Nose: Congestion present. No rhinorrhea.     Comments: Voice is hoarse    Mouth/Throat:     Mouth: Mucous membranes are moist.     Pharynx: Posterior oropharyngeal erythema present.  Eyes:     Conjunctiva/sclera: Conjunctivae normal.     Pupils: Pupils are equal, round, and reactive to light.  Cardiovascular:     Rate and Rhythm: Normal rate.     Heart sounds: Normal heart sounds.  Pulmonary:     Effort: Pulmonary effort is normal. No respiratory distress.     Breath sounds: Normal breath sounds.  Musculoskeletal:        General: Normal range of motion.  Cervical back: Normal range of motion.  Lymphadenopathy:     Cervical: No cervical adenopathy.  Skin:    General: Skin is warm and dry.  Neurological:     Mental Status: She is alert.      UC Treatments / Results  Labs (all labs  ordered are listed, but only abnormal results are displayed) Labs Reviewed  POC SOFIA SARS ANTIGEN FIA - Normal  POCT INFLUENZA A/B - Normal    EKG   Radiology No results found.  Procedures Procedures (including critical care time)  Medications Ordered in UC Medications - No data to display  Initial Impression / Assessment and Plan / UC Course  I have reviewed the triage vital signs and the nursing notes.  Pertinent labs & imaging results that were available during my care of the patient were reviewed by me and considered in my medical decision making (see chart for details).     Flu and COVID test are negative.  Viral illness discussed Final Clinical Impressions(s) / UC Diagnoses   Final diagnoses:  Influenza-like illness     Discharge Instructions      Try to get plenty of rest.  Push fluids.  May take over-the-counter medicines for the symptoms.  Call for problems   ED Prescriptions   None    PDMP not reviewed this encounter.   Maranda Jamee Jacob, MD 11/05/24 [phone]

## 2024-11-05 NOTE — ED Triage Notes (Signed)
 Pt presenting with c/o generalized body ache, voice hoarseness, non productive cough, bilateral ear pain and  elevated temperature x 2 days. Pt stated she has been taking Mucinex, last dose this AM and Ibuprofen , last dose 10 AM  for body ache which was ineffective. Tylenol  at noon today for temp 10.8.

## 2024-11-05 NOTE — Discharge Instructions (Addendum)
 Try to get plenty of rest.  Push fluids.  May take over-the-counter medicines for the symptoms.  Call for problems

## 2024-11-06 NOTE — Telephone Encounter (Signed)
 Follow up call was done. Pt states that she isn't feeling any better. Pt states that she now has a cough and  more congestion. Pt states that she will come back to be reevaluated. LM

## 2024-11-07 ENCOUNTER — Encounter (HOSPITAL_BASED_OUTPATIENT_CLINIC_OR_DEPARTMENT_OTHER): Payer: Self-pay | Admitting: Physical Therapy

## 2024-11-07 ENCOUNTER — Ambulatory Visit
Admission: RE | Admit: 2024-11-07 | Discharge: 2024-11-07 | Disposition: A | Discharge status: Home / Self Care | Attending: Family Medicine | Admitting: Family Medicine

## 2024-11-07 ENCOUNTER — Ambulatory Visit

## 2024-11-07 VITALS — BP 138/85 | HR 107 | Temp 97.9°F | Resp 18

## 2024-11-07 DIAGNOSIS — R0602 Shortness of breath: Secondary | ICD-10-CM | POA: Diagnosis not present

## 2024-11-07 DIAGNOSIS — R5383 Other fatigue: Secondary | ICD-10-CM | POA: Diagnosis not present

## 2024-11-07 DIAGNOSIS — R059 Cough, unspecified: Secondary | ICD-10-CM | POA: Diagnosis not present

## 2024-11-07 DIAGNOSIS — J069 Acute upper respiratory infection, unspecified: Secondary | ICD-10-CM

## 2024-11-07 MED ORDER — AMOXICILLIN-POT CLAVULANATE 875-125 MG PO TABS: 1.0000 | ORAL_TABLET | Freq: Two times a day (BID) | ORAL | 0 refills | Status: DC

## 2024-11-07 MED ORDER — PREDNISONE 20 MG PO TABS: ORAL_TABLET | ORAL | 0 refills | Status: DC

## 2024-11-07 MED ORDER — HYDROCODONE BIT-HOMATROP MBR 5-1.5 MG/5ML PO SOLN: 5.0000 mL | Freq: Four times a day (QID) | ORAL | 0 refills | Status: DC | PRN

## 2024-11-07 NOTE — Discharge Instructions (Addendum)
 Advised patient take medications as directed with food to completion.  Advised to take prednisone  with first dose of Augmentin  for the next 5 to 7 days.  Advised may use Hycodan cough syrup at night for cough prior to sleep due to sedative effects.  Encouraged increase daily water intake to 64 ounces per day while taking these medications.  Advised if symptoms worsen and/or unresolved please follow-up with your PCP or here for further evaluation.

## 2024-11-07 NOTE — ED Provider Notes (Signed)
 Betty Long [facility]    CSN: [id number] Arrival date & time: 11/07/24  9061      History   Chief Complaint Chief Complaint  Patient presents with   URI   Cough   Shortness of Breath    HPI Betty Long is a 38 y.o. female.   HPI Pleasant 38 year old female presents with continued cough for 5 days.  Patient was evaluated/treated here on 11/05/2024.  Please see epic for encounter note.  Patient reports shortness of breath fever of 102.0 yesterday, and chest discomfort with burning.  Please see epic for that encounter note.  PMH significant for arrhythmia, herpes simplex, and dyspnea on exertion.  Past Medical History:  Diagnosis Date   Arrhythmia 05/2024   PVC   Family history of breast cancer    Family history of melanoma    History of abnormal cervical Pap smear    ASCUS  ---  NEGATIVE HPV   Mass of left knee    Vaginal Pap smear, abnormal     Patient Active Problem List   Diagnosis Date Noted   Macular eruption 09/20/2024   Herpes simplex without complication 09/20/2024   Palpitations 09/20/2024   Dyspnea on exertion 09/20/2024   Dense breast tissue 06/28/2024   At increased risk of breast cancer 05/23/2024   Tubular adenoma of left female breast 05/05/2024   Genetic testing 07/07/2021   Family history of breast cancer 06/21/2021   Family history of melanoma 06/21/2021   Indication for care in labor or delivery 10/22/2017   Indication for care in labor and delivery, antepartum 01/26/2015   NSVD (normal spontaneous vaginal delivery) 01/26/2015   S/P knee surgery 02/09/2014   Contraception management 05/12/2012   ASCUS on Pap smear 05/12/2012    Past Surgical History:  Procedure Laterality Date   ANTERIOR CRUCIATE LIGAMENT REPAIR Left 2010   BREAST BIOPSY Left 03/23/2024   US  LT BREAST BX W LOC DEV 1ST LESION IMG BX SPEC US  GUIDE 03/23/2024 [facility]   BREAST BIOPSY Left 06/15/2024   US  LT BREAST BX W LOC DEV 1ST LESION IMG BX SPEC US  GUIDE  06/15/2024 [facility]   BREAST BIOPSY Left 06/15/2024   MM LT BREAST BX W LOC DEV 1ST LESION IMAGE BX SPEC STEREO GUIDE 06/15/2024 [facility]   COSMETIC SURGERY     laceration repair   KNEE ARTHROSCOPY Left 02/09/2014   Procedure: LEFT KNEE EXCISIONAL BIOPSY;  Surgeon: Lamar Collet, MD;  Location: [facility];  Service: Orthopedics;  Laterality: Left;   KNEE ARTHROSCOPY W/ LATERAL RELEASE Left 09-16-2011   PATELLA MALTRACKING   KNEE ARTHROSCOPY WITH MEDIAL MENISECTOMY Right 12/24/2023   Procedure: KNEE ARTHROSCOPY WITH MEDIAL MENISECTOMY;  Surgeon: Cristy Bonner DASEN, MD;  Location: [facility];  Service: Orthopedics;  Laterality: Right;   MYRINGOTOMY     3mos 6 mos   TONSILLECTOMY  1993    OB History     Gravida  2   Para  2   Term  2   Preterm  0   AB  0   Living  2      SAB  0   IAB  0   Ectopic  0   Multiple  0   Live Births  2            Home Medications    Prior to Admission medications   Medication Sig Start Date End Date Taking? Authorizing Provider  amoxicillin -clavulanate (AUGMENTIN ) 875-125 MG tablet  Take 1 tablet by mouth every 12 (twelve) hours. 11/07/24  Yes Teddy Sharper, FNP  HYDROcodone  bit-homatropine (HYCODAN) 5-1.5 MG/5ML syrup Take 5 mLs by mouth every 6 (six) hours as needed for cough. 11/07/24  Yes Teddy Sharper, FNP  predniSONE  (DELTASONE ) 20 MG tablet Take 3 tabs PO daily x 5 days. 11/07/24  Yes Teddy Sharper, FNP  metoprolol  tartrate (LOPRESSOR ) 25 MG tablet Take ONE-HALF tablets (12.5 mg total) by mouth 2 (two) times daily. 09/20/24 01/01/25  Bernie Lamar PARAS, MD    Family History Family History  Problem Relation Age of Onset   Anemia Mother    Hypertension Father    Melanoma Father 22       ear   Hyperlipidemia Father    Hypertension Brother    Cancer Maternal Aunt    Melanoma Maternal Aunt    Breast cancer Maternal Grandmother 36   Hyperlipidemia Maternal Grandmother    Melanoma  Maternal Grandfather        dx 50s/60s, metastatic   Cancer Maternal Grandfather    Hearing loss Maternal Grandfather     Social History Social History   Tobacco Use   Smoking status: Never   Smokeless tobacco: Never  Vaping Use   Vaping status: Never Used  Substance Use Topics   Alcohol use: No   Drug use: No     Allergies   Patient has no known allergies.   Review of Systems Review of Systems  Constitutional:  Positive for fatigue and fever.  HENT:  Positive for congestion.   Respiratory:  Positive for cough and shortness of breath.   All other systems reviewed and are negative.    Physical Exam Triage Vital Signs ED Triage Vitals  Encounter Vitals Group     BP 11/07/24 0944 138/85     Girls Systolic BP Percentile --      Girls Diastolic BP Percentile --      Boys Systolic BP Percentile --      Boys Diastolic BP Percentile --      Pulse Rate 11/07/24 0944 (!) 107     Resp 11/07/24 0944 18     Temp 11/07/24 0944 97.9 F (36.6 C)     Temp Source 11/07/24 0944 Oral     SpO2 11/07/24 0944 97 %     Weight --      Height --      Head Circumference --      Peak Flow --      Pain Score 11/07/24 0945 5     Pain Loc --      Pain Education --      Exclude from Growth Chart --    No data found.  Updated Vital Signs BP 138/85 (BP Location: Right Arm)   Pulse (!) 107   Temp 97.9 F (36.6 C) (Oral)   Resp 18   LMP 10/17/2024   SpO2 97%    Physical Exam Vitals and nursing note reviewed.  Constitutional:      General: She is not in acute distress.    Appearance: Normal appearance. She is normal weight. She is ill-appearing.  HENT:     Head: Normocephalic and atraumatic.     Right Ear: Tympanic membrane, ear canal and external ear normal.     Left Ear: Tympanic membrane, ear canal and external ear normal.     Mouth/Throat:     Mouth: Mucous membranes are moist.     Pharynx: Oropharynx is clear.  Comments: Moderate amount of clear drainage of  posterior oropharynx noted Eyes:     Extraocular Movements: Extraocular movements intact.     Conjunctiva/sclera: Conjunctivae normal.     Pupils: Pupils are equal, round, and reactive to light.  Cardiovascular:     Rate and Rhythm: Normal rate and regular rhythm.     Heart sounds: Normal heart sounds.  Pulmonary:     Effort: Pulmonary effort is normal.     Breath sounds: No wheezing, rhonchi or rales.     Comments: Diminished breath sounds noted throughout with frequent nonproductive/productive cough Musculoskeletal:        General: Normal range of motion.  Skin:    General: Skin is warm and dry.  Neurological:     General: No focal deficit present.     Mental Status: She is alert and oriented to person, place, and time. Mental status is at baseline.  Psychiatric:        Mood and Affect: Mood normal.        Behavior: Behavior normal.      UC Treatments / Results  Labs (all labs ordered are listed, but only abnormal results are displayed) Labs Reviewed - No data to display  EKG   Radiology DG Chest 2 View Result Date: 11/07/2024 CLINICAL DATA:  Worsening cough for 5 days with fatigue and shortness of breath. EXAM: CHEST - 2 VIEW COMPARISON:  Radiographs 06/08/2024 FINDINGS: The heart size and mediastinal contours are normal. The lungs are clear. There is no pleural effusion or pneumothorax. No acute osseous findings are identified. IMPRESSION: No active cardiopulmonary process. Electronically Signed   By: Elsie Perone M.D.   On: 11/07/2024 11:49    Procedures Procedures (including critical care time)  Medications Ordered in UC Medications - No data to display  Initial Impression / Assessment and Plan / UC Course  I have reviewed the triage vital signs and the nursing notes.  Pertinent labs & imaging results that were available during my care of the patient were reviewed by me and considered in my medical decision making (see chart for details).     MDM: 1.   Acute URI-Rx'd Augmentin  875/125 mg tablet: Take 1 tablet twice daily x 7 days; 2.  Cough, unspecified type-CXR revealed above, patient advised, Rx'd prednisone  20 mg tablet: Take 3 tablets p.o. daily x 5 days, Rx'd Hycodan 5-1.5 mg / 5 mL: Take 5 mL every 6 hours, as needed for cough. Advised patient take medications as directed with food to completion.  Advised to take prednisone  with first dose of Augmentin  for the next 5 to 7 days.  Advised may use Hycodan cough syrup at night for cough prior to sleep due to sedative effects.  Encouraged increase daily water intake to 64 ounces per day while taking these medications.  Advised if symptoms worsen and/or unresolved please follow-up with your PCP or here for further evaluation.  Work note provided to patient prior to discharge.  Patient discharged home, hemodynamically stable. Final Clinical Impressions(s) / UC Diagnoses   Final diagnoses:  Cough, unspecified type  Acute URI     Discharge Instructions      Advised patient take medications as directed with food to completion.  Advised to take prednisone  with first dose of Augmentin  for the next 5 to 7 days.  Advised may use Hycodan cough syrup at night for cough prior to sleep due to sedative effects.  Encouraged increase daily water intake to 64 ounces per day while taking  these medications.  Advised if symptoms worsen and/or unresolved please follow-up with your PCP or here for further evaluation.     ED Prescriptions     Medication Sig Dispense Auth. Provider   amoxicillin -clavulanate (AUGMENTIN ) 875-125 MG tablet Take 1 tablet by mouth every 12 (twelve) hours. 14 tablet Cailie Bosshart, FNP   predniSONE  (DELTASONE ) 20 MG tablet Take 3 tabs PO daily x 5 days. 15 tablet Laniya Friedl, FNP   HYDROcodone  bit-homatropine (HYCODAN) 5-1.5 MG/5ML syrup Take 5 mLs by mouth every 6 (six) hours as needed for cough. 120 mL Teddy Sharper, FNP      I have reviewed the PDMP during this encounter.    Teddy Sharper, FNP 11/07/24 1221

## 2024-11-07 NOTE — ED Triage Notes (Signed)
 Pt c/o cough, shortness of breath, fever and chest discomfort/burning since Thurs. Fever 102 yesterday. Taking flonase, mucinex, tylenol /advil  prn. Also had an old zpak prescription which she will finish tonight. Was seen here in UC on 12/6, neg for flu and covid.

## 2024-11-07 NOTE — ED Notes (Signed)
Pt walked to xray

## 2024-11-16 ENCOUNTER — Encounter (HOSPITAL_BASED_OUTPATIENT_CLINIC_OR_DEPARTMENT_OTHER): Payer: Self-pay | Admitting: Physical Therapy

## 2024-11-16 ENCOUNTER — Ambulatory Visit (HOSPITAL_BASED_OUTPATIENT_CLINIC_OR_DEPARTMENT_OTHER): Admitting: Physical Therapy

## 2024-11-16 DIAGNOSIS — M25561 Pain in right knee: Secondary | ICD-10-CM | POA: Diagnosis not present

## 2024-11-16 DIAGNOSIS — M6281 Muscle weakness (generalized): Secondary | ICD-10-CM

## 2024-11-16 DIAGNOSIS — R262 Difficulty in walking, not elsewhere classified: Secondary | ICD-10-CM

## 2024-11-16 NOTE — Therapy (Signed)
 OUTPATIENT PHYSICAL THERAPY TREATMENT Progress Note Reporting Period 01/01/2024 to 03/26/2024  See note below for Objective Data and Assessment of Progress/Goals.      Patient Name: Betty Long [id number] MRN: [id number] DOB:[birth year], 38 y.o., female Today's Date: 11/16/2024  END OF SESSION:  PT End of Session - 11/16/24 1524     Visit Number 28    Number of Visits 41    Date for Recertification  12/02/24    Authorization Type MC    PT Start Time 1515    PT Stop Time 1557    PT Time Calculation (min) 42 min    Activity Tolerance Patient tolerated treatment well;No increased pain    Behavior During Therapy WFL for tasks assessed/performed                   Past Medical History:  Diagnosis Date   Arrhythmia 05/2024   PVC   Family history of breast cancer    Family history of melanoma    History of abnormal cervical Pap smear    ASCUS  ---  NEGATIVE HPV   Mass of left knee    Vaginal Pap smear, abnormal    Past Surgical History:  Procedure Laterality Date   ANTERIOR CRUCIATE LIGAMENT REPAIR Left 2010   BREAST BIOPSY Left 03/23/2024   US  LT BREAST BX W LOC DEV 1ST LESION IMG BX SPEC US  GUIDE 03/23/2024 [facility]   BREAST BIOPSY Left 06/15/2024   US  LT BREAST BX W LOC DEV 1ST LESION IMG BX SPEC US  GUIDE 06/15/2024 [facility]   BREAST BIOPSY Left 06/15/2024   MM LT BREAST BX W LOC DEV 1ST LESION IMAGE BX SPEC STEREO GUIDE 06/15/2024 [facility]   COSMETIC SURGERY     laceration repair   KNEE ARTHROSCOPY Left 02/09/2014   Procedure: LEFT KNEE EXCISIONAL BIOPSY;  Surgeon: Lamar Collet, MD;  Location: [facility] [place];  Service: Orthopedics;  Laterality: Left;   KNEE ARTHROSCOPY W/ LATERAL RELEASE Left 09-16-2011   PATELLA MALTRACKING   KNEE ARTHROSCOPY WITH MEDIAL MENISECTOMY Right 12/24/2023   Procedure: KNEE ARTHROSCOPY WITH MEDIAL MENISECTOMY;  Surgeon: Cristy Bonner DASEN, MD;  Location: [facility];  Service:  Orthopedics;  Laterality: Right;   MYRINGOTOMY     3mos 6 mos   TONSILLECTOMY  1993   Patient Active Problem List   Diagnosis Date Noted   Macular eruption 09/20/2024   Herpes simplex without complication 09/20/2024   Palpitations 09/20/2024   Dyspnea on exertion 09/20/2024   Dense breast tissue 06/28/2024   At increased risk of breast cancer 05/23/2024   Tubular adenoma of left female breast 05/05/2024   Genetic testing 07/07/2021   Family history of breast cancer 06/21/2021   Family history of melanoma 06/21/2021   Indication for care in labor or delivery 10/22/2017   Indication for care in labor and delivery, antepartum 01/26/2015   NSVD (normal spontaneous vaginal delivery) 01/26/2015   S/P knee surgery 02/09/2014   Contraception management 05/12/2012   ASCUS on Pap smear 05/12/2012    REFERRING PROVIDER:  Cristy Bonner DASEN, MD     REFERRING DIAG: S/P right medial and lateral meniscus repair,ACL reconstruction   Rationale for Evaluation and Treatment: Rehabilitation  THERAPY DIAG:  Acute pain of right knee  Muscle weakness (generalized)  Difficulty in walking, not elsewhere classified  ONSET DATE: DOS 12/24/23   SUBJECTIVE:  SUBJECTIVE STATEMENT: The patient has started playing basketball with her kids. She can notice it when she goes back into full extension.   PERTINENT HISTORY:  Lt ACLR and lateral release  PAIN:  Are you having pain? Yes: NPRS scale: 4/10 Pain location: Rt knee Pain description: uncomfortable, tight Aggravating factors: constant Relieving factors: ice  PRECAUTIONS:  Other: see WB precautions  RED FLAGS: None   WEIGHT BEARING RESTRICTIONS:  Yes through 2/20: WBAT 0-90, ROM 0-90, no tibial rotation   FALLS:  Has patient fallen in last 6 months?  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  PATIENT SURVEYS:  LEFS 15  5/2: LEFS 50 COGNITIVE STATUS: Within functional limits for tasks assessed   SENSATION: WFL  EDEMA:  Yes: moderate  GAIT: EVAL: with bil axillary crutches   Body Part #1 Knee  PALPATION: EVAL: Good patellar mobility, no s/s of infection  LOWER EXTREMITY ROM:     Passive  Right eval Right  2/4  3/3 3/6 3/10 3/27 4/7 R 5/2 L 5/2 R  Hip flexion             Hip extension             Hip abduction             Hip adduction             Hip internal rotation             Hip external rotation             Knee flexion 40 58 74 100 106 103  107 110.5 124 143 130  Knee extension -3 -2 -4  3 3   0  -3 0 Started with 3 degrees away from full extension.  With manual therapy was able to get the patient to full extension.   (Blank rows = not tested)   LOWER EXTREMITY MMT:    MMT Right 5/2 Left 5/2  Hip flexion    Hip extension    Hip abduction    Hip adduction    Hip internal rotation    Hip external rotation    Knee flexion 33.2 32.0  Knee extension 37.3 39.1  Ankle dorsiflexion    Ankle plantarflexion    Ankle inversion    Ankle eversion     (Blank rows = not tested)                                                    Tinq Last eval  L 90: 63.2 L 45   63.0  R: 90 64.9 R 45: 68.6  11/6 L 90: 59.3 L 45   56.7  R: 90 69.9 R 45: 58.5  TREATMENT DATE:  12/2 There-ex:  Elliptical 5 min   Manual: Manual: Extension stretch with distraction  Hamstring release  Grade II and III Posterior glides with rotation   Neuro-re-ed:  Drop jump 4 inch 3 RPE x10  8 inch 5 RPE  2x10  Cone drill 2x10     Gait:  Cutting left and right  Box drill (shuffle -> back pedal -> shuffle -> sprint) 4x  Accelerate to decelerate 6x Side shuffle to push off  6 laps    11/7 Therapeutic exercise: Tindq testing   Leg press Cybex:  90 pounds x 12 RPE of 3 110 pounds x 12 RPE of 4 130 pounds RPE of 5  [employer] knee extension machine 15 pounds can feel minor pain in the area that she has pain. 3 x 12 no increase in pain as she perform the activity.  Manual: Extension stretch with distraction  Hamstring release  Grade II and III Posterior glides with rotation     6/9 There-act Side shuffle 20'x4 Side shuffle with direction change 20'x4 Change of speed with running 4 laps  Neuro-re-ed  Hop over hurdle 2x10  Side to side hop x20  Fwd back hop on air-ex x20   There-ex:  Cybex leg press 90, 110 ,130 x12 each   LF leg extension 25 lbs 3x12     6/3  There-ex:  Elliptical L3 5 min   There-act Side shuffle 20'x4 Side shuffle with direction change 20'x4 Low skip 20'x4 [place] step 20'x4  Neuro-re-ed  Cable walk fwd and back 15 lbs 10x each   TRX squats 3x10   Goblet squats 2x15 15 lbs in the mirror.    5/28 Manual:  PROM ext mobs grade 3 PROM flex gapping grade 3 Trigger point release to the anterior hip    There-act Side shuffle 20'x4 Side shuffle with direction change 20'x4 Low skip 20'x4 [place] step 20'x4  Neuro-re-ed  Quick step  Side to side hop  Fwd back hop   Squat jump  Bounding   There-ex:   SLR 3x10 2.5 lbs Ex bike 5 min L8    5/22  There-ex: strength testing with tindq . Reviewed goals   Upright bike 5 min   Cybex leg press 90, 110 ,130 x12 each   LF leg extension 25 lbs 3x12   Gait: reviewed return to running ideas   5/16 Manual:  PROM ext mobs grade 3 PROM flex gapping grade 3 Trigger point release to the anterior hip   There-ex Dead left 26 lbs KB from 4 inch  Upright bike 5 min     Neuro re-ed:  TRX squat 3x10  Goblet squat watching posture in mirror 3x12 15 lbs  Eccentric strep down 4 inch 3x10  Step onto and off air-ex 2x10 fwd and lateral       5/9 Manual: All PROM performed with distraction to reduce pain and improve movement PROM ext mobs grade 3 PROM flex gapping grade 3 There-ex: Nustep lvl 5 SLR 2x10 4lbs Leg press 130lbs (next time do 150)    PATIENT EDUCATION:  Education details: Anatomy of condition, POC, HEP, exercise form/rationale Person educated: Patient Education method: Explanation, Demonstration, Tactile cues, Verbal cues,  Education comprehension: verbalized understanding, returned demonstration, verbal cues required, tactile cues required, and needs further education  HOME EXERCISE PROGRAM: Access Code: [email or web address] URL: [email or web address]    ASSESSMENT:  CLINICAL IMPRESSION: Therapy performed baseline running and cutting drills with the patient today.  She reported decreased confidence but no pain. She was given for home. We continues to work on her extension. After manual therapy she was able to get extension to the table but she is still lacking knee hyper extension.  REHAB POTENTIAL: Good  CLINICAL DECISION MAKING: Stable/uncomplicated  EVALUATION COMPLEXITY: Low   GOALS: Goals reviewed with patient? 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  PLAN FOR NEXT SESSION:  Begin  sportmetric jumping program. Review running/cutting training  Alm JINNY Don, PT 11/16/2024 3:30 PM  11/16/2024 3:30 PM [facility] [address] [place], [place], [address] Phone: [phone]   Fax:  [phone]  `

## 2024-11-17 ENCOUNTER — Encounter (HOSPITAL_BASED_OUTPATIENT_CLINIC_OR_DEPARTMENT_OTHER): Payer: Self-pay | Admitting: Physical Therapy

## 2024-11-30 ENCOUNTER — Encounter (HOSPITAL_BASED_OUTPATIENT_CLINIC_OR_DEPARTMENT_OTHER): Payer: Self-pay | Admitting: Physical Therapy

## 2024-11-30 ENCOUNTER — Ambulatory Visit (HOSPITAL_BASED_OUTPATIENT_CLINIC_OR_DEPARTMENT_OTHER): Admitting: Physical Therapy

## 2024-11-30 DIAGNOSIS — M6281 Muscle weakness (generalized): Secondary | ICD-10-CM

## 2024-11-30 DIAGNOSIS — R262 Difficulty in walking, not elsewhere classified: Secondary | ICD-10-CM

## 2024-11-30 DIAGNOSIS — M25561 Pain in right knee: Secondary | ICD-10-CM | POA: Diagnosis not present

## 2024-11-30 NOTE — Therapy (Signed)
 " OUTPATIENT PHYSICAL THERAPY TREATMENT Progress Note Reporting Period 01/01/2024 to 03/26/2024  See note below for Objective Data and Assessment of Progress/Goals.      Patient Name: Betty Long MRN: [id number] DOB:[birth year], 38 y.o., female Today's Date: 11/30/2024  END OF SESSION:             Past Medical History:  Diagnosis Date   Arrhythmia 05/2024   PVC   Family history of breast cancer    Family history of melanoma    History of abnormal cervical Pap smear    ASCUS  ---  NEGATIVE HPV   Mass of left knee    Vaginal Pap smear, abnormal    Past Surgical History:  Procedure Laterality Date   ANTERIOR CRUCIATE LIGAMENT REPAIR Left 2010   BREAST BIOPSY Left 03/23/2024   US  LT BREAST BX W LOC DEV 1ST LESION IMG BX SPEC US  GUIDE 03/23/2024 [facility]   BREAST BIOPSY Left 06/15/2024   US  LT BREAST BX W LOC DEV 1ST LESION IMG BX SPEC US  GUIDE 06/15/2024 [facility]   BREAST BIOPSY Left 06/15/2024   MM LT BREAST BX W LOC DEV 1ST LESION IMAGE BX SPEC STEREO GUIDE 06/15/2024 [facility]   COSMETIC SURGERY     laceration repair   KNEE ARTHROSCOPY Left 02/09/2014   Procedure: LEFT KNEE EXCISIONAL BIOPSY;  Surgeon: Lamar Collet, MD;  Location: [facility];  Service: Orthopedics;  Laterality: Left;   KNEE ARTHROSCOPY W/ LATERAL RELEASE Left 09-16-2011   PATELLA MALTRACKING   KNEE ARTHROSCOPY WITH MEDIAL MENISECTOMY Right 12/24/2023   Procedure: KNEE ARTHROSCOPY WITH MEDIAL MENISECTOMY;  Surgeon: Cristy Bonner DASEN, MD;  Location: [facility];  Service: Orthopedics;  Laterality: Right;   MYRINGOTOMY     3mos 6 mos   TONSILLECTOMY  1993   Patient Active Problem List   Diagnosis Date Noted   Macular eruption 09/20/2024   Herpes simplex without complication 09/20/2024   Palpitations 09/20/2024   Dyspnea on exertion 09/20/2024   Dense breast tissue 06/28/2024   At increased risk of breast cancer 05/23/2024   Tubular  adenoma of left female breast 05/05/2024   Genetic testing 07/07/2021   Family history of breast cancer 06/21/2021   Family history of melanoma 06/21/2021   Indication for care in labor or delivery 10/22/2017   Indication for care in labor and delivery, antepartum 01/26/2015   NSVD (normal spontaneous vaginal delivery) 01/26/2015   S/P knee surgery 02/09/2014   Contraception management 05/12/2012   ASCUS on Pap smear 05/12/2012    REFERRING PROVIDER:  Cristy Bonner DASEN, MD     REFERRING DIAG: S/P right medial and lateral meniscus repair,ACL reconstruction   Rationale for Evaluation and Treatment: Rehabilitation  THERAPY DIAG:  No diagnosis found.  ONSET DATE: DOS 12/24/23   SUBJECTIVE:  SUBJECTIVE STATEMENT: The patient continues to report medial soreness. She reports it has been about the same. She thinks her shoes may have been contributing.     PERTINENT HISTORY:  Lt ACLR and lateral release  PAIN:  Are you having pain? Yes: NPRS scale: 4/10 Pain location: Rt knee Pain description: uncomfortable, tight Aggravating factors: constant Relieving factors: ice  PRECAUTIONS:  Other: see WB precautions  RED FLAGS: None   WEIGHT BEARING RESTRICTIONS:  Yes through 2/20: WBAT 0-90, ROM 0-90, no tibial rotation   FALLS:  Has patient fallen in last 6 months? 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  PATIENT SURVEYS:  LEFS 15  5/2: LEFS 50 COGNITIVE STATUS: Within functional limits for tasks assessed   SENSATION: WFL  EDEMA:  Yes:  moderate  GAIT: EVAL: with bil axillary crutches   Body Part #1 Knee  PALPATION: EVAL: Good patellar mobility, no s/s of infection  LOWER EXTREMITY ROM:     Passive  Right eval Right  2/4  3/3 3/6 3/10 3/27 4/7 R 5/2 L 5/2 R  Hip flexion             Hip extension             Hip abduction             Hip adduction             Hip internal rotation             Hip external rotation             Knee flexion 40 58 74 100 106 103  107 110.5 124 143 130  Knee extension -3 -2 -4  3 3   0  -3 0 Started with 3 degrees away from full extension.  With manual therapy was able to get the patient to full extension.   (Blank rows = not tested)   LOWER EXTREMITY MMT:    MMT Right 5/2 Left 5/2  Hip flexion    Hip extension    Hip abduction    Hip adduction    Hip internal rotation    Hip external rotation    Knee flexion 33.2 32.0  Knee extension 37.3 39.1  Ankle dorsiflexion    Ankle plantarflexion    Ankle inversion    Ankle eversion     (Blank rows = not tested)                                                    Tinq Last eval  L 90: 63.2 L 45   63.0  R: 90 64.9 R 45: 68.6  11/6 L 90: 59.3 L 45   56.7  R: 90 69.9 R 45: 58.5  TREATMENT DATE:  12/31  Manual: Manual: Extension stretch with distraction  Hamstring release  Grade II and III Posterior glides with rotation  STM to pes anserine area  Review of self STM to pes   There-ex:  SLR 3x12 2 lb  SAQ 3x12 2 lbs   Neuro-re-ed:  Reviewed single leg hop training for preporation for single leg hop test. Not reped 2nd to baseline pain and what she is doing after treatment.  Hop onto air-ex fwd and lateral 2x15    Gait: Side shuffle 2 laps Low skip 2 laps  Side shuffle -> switch  Bad pedal to switch    12/17 Therapeutic exercise: Leg press 3 x 12 150 pounds LF Knee extension 20 pounds 3 x 12 Exercise bike 6  minutes  Neuromuscular reeducation 8 inch step and drive 3 x 12 6 inch heel touch 3 x 12 Hop on Airex is off for the lateral 3 x 12 Cable walk forward and back 20 pounds x 10 each    12/2 There-ex:  Elliptical 5 min   Manual: Manual: Extension stretch with distraction  Hamstring release  Grade II and III Posterior glides with rotation   Neuro-re-ed:  Drop jump 4 inch 3 RPE x10  8 inch 5 RPE 2x10  Cone drill 2x10     Gait:  Cutting left and right  Box drill (shuffle -> back pedal -> shuffle -> sprint) 4x  Accelerate to decelerate 6x Side shuffle to push off  6 laps    11/7 Therapeutic exercise: Tindq testing   Leg press Cybex:  90 pounds x 12 RPE of 3 110 pounds x 12 RPE of 4 130 pounds RPE of 5  Life fitness knee extension machine 15 pounds can feel minor pain in the area that she has pain. 3 x 12 no increase in pain as she perform the activity.  Manual: Extension stretch with distraction  Hamstring release  Grade II and III Posterior glides with rotation     6/9 There-act Side shuffle 20'x4 Side shuffle with direction change 20'x4 Change of speed with running 4 laps  Neuro-re-ed  Hop over hurdle 2x10  Side to side hop x20  Fwd back hop on air-ex x20   There-ex:  Cybex leg press 90, 110 ,130 x12 each   LF leg extension 25 lbs 3x12     6/3  There-ex:  Elliptical L3 5 min   There-act Side shuffle 20'x4 Side shuffle with direction change 20'x4 Low skip 20'x4 [place] step 20'x4  Neuro-re-ed  Cable walk fwd and back 15 lbs 10x each   TRX squats 3x10   Goblet squats 2x15 15 lbs in the mirror.    5/28 Manual:  PROM ext mobs grade 3 PROM flex gapping grade 3 Trigger point release to the anterior hip    There-act Side shuffle 20'x4 Side shuffle with direction change 20'x4 Low skip 20'x4 [place] step 20'x4  Neuro-re-ed  Quick step  Side to side hop  Fwd back hop   Squat jump  Bounding   There-ex:   SLR 3x10 2.5  lbs Ex bike 5 min L8    5/22  There-ex: strength testing with tindq . Reviewed goals   Upright bike 5 min   Cybex leg press 90, 110 ,130 x12 each   LF leg extension 25 lbs 3x12   Gait: reviewed return to running ideas   5/16 Manual:  PROM ext mobs grade 3 PROM flex gapping grade 3 Trigger point release to  the anterior hip   There-ex Dead left 26 lbs KB from 4 inch  Upright bike 5 min    Neuro re-ed:  TRX squat 3x10  Goblet squat watching posture in mirror 3x12 15 lbs  Eccentric strep down 4 inch 3x10  Step onto and off air-ex 2x10 fwd and lateral       5/9 Manual: All PROM performed with distraction to reduce pain and improve movement PROM ext mobs grade 3 PROM flex gapping grade 3 There-ex: Nustep lvl 5 SLR 2x10 4lbs 0Leg press 130lbs (next time do 150)    PATIENT EDUCATION:  Education details: Anatomy of condition, POC, HEP, exercise form/rationale Person educated: Patient Education method: Explanation, Demonstration, Tactile cues, Verbal cues,  Education comprehension: verbalized understanding, returned demonstration, verbal cues required, tactile cues required, and needs further education  HOME EXERCISE PROGRAM: Access Code: [email or web address] URL: [email or web address]    ASSESSMENT:  CLINICAL IMPRESSION: ' The patient will be going on a long ride and vacation with a lot of walking following therapy. We still reviewed cutting focusing more on technique than reps today. She was given single leg hopping to work on at home. We also worked on manual therapy. It seems like she could have pes bursitis at this time. We reviewed self stretching for pes bursitis including hamstring and gastroc.     REHAB POTENTIAL: Good  CLINICAL DECISION MAKING: Stable/uncomplicated  EVALUATION COMPLEXITY: Low   GOALS: Goals reviewed with patient? 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  PLAN FOR NEXT SESSION:  Begin  sportmetric jumping program. Review running/cutting training  Alm JINNY Don, PT 11/30/2024 7:18 AM  11/30/2024 7:18 AM [facility] [address] [place], [place], [address] Phone: [phone]   Fax:  [phone]  ` "

## 2024-12-22 ENCOUNTER — Telehealth: Payer: Self-pay | Admitting: Hematology and Oncology

## 2024-12-22 NOTE — Telephone Encounter (Signed)
 I spoke with patient to reschedule her 12/23/2024 MD appointment to 01/18/2025 per her request due to work schedule. Patient aware of date/time.

## 2024-12-23 ENCOUNTER — Inpatient Hospital Stay: Admitting: Hematology and Oncology

## 2024-12-28 ENCOUNTER — Encounter: Payer: Self-pay | Admitting: Cardiology

## 2024-12-28 ENCOUNTER — Other Ambulatory Visit (HOSPITAL_BASED_OUTPATIENT_CLINIC_OR_DEPARTMENT_OTHER): Payer: Self-pay

## 2024-12-28 ENCOUNTER — Ambulatory Visit: Attending: Cardiology | Admitting: Cardiology

## 2024-12-28 VITALS — BP 92/64 | HR 84 | Ht 68.0 in | Wt 153.0 lb

## 2024-12-28 DIAGNOSIS — R002 Palpitations: Secondary | ICD-10-CM | POA: Diagnosis not present

## 2024-12-28 DIAGNOSIS — I4729 Other ventricular tachycardia: Secondary | ICD-10-CM | POA: Diagnosis not present

## 2024-12-28 MED ORDER — METOPROLOL SUCCINATE ER 50 MG PO TB24
50.0000 mg | ORAL_TABLET | Freq: Every day | ORAL | 3 refills | Status: AC
  Filled 2024-12-28: qty 90, 90d supply, fill #0

## 2024-12-28 NOTE — Progress Notes (Signed)
 " Cardiology Office Note:    Date:  12/28/2024   ID:  Ronald Vinsant, DOB 19-Aug-1986, MRN [id number]  PCP:  Jason Leita Repine, FNP (Inactive)  Cardiologist:  Lamar Fitch, MD    Referring MD: Fitch Lamar PARAS, MD   Chief Complaint  Patient presents with   Follow-up    History of Present Illness:     Betty Long is a 39 y.o. female past medical history significant for palpitations, investigation so far included echocardiogram showing preserved left ventricle ejection fraction, she did wear a monitor which showed 1 run of nonsustained ventricular tachycardia symptomatic.  That being managed with beta-blocker.  She described however the fact that lately palpitation became [place] more frequent and she actually increased dose of metoprolol  to 25 twice daily.  That seems to be controlling palpitations quite well  Past Medical History:  Diagnosis Date   Arrhythmia 05/2024   PVC   Family history of breast cancer    Family history of melanoma    History of abnormal cervical Pap smear    ASCUS  ---  NEGATIVE HPV   Mass of left knee    Vaginal Pap smear, abnormal     Past Surgical History:  Procedure Laterality Date   ANTERIOR CRUCIATE LIGAMENT REPAIR Left 2010   BREAST BIOPSY Left 03/23/2024   US  LT BREAST BX W LOC DEV 1ST LESION IMG BX SPEC US  GUIDE 03/23/2024 [facility]   BREAST BIOPSY Left 06/15/2024   US  LT BREAST BX W LOC DEV 1ST LESION IMG BX SPEC US  GUIDE 06/15/2024 [facility]   BREAST BIOPSY Left 06/15/2024   MM LT BREAST BX W LOC DEV 1ST LESION IMAGE BX SPEC STEREO GUIDE 06/15/2024 [facility]   COSMETIC SURGERY     laceration repair   KNEE ARTHROSCOPY Left 02/09/2014   Procedure: LEFT KNEE EXCISIONAL BIOPSY;  Surgeon: Lamar Collet, MD;  Location: [facility];  Service: Orthopedics;  Laterality: Left;   KNEE ARTHROSCOPY W/ LATERAL RELEASE Left 09-16-2011   PATELLA MALTRACKING   KNEE ARTHROSCOPY WITH MEDIAL  MENISECTOMY Right 12/24/2023   Procedure: KNEE ARTHROSCOPY WITH MEDIAL MENISECTOMY;  Surgeon: Cristy Bonner DASEN, MD;  Location: [facility];  Service: Orthopedics;  Laterality: Right;   MYRINGOTOMY     3mos 6 mos   TONSILLECTOMY  1993    Current Medications: Active Medications[1]   Allergies:   Patient has no known allergies.   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 :)  Other Topics Concern   Not on file  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[employer]   Social Drivers of Health   Tobacco Use: Low Risk (12/28/2024)   Patient History    Smoking Tobacco Use: Never    Smokeless Tobacco Use: Never    Passive Exposure: Not on file  Financial Resource Strain: Low Risk (06/15/2024)  Overall Financial Resource Strain (CARDIA)    Difficulty of Paying Living Expenses: Not hard at all  Food Insecurity: No Food Insecurity (06/15/2024)   Epic    Worried About Programme Researcher, Broadcasting/film/video in the Last Year: Never true    Ran Out of Food in the Last Year: Never true  Transportation Needs: No Transportation Needs (06/15/2024)   Epic    Lack of Transportation (Medical): No    Lack of Transportation (Non-Medical): No  Physical Activity: Insufficiently Active (06/15/2024)   Exercise Vital Sign    Days of Exercise per Week: 1 day    Minutes of Exercise per Session: 30 min  Stress: Stress Concern Present (08/09/2024)   Received from [facility]   [employer] - Occupational Stress Questionnaire    Feeling of Stress : To some extent  Social Connections: Socially Integrated (06/15/2024)   Social Connection and Isolation Panel    Frequency of Communication with Friends and Family: More than three times a week    Frequency of Social Gatherings with Friends and Family: Patient declined    Attends Religious Services: More than 4 times per year    Active Member of Clubs or Organizations: Yes    Attends Banker Meetings: More than 4 times per year    Marital Status: Married  Depression (PHQ2-9): Low Risk (05/23/2024)   Depression (PHQ2-9)    PHQ-2 Score: 0  Alcohol Screen: Not on file  Housing: Low Risk (06/15/2024)   Epic    Unable to Pay for Housing in the Last Year: No    Number of Times Moved in the Last Year: 0    Homeless in the Last Year: No  Utilities: Not At Risk (05/23/2024)   Epic    Threatened with loss of utilities: No  Health Literacy: Not on file     Family History: The patient's family history includes Anemia in her mother; Breast cancer (age of onset: 67) in her maternal grandmother; Cancer in her maternal aunt and maternal grandfather; Hearing loss in her maternal grandfather; Hyperlipidemia in her father and maternal grandmother; Hypertension in her brother and father; Melanoma in her maternal aunt and maternal grandfather; Melanoma (age of onset: 54) in her father. ROS:   Please see the history of present illness.    All 14 point review of systems negative except as described per history of present illness  EKGs/Labs/Other Studies Reviewed:         Recent Labs: 06/08/2024: BUN 11; Creatinine, Ser 0.76; Hemoglobin 14.1; Magnesium 2.1; Platelets 237; Potassium 4.0; Sodium 138; TSH 1.508  Recent Lipid Panel No results found for: CHOL, TRIG, HDL, CHOLHDL, VLDL, LDLCALC, LDLDIRECT  Physical Exam:    VS:  BP 92/64   Pulse 84   Ht 5' 8 (1.727 m)   Wt 153 lb (69.4 kg)    SpO2 94%   BMI 23.26 kg/m     Wt Readings from Last 3 Encounters:  12/28/24 153 lb (69.4 kg)  11/05/24 145 lb (65.8 kg)  09/20/24 152 lb (68.9 kg)     GEN:  Well nourished, well developed in no acute distress HEENT: Normal NECK: No JVD; No carotid bruits LYMPHATICS: No lymphadenopathy CARDIAC: RRR, no murmurs, no rubs, no gallops RESPIRATORY:  Clear to auscultation without rales, wheezing or rhonchi  ABDOMEN: Soft, non-tender, non-distended MUSCULOSKELETAL:  No edema; No deformity  SKIN: Warm and dry LOWER EXTREMITIES: no swelling NEUROLOGIC:  Alert and oriented x 3 PSYCHIATRIC:  Normal affect   ASSESSMENT:    1. Palpitations   2. Nonsustained ventricular tachycardia (HCC)    PLAN:    In order of problems listed above:  Palpitations, will put her on long-acting metoprolol  succinate 50 mg daily to see if we can suppress arrhythmia, I advised her to get Apple Watch and record her arrhythmia and send it to me if she record some abnormalities, if that will be unsuccessful then we will go with Zio patch again.  She did have echocardiogram showed preserved left ventricle ejection fraction overall low risks however I told her if she get dizzy passout or palpitation get worse she needs to let me know.  I also recommend to try some magnesium over-the-counter.   Medication Adjustments/Labs and Tests Ordered: Current medicines are reviewed at length with the patient today.  Concerns regarding medicines are outlined above.  No orders of the defined types were placed in this encounter.  Medication changes: No orders of the defined types were placed in this encounter.   Signed, Lamar DOROTHA Fitch, MD, [facility] 12/28/2024 9:31 AM    [facility]    [1]  Current Meds  Medication Sig   metoprolol  tartrate (LOPRESSOR ) 25 MG tablet Take ONE-HALF tablets (12.5 mg total) by mouth 2 (two) times daily.   "

## 2024-12-28 NOTE — Addendum Note (Signed)
 Addended by: ARLOA MALLORY D on: 12/28/2024 09:38 AM   Modules accepted: Orders

## 2024-12-28 NOTE — Patient Instructions (Addendum)
 Medication Instructions:   START: Metoprolol  Succinate 50mg  daily   Lab Work: None Ordered If you have labs (blood work) drawn today and your tests are completely normal, you will receive your results only by: MyChart Message (if you have MyChart) OR A paper copy in the mail If you have any lab test that is abnormal or we need to change your treatment, we will call you to review the results.   Testing/Procedures: None Ordered   Follow-Up: At [facility], you and your health needs are our priority.  As part of our continuing mission to provide you with exceptional heart care, we have created designated Provider Care Teams.  These Care Teams include your primary Cardiologist (physician) and Advanced Practice Providers (APPs -  Physician Assistants and Nurse Practitioners) who all work together to provide you with the care you need, when you need it.  We recommend signing up for the patient portal called MyChart.  Sign up information is provided on this After Visit Summary.  MyChart is used to connect with patients for Virtual Visits (Telemedicine).  Patients are able to view lab/test results, encounter notes, upcoming appointments, etc.  Non-urgent messages can be sent to your provider as well.   To learn more about what you can do with MyChart, go to [email or web address].    Your next appointment:   6 month(s)  The format for your next appointment:   In Person  Provider:   Lamar Fitch, MD    Other Instructions NA

## 2025-01-19 ENCOUNTER — Inpatient Hospital Stay: Admitting: Hematology and Oncology

## 2025-05-23 ENCOUNTER — Ambulatory Visit: Admitting: Hematology and Oncology
